# Patient Record
Sex: Male | Born: 1960
Health system: Southern US, Community
[De-identification: ages and names within clinical notes are randomized; demographics above are authoritative.]

## PROBLEM LIST (undated history)

## (undated) DIAGNOSIS — R630 Anorexia: Secondary | ICD-10-CM

## (undated) DIAGNOSIS — R350 Frequency of micturition: Secondary | ICD-10-CM

## (undated) DIAGNOSIS — E785 Hyperlipidemia, unspecified: Secondary | ICD-10-CM

## (undated) DIAGNOSIS — F329 Major depressive disorder, single episode, unspecified: Secondary | ICD-10-CM

## (undated) DIAGNOSIS — I739 Peripheral vascular disease, unspecified: Secondary | ICD-10-CM

## (undated) DIAGNOSIS — I639 Cerebral infarction, unspecified: Secondary | ICD-10-CM

## (undated) DIAGNOSIS — R0600 Dyspnea, unspecified: Secondary | ICD-10-CM

## (undated) DIAGNOSIS — M1990 Unspecified osteoarthritis, unspecified site: Secondary | ICD-10-CM

## (undated) DIAGNOSIS — R0609 Other forms of dyspnea: Secondary | ICD-10-CM

## (undated) DIAGNOSIS — I10 Essential (primary) hypertension: Secondary | ICD-10-CM

## (undated) DIAGNOSIS — R51 Headache: Secondary | ICD-10-CM

## (undated) DIAGNOSIS — H547 Unspecified visual loss: Secondary | ICD-10-CM

## (undated) DIAGNOSIS — E7800 Pure hypercholesterolemia, unspecified: Secondary | ICD-10-CM

## (undated) DIAGNOSIS — R519 Headache, unspecified: Secondary | ICD-10-CM

## (undated) DIAGNOSIS — F32A Depression, unspecified: Secondary | ICD-10-CM

## (undated) HISTORY — DX: Anorexia: R63.0

## (undated) HISTORY — DX: Headache: R51

## (undated) HISTORY — DX: Dyspnea, unspecified: R06.00

## (undated) HISTORY — DX: Unspecified visual loss: H54.7

## (undated) HISTORY — DX: Essential (primary) hypertension: I10

## (undated) HISTORY — DX: Depression, unspecified: F32.A

## (undated) HISTORY — DX: Cerebral infarction, unspecified: I63.9

## (undated) HISTORY — DX: Headache, unspecified: R51.9

## (undated) HISTORY — DX: Pure hypercholesterolemia, unspecified: E78.00

## (undated) HISTORY — DX: Other forms of dyspnea: R06.09

## (undated) HISTORY — DX: Major depressive disorder, single episode, unspecified: F32.9

## (undated) HISTORY — DX: Unspecified osteoarthritis, unspecified site: M19.90

## (undated) HISTORY — DX: Frequency of micturition: R35.0

## (undated) HISTORY — DX: Peripheral vascular disease, unspecified: I73.9

## (undated) HISTORY — DX: Hyperlipidemia, unspecified: E78.5

---

## 2005-09-18 ENCOUNTER — Ambulatory Visit: Payer: Self-pay | Admitting: Family Medicine

## 2010-11-29 ENCOUNTER — Encounter (INDEPENDENT_AMBULATORY_CARE_PROVIDER_SITE_OTHER): Payer: Medicaid Other | Admitting: Vascular Surgery

## 2010-11-29 DIAGNOSIS — I70219 Atherosclerosis of native arteries of extremities with intermittent claudication, unspecified extremity: Secondary | ICD-10-CM

## 2010-11-29 DIAGNOSIS — R630 Anorexia: Secondary | ICD-10-CM

## 2010-11-29 HISTORY — DX: Anorexia: R63.0

## 2010-12-01 NOTE — Consult Note (Signed)
NEW PATIENT CONSULTATION  Cameron Ware, Cameron Ware DOB:  08/19/1960                                       11/29/2010 OZHYQ#:[id number]  Patient is a 50 year old male patient with severe claudication symptoms in both legs, beginning in the hips and thighs extending down to the calves.  This limits him in walking less than 50 yards, he states. After rest, he is able to resume.  He has no history of gangrene, infection, cellulitis, or problems related to his circulation.  CHRONIC MEDICAL PROBLEMS: 1. Chronic tobacco abuse. 2. Hypertension. 3. Hyperlipidemia. 4. History of right brain stroke in October 2011.  Left arm and leg     weakness. 5. Negative for coronary artery disease or COPD.  SOCIAL HISTORY:  He is single, smokes a 1/2 to 1 pack of cigarettes per day, has done so for about 40 years.  Drinks occasional alcohol.  FAMILY HISTORY:  Positive for coronary artery disease in mother, who had a myocardial infarction.  Diabetes in his father.  Negative for stroke.  REVIEW OF SYSTEMS:  Positive for anorexia, headaches, decreased vision, arthritis, joint pain, muscle pain, occasional dyspnea on exertion, chest pain, depression, urinary frequency.  All other systems are negative in complete review of systems.  PHYSICAL EXAMINATION:  Blood pressure 117/60, heart rate 73, respirations 18.  General:  He is a thin, chronically ill-appearing male who is in no apparent distress, alert and oriented x3.  He has a strong tobacco odor.  HEENT:  Normal for age.  EOMs intact.  Lungs:  Clear to auscultation.  No rhonchi or wheezing.  Cardiovascular:  Regular rhythm. No murmurs.  Carotid pulses 3+.  No audible bruits.  Abdomen:  Soft, nontender with no masses.  Musculoskeletal:  Free of major deformities. Neurologic exam reveals some mild weakness the left side, upper and lower extremities.  Skin:  Free of rashes.  Lower extremity exam reveals 0 to 1+ femoral pulse on the  right, 1+ to possibly 2+ on the left.  No popliteal or distal pulses palpable.  Both feet are adequately perfused with no ischemia, ulceration, cellulitis, or gangrene.  He has had previous lower extremity arterial Dopplers of 0.8 on the right and 0.76 on the left, done May 23 of this year.  This patient has likely severe aortoiliac occlusive disease and femoral- popliteal occlusive disease with severe claudication.  We will attempt angiography via the left femoral approach, and if that is not feasible, then a CT angiogram will be done to further assess his situation, which likely is multiple-level involvement.    Quita Skye Hart [place], M.D. Electronically Signed  JDL/MEDQ  D:  11/29/2010  T:  12/01/2010  Job:  [id number]  cc:   Delaney Meigs, M.D.

## 2010-12-13 ENCOUNTER — Observation Stay (HOSPITAL_COMMUNITY)
Admission: RE | Admit: 2010-12-13 | Discharge: 2010-12-14 | Disposition: A | Discharge status: Home / Self Care | Payer: Medicaid Other | Source: Ambulatory Visit | Attending: Surgery | Admitting: Surgery

## 2010-12-13 ENCOUNTER — Ambulatory Visit (HOSPITAL_COMMUNITY): Payer: Medicaid Other

## 2010-12-13 DIAGNOSIS — I70219 Atherosclerosis of native arteries of extremities with intermittent claudication, unspecified extremity: Secondary | ICD-10-CM

## 2010-12-13 DIAGNOSIS — Z01810 Encounter for preprocedural cardiovascular examination: Secondary | ICD-10-CM | POA: Insufficient documentation

## 2010-12-13 LAB — POCT I-STAT, CHEM 8
Calcium, Ion: 1.12 mmol/L (ref 1.12–1.32)
Glucose, Bld: 94 mg/dL (ref 70–99)
HCT: 42 % (ref 39.0–52.0)
Hemoglobin: 14.3 g/dL (ref 13.0–17.0)
TCO2: 25 mmol/L (ref 0–100)

## 2010-12-14 LAB — CBC
MCH: 31.5 pg (ref 26.0–34.0)
MCHC: 35 g/dL (ref 30.0–36.0)
MCV: 89.9 fL (ref 78.0–100.0)
Platelets: 248 10*3/uL (ref 150–400)
RBC: 3.75 MIL/uL — ABNORMAL LOW (ref 4.22–5.81)
RDW: 12.2 % (ref 11.5–15.5)

## 2011-01-12 ENCOUNTER — Encounter: Payer: Self-pay | Admitting: Vascular Surgery

## 2011-01-16 ENCOUNTER — Ambulatory Visit: Payer: Medicaid Other | Admitting: Vascular Surgery

## 2011-01-25 NOTE — Op Note (Signed)
Cameron Ware, HIPPERT         ACCOUNT NO.:  [id number]  MEDICAL RECORD NO.:  [id number]  LOCATION:  2006                         FACILITY:  [facility]  PHYSICIAN:  Juleen China IV, MDDATE OF BIRTH:  05-01-1961  DATE OF PROCEDURE:  12/13/2010 DATE OF DISCHARGE:                              OPERATIVE REPORT   SURGEON: 1. Durene Cal IV, MD  PREOPERATIVE DIAGNOSIS:  Bilateral claudication.  POSTOPERATIVE DIAGNOSIS:  Bilateral claudication.  PROCEDURES PERFORMED: 1. Ultrasound-accessed left femoral artery. 2. Abdominal aortogram. 3. Bilateral lower extremity runoff. 4. Second-order catheterization. 5. Stent, right external iliac artery. 6. Stent, left external iliac artery. 7. Stent, left common iliac artery.  INDICATIONS:  This is a 50 year old gentleman with lifestyle-limiting claudication.  He comes today for study and possible intervention.  PROCEDURE:  The patient was identified in the holding area and taken to room #8, placed supine on the table.  Both groins were prepped and draped in usual fashion.  A time-out was called.  The left femoral artery was evaluated with ultrasound and found to be widely patent.  It was accessed under ultrasound guidance.  An 0-035 wire was advanced into the aorta under fluoroscopic visualization.  A 5-French sheath was placed.  Over the wire, an Omniflush catheter was advanced at the of the level L1.  Abdominal aortogram was obtained.  Next, catheter was pulled down at the aortic bifurcation.  Bilateral runoff was performed.  FINDINGS:  Aortogram:  The visualized portions of the suprarenal abdominal aorta showed no significant disease.  There are single renal arteries bilaterally, which are widely patent.  The infrarenal abdominal aorta is widely patent.  The right common iliac artery is widely patent. There is moderate luminal narrowing in the external iliac artery at its proximal extent.  There is diffuse disease within the left  external iliac artery.  Right lower extremity:  The right common femoral artery is widely patent.  The right profunda femoral artery is widely patent.  The right superficial femoral artery is widely patent.  There is mild stenosis in the adductor canal.  Dominant runoff is via the anterior tibial artery.  Left lower extremity:  There is focal stenosis within the left common femoral artery.  The profunda femoral artery is patent.  The superficial femoral artery is patent, two-vessel runoff via the anterior tibial and peroneal.  At this point in time although the patient does have somewhat small arteries, I first decided to proceed with iliac intervention.  I crossed over the bifurcation with an Omniflush catheter and Bentson wire and advanced a 7-French sheath into the right common iliac artery and took an oblique picture, which identified approximately a 70% stenosis at the origin of the external iliac artery.  I did proceed with full heparinization of the patient.  I elected the primary stent this lesion and 8 x 30 Abbott Absolute Pro was deployed in this area and then molded to confirmation with a 6-mm balloon.  Completion study revealed resolution of the stenosis.  Next, attention was turned towards the left side of the inflow.  There is disease appeared to go from the common iliac into the external iliac artery.  I elected to treat these using an 8 x  80 Absolute Pro stent.  This is an older confirmation with 6-mm balloon.  Completion angiogram revealed resolution of the stenosis.  At this point, catheters and wires were removed.  The patient was taken to the holding area for sheath pull.  IMPRESSION: 1. Successful stenting of the right external iliac artery with an 8 x     30 Absolute Pro. 2. Successful stenting of the left common and external iliac artery     using an Abbott Absolute Pro 8 x 80 self-expanding stent. 3. Left common femoral stenosis.     [name], MD     VWB/MEDQ  D:  12/13/2010  T:  12/13/2010  Job:  [id number]  Electronically Signed by Arelia Longest IV MD on 01/25/2011 11:51:00 PM

## 2011-01-31 ENCOUNTER — Ambulatory Visit: Payer: Medicaid Other | Admitting: Vascular Surgery

## 2011-02-06 ENCOUNTER — Encounter: Payer: Self-pay | Admitting: Vascular Surgery

## 2011-02-07 ENCOUNTER — Encounter: Payer: Self-pay | Admitting: Vascular Surgery

## 2011-02-07 ENCOUNTER — Encounter (INDEPENDENT_AMBULATORY_CARE_PROVIDER_SITE_OTHER): Payer: Medicaid Other | Admitting: *Deleted

## 2011-02-07 ENCOUNTER — Ambulatory Visit (INDEPENDENT_AMBULATORY_CARE_PROVIDER_SITE_OTHER): Payer: Medicaid Other | Admitting: Vascular Surgery

## 2011-02-07 VITALS — BP 137/73 | HR 68 | Resp 18 | Ht 65.0 in | Wt 130.0 lb

## 2011-02-07 DIAGNOSIS — I739 Peripheral vascular disease, unspecified: Secondary | ICD-10-CM

## 2011-02-07 DIAGNOSIS — Z48812 Encounter for surgical aftercare following surgery on the circulatory system: Secondary | ICD-10-CM

## 2011-02-07 DIAGNOSIS — I6529 Occlusion and stenosis of unspecified carotid artery: Secondary | ICD-10-CM

## 2011-02-07 NOTE — Progress Notes (Signed)
Addended by: Sharee Pimple on: 02/07/2011 04:26 PM   Modules accepted: Orders

## 2011-02-07 NOTE — Progress Notes (Signed)
Subjective:     [name], male   DOB: [birth year], 50 y.o.   MRN: [id number]  HPI this 50 year old male patient has claudication symptoms in both hips thighs and calves. He has a long history of tobacco abuse. He underwent stenting of his left external and common iliac artery and his right external iliac artery by Dr.brabham 12/13/2010. He has had some improvement in his symptomatology. Both superficial femoral arteries are patent with mild disease. He has some degree of discomfort in his legs at rest which I explained is not vascular in origin. He also has typical claudication symptoms walking up hills or up steps or for any long distance. He does state he can walk for approximately 30 minutes. He states he is trying to quit smoking.  Past Medical History  Diagnosis Date  . Hypertension   . Hyperlipidemia   . Stroke   . Arthritis   . Anorexia 11/29/2010  . Generalized headaches   . Decreased vision   . Dyspnea on exertion   . Chest pain   . Depression   . Urinary frequency     History  Substance Use Topics  . Smoking status: Current Everyday Smoker -- 1.0 packs/day for 40 years    Types: Cigarettes  . Smokeless tobacco: Never Used  . Alcohol Use: Yes     OCCASIONALLY    Family History  Problem Relation Age of Onset  . Heart disease Mother   . Diabetes Father     Allergies no known allergies  Current outpatient prescriptions:aspirin 325 MG tablet, Take 325 mg by mouth daily.  , Disp: , Rfl: ;  clopidogrel (PLAVIX) 75 MG tablet, Take 75 mg by mouth daily.  , Disp: , Rfl: ;  Omeprazole (PRILOSEC PO), Take by mouth.  , Disp: , Rfl: ;  pravastatin (PRAVACHOL) 40 MG tablet, Take 40 mg by mouth daily.  , Disp: , Rfl: ;  celecoxib (CELEBREX) 200 MG capsule, Take 200 mg by mouth 2 (two) times daily.  , Disp: , Rfl:  HYDROcodone-acetaminophen (VICODIN) 5-500 MG per tablet, Take 1 tablet by mouth every 8 (eight) hours as needed.  , Disp: , Rfl:   BP 137/73   Pulse 68  Resp 18  Ht 5\' 5"  (1.651 m)  Wt 130 lb (58.968 kg)  BMI 21.63 kg/m2  Body mass index is 21.63 kg/(m^2).       Review of Systems denies chest pain dyspnea on exertion PND orthopnea. Continues to have some mild left-sided weakness due to previous right brain stroke. Planes of discomfort and from his knees to his feet bilaterally at rest and with ambulation. All other systems are negative.     Objective:   Physical Exam blood pressure 137/73 heart rate 68 respirations 18 Is alert and oriented x3 in no apparent distress Chest good auscultation no rhonchi or wheezing Cardiovascular exam regular rhythm no murmurs Abdomen soft nontender with no masses Lower extremity exam reveals 2+ femoral pulses bilaterally with no popliteal or distal pulses palpable Neurologic exam normal  Today out ordered lower string the arterial Dopplers which revealed ABI of 0.8 on the right and 0.72 on the left very similar to his preoperative status.    Assessment:    status post bilateral iliac stents July 2012. Some improvement in symptoms. Continued ongoing tobacco abuse. History of right brain CVA.    Plan:     Return in 6 months with lower extremity arterial Dopplers and duplex scan of his iliac  stents as well as carotid duplex study.

## 2011-05-17 ENCOUNTER — Other Ambulatory Visit: Payer: Self-pay | Admitting: *Deleted

## 2011-05-17 DIAGNOSIS — I70219 Atherosclerosis of native arteries of extremities with intermittent claudication, unspecified extremity: Secondary | ICD-10-CM

## 2011-05-17 MED ORDER — CLOPIDOGREL BISULFATE 75 MG PO TABS: 75.0000 mg | ORAL_TABLET | Freq: Every day | ORAL | Status: DC

## 2011-06-12 DIAGNOSIS — I6529 Occlusion and stenosis of unspecified carotid artery: Secondary | ICD-10-CM

## 2011-08-07 ENCOUNTER — Encounter: Payer: Self-pay | Admitting: Vascular Surgery

## 2011-08-08 ENCOUNTER — Other Ambulatory Visit: Payer: PRIVATE HEALTH INSURANCE

## 2011-08-08 ENCOUNTER — Ambulatory Visit (INDEPENDENT_AMBULATORY_CARE_PROVIDER_SITE_OTHER): Payer: Medicaid Other | Admitting: Vascular Surgery

## 2011-08-08 ENCOUNTER — Ambulatory Visit: Payer: Medicaid Other | Admitting: Vascular Surgery

## 2011-08-08 ENCOUNTER — Other Ambulatory Visit: Payer: Medicaid Other

## 2011-08-10 ENCOUNTER — Ambulatory Visit (INDEPENDENT_AMBULATORY_CARE_PROVIDER_SITE_OTHER): Payer: Medicaid Other | Admitting: Vascular Surgery

## 2011-08-10 DIAGNOSIS — I739 Peripheral vascular disease, unspecified: Secondary | ICD-10-CM

## 2011-08-10 DIAGNOSIS — Z48812 Encounter for surgical aftercare following surgery on the circulatory system: Secondary | ICD-10-CM

## 2011-08-10 DIAGNOSIS — I70219 Atherosclerosis of native arteries of extremities with intermittent claudication, unspecified extremity: Secondary | ICD-10-CM

## 2011-08-10 DIAGNOSIS — I6529 Occlusion and stenosis of unspecified carotid artery: Secondary | ICD-10-CM

## 2011-08-10 NOTE — Progress Notes (Signed)
Carotid duplex performed @ [facility] 08/10/2011

## 2011-08-10 NOTE — Progress Notes (Signed)
Ankle brachial index performed @ [facility] 08/10/2011

## 2011-08-10 NOTE — Progress Notes (Signed)
Bilateral lower extremity arterial duplex performed @ [facility] 08/10/2011

## 2011-08-10 NOTE — Procedures (Unsigned)
LOWER EXTREMITY ARTERIAL DUPLEX  INDICATION:  Claudication, peripheral vascular disease.  HISTORY: Diabetes:  No. Cardiac:  No. Hypertension:  Yes. Smoking:  Currently. Previous Surgery:  No arterial intervention below the iliac level.  SINGLE LEVEL ARTERIAL EXAM                         RIGHT                LEFT Brachial: Anterior tibial: Posterior tibial: Peroneal: Ankle/Brachial Index:   0.61                 0.63.  PREVIOUS ANKLE BRACHIAL INDEX:  Date:  02/07/2011  Right: 0.80  Left: 0.72   LOWER EXTREMITY ARTERIAL DUPLEX EXAM   DUPLEX:  Heterogenous plaque present with elevated velocities >200 cm/s, suggestive of 50% to 75% stenosis, involving the right profunda femoral, proximal / mid superficial femoral, left common femoral artery, left proximal superficial femoral artery, and  left proximal / mid superficial femoral artery.  IMPRESSION: 1. Native artery stenosis present, as noted above. 2. Bilateral ankle brachial indices are in the moderate claudication     range. 3. Bilateral ankle brachial indices have decreased since the previous     study on 02/07/2011.  ___________________________________________ Quita Skye. Hart [place], M.D.  SH/MEDQ  D:  08/10/2011  T:  08/10/2011  Job:  [id number]

## 2011-08-11 ENCOUNTER — Encounter: Payer: Self-pay | Admitting: Vascular Surgery

## 2011-08-11 ENCOUNTER — Other Ambulatory Visit: Payer: PRIVATE HEALTH INSURANCE

## 2011-08-14 ENCOUNTER — Ambulatory Visit (INDEPENDENT_AMBULATORY_CARE_PROVIDER_SITE_OTHER): Payer: Medicaid Other | Admitting: Vascular Surgery

## 2011-08-14 ENCOUNTER — Encounter: Payer: Self-pay | Admitting: Vascular Surgery

## 2011-08-14 VITALS — BP 128/74 | HR 81 | Resp 18 | Ht 70.0 in | Wt 130.0 lb

## 2011-08-14 DIAGNOSIS — I6529 Occlusion and stenosis of unspecified carotid artery: Secondary | ICD-10-CM | POA: Insufficient documentation

## 2011-08-14 DIAGNOSIS — I70219 Atherosclerosis of native arteries of extremities with intermittent claudication, unspecified extremity: Secondary | ICD-10-CM | POA: Insufficient documentation

## 2011-08-14 NOTE — Progress Notes (Signed)
Subjective:     [name], male   DOB: 02/09/1961, 50 y.o.   MRN: [id number]  HPI this 58-year-old male returns 8 months post PTA and stenting of the left external and common iliac arteries and right external iliac artery by Dr. Brabham. He has severe claudication symptoms in the buttocks thigh and calf. These improved slightly but now returned to their baseline or worsens. He also has some degree of rest discomfort in his calves he states. He has no history of cellulitis, gangrene, nonhealing ulcers, or infection. He continues to smoke cigarettes but states he has only had one in the past week  Past Medical History  Diagnosis Date  . Hypertension   . Hyperlipidemia   . Stroke   . Arthritis   . Anorexia 11/29/2010  . Generalized headaches   . Decreased vision   . Dyspnea on exertion   . Chest pain   . Depression   . Urinary frequency     History  Substance Use Topics  . Smoking status: Current Everyday Smoker -- 1.0 packs/day for 40 years    Types: Cigarettes  . Smokeless tobacco: Never Used  . Alcohol Use: Yes     OCCASIONALLY    Family History  Problem Relation Age of Onset  . Heart disease Mother   . Diabetes Father     No Known Allergies  Current outpatient prescriptions:aspirin 325 MG tablet, Take 325 mg by mouth daily.  , Disp: , Rfl: ;  celecoxib (CELEBREX) 200 MG capsule, Take 200 mg by mouth 2 (two) times daily.  , Disp: , Rfl: ;  clopidogrel (PLAVIX) 75 MG tablet, Take 1 tablet (75 mg total) by mouth daily., Disp: 30 tablet, Rfl: 6;  HYDROcodone-acetaminophen (VICODIN) 5-500 MG per tablet, Take 1 tablet by mouth every 8 (eight) hours as needed.  , Disp: , Rfl:  pravastatin (PRAVACHOL) 40 MG tablet, Take 40 mg by mouth daily.  , Disp: , Rfl:   BP 128/74  Pulse 81  Resp 18  Ht 5' 10" (1.778 m)  Wt 130 lb (58.968 kg)  BMI 18.65 kg/m2  Body mass index is 18.65 kg/(m^2).        Review of Systems he denies chest pain, dyspnea on  exertion, PND, orthopnea. He has no active neurologic symptoms. He has severe left shoulder discomfort. All other systems are negative and complete review of systems    Objective:   Physical Exam blood pressure 120/74 heart rate 81 respirations 18 Gen.-alert and oriented x3 in no apparent distress-smells heavily of tobacco  HEENT normal for age Lungs no rhonchi or wheezing Cardiovascular regular rhythm no murmurs carotid pulses 3+ palpable no bruits audible Abdomen soft nontender no palpable masses Musculoskeletal free of  major deformities Skin clear -no rashes Neurologic normal Lower extremities 1-2+ femoral pulses palpable bilaterally right greater than left. No popliteal or distal pulses palpable. No evidence of active infection or cellulitis or gangrene.  Today I ordered lower extremity arterial Dopplers which revealed severe right common iliac stenosis with a high velocity 467 cm/s and a relatively severe left common iliac artery stenosis a 379 cm/s. ABI on the right has decreased from 0.8 0.61 on the left is decreased from 0.72-0.63  Also ordered a carotid duplex exam which are reviewed and interpreted. He has mild occlusive disease bilaterally with no severe stenosis.       Assessment:     Recurrent severe occlusive disease in both iliac systems with limiting claudication Ongoing   tobacco abuse    Plan:     Patient would like to proceed with a repeat angiogram to see if further PTA and stenting would be feasible. Schedule this for Dr. Brabham on Tuesday, March 26      

## 2011-08-15 ENCOUNTER — Encounter (HOSPITAL_COMMUNITY): Payer: Self-pay | Admitting: Pharmacy Technician

## 2011-08-18 ENCOUNTER — Other Ambulatory Visit: Payer: Self-pay

## 2011-08-21 MED ORDER — SODIUM CHLORIDE 0.9 % IV SOLN
INTRAVENOUS | Status: DC
  Administered 2011-08-22: 1000 mL via INTRAVENOUS

## 2011-08-21 NOTE — Procedures (Unsigned)
AORTA-ILIAC DUPLEX EVALUATION  INDICATION:  Peripheral vascular disease.  HISTORY: Diabetes:  No. Cardiac:  No. Hypertension:  Yes. Smoking:  Currently. Previous Surgery:  Right external iliac, left common iliac and external iliac artery stents placed, 12/13/2010.              SINGLE LEVEL ARTERIAL EXAM                             RIGHT                  LEFT Brachial: Anterior tibial: Posterior tibial: Peroneal: Ankle/brachial index:      0.61                   0.63 Previous ABI/date: 02/07/2011                      0.80 0.72  AORTA-ILIAC DUPLEX EXAM Aorta - Proximal     65 cm/s Aorta - Mid          55 cm/s Aorta - Distal       54 cm/s  RIGHT                                   LEFT 148 cm/s          CIA-PROXIMAL          128 cm/s                   467 cm/s    CIA-DISTAL  379 cm/s - M/ 356 - D 128 cm/s          HYPOGASTRIC           329 cm/s 156 cm/s          EIA-PROXIMAL          433 cm/s 125 cm/s          EIA-MID               180 cm/s 147 cm/s          EIA-DISTAL            158 cm/s  IMPRESSION: 1. Patent aorta with heterogenous plaque present throughout. 2. Right common iliac artery stenosis present of >75% at the distal     segment. 3. Patent right hypogastric and external iliac arteries. 4. Left common iliac artery presents with elevated velocities >350     cm/s with a history of stent placement; however, velocities may     still be suggestive of stenosis. 5. Left external iliac artery presents with elevated velocities >400     cm/s, which may be suggestive of hemodynamically significant     stenosis with history of stent placement present.     ___________________________________________ Cameron Ware. Hart [place], M.D.  SH/MEDQ  D:  08/10/2011  T:  08/10/2011  Job:  [id number]

## 2011-08-21 NOTE — Procedures (Unsigned)
CAROTID DUPLEX EXAM  INDICATION:  Carotid stenosis.  HISTORY: Diabetes:  No. Cardiac:  No. Hypertension:  Yes. Smoking:  Currently. Previous Surgery:  No carotid interventions. CV History:  Currently asymptomatic. Amaurosis Fugax No, Paresthesias No, Hemiparesis No.                                      RIGHT             LEFT Brachial systolic pressure:         151               139 Brachial Doppler waveforms:         WNL               WNL Vertebral direction of flow:        Antegrade         Antegrade DUPLEX VELOCITIES (cm/sec) CCA peak systolic                   94                90 ECA peak systolic                   88                71 ICA peak systolic                   78                95 ICA end diastolic                   30                29 PLAQUE MORPHOLOGY:                  Calcified         Heterogenous PLAQUE AMOUNT:                      Mild              Mild PLAQUE LOCATION:                    CCA/ICA           CCA/ICA  IMPRESSION: 1. Bilateral internal carotid artery stenosis present in the 1% to 39%     range. 2. Patent bilateral external carotid arteries. 3. Bilateral patent and antegrade vertebral arteries.  ___________________________________________ Quita Skye Hart [place], M.D.  SH/MEDQ  D:  08/10/2011  T:  08/10/2011  Job:  [id number]

## 2011-08-22 ENCOUNTER — Other Ambulatory Visit: Payer: Self-pay

## 2011-08-22 ENCOUNTER — Ambulatory Visit: Payer: Medicaid Other | Admitting: Vascular Surgery

## 2011-08-22 ENCOUNTER — Encounter (HOSPITAL_COMMUNITY)
Admission: RE | Disposition: A | Discharge status: Home / Self Care | Payer: Self-pay | Source: Ambulatory Visit | Attending: Surgery

## 2011-08-22 ENCOUNTER — Other Ambulatory Visit: Payer: Self-pay | Admitting: *Deleted

## 2011-08-22 ENCOUNTER — Ambulatory Visit (HOSPITAL_COMMUNITY)
Admission: RE | Admit: 2011-08-22 | Discharge: 2011-08-22 | Disposition: A | Discharge status: Home / Self Care | Payer: Medicaid Other | Source: Ambulatory Visit | Attending: Surgery | Admitting: Surgery

## 2011-08-22 DIAGNOSIS — I70219 Atherosclerosis of native arteries of extremities with intermittent claudication, unspecified extremity: Secondary | ICD-10-CM

## 2011-08-22 DIAGNOSIS — F17200 Nicotine dependence, unspecified, uncomplicated: Secondary | ICD-10-CM | POA: Insufficient documentation

## 2011-08-22 DIAGNOSIS — R630 Anorexia: Secondary | ICD-10-CM | POA: Insufficient documentation

## 2011-08-22 DIAGNOSIS — Z8673 Personal history of transient ischemic attack (TIA), and cerebral infarction without residual deficits: Secondary | ICD-10-CM | POA: Insufficient documentation

## 2011-08-22 DIAGNOSIS — I10 Essential (primary) hypertension: Secondary | ICD-10-CM | POA: Insufficient documentation

## 2011-08-22 DIAGNOSIS — E785 Hyperlipidemia, unspecified: Secondary | ICD-10-CM | POA: Insufficient documentation

## 2011-08-22 HISTORY — PX: OTHER SURGICAL HISTORY: SHX169

## 2011-08-22 HISTORY — PX: ABDOMINAL AORTAGRAM: SHX5454

## 2011-08-22 HISTORY — PX: PERCUTANEOUS STENT INTERVENTION: SHX5500

## 2011-08-22 LAB — POCT I-STAT, CHEM 8
HCT: 45 % (ref 39.0–52.0)
Hemoglobin: 15.3 g/dL (ref 13.0–17.0)
Sodium: 139 mEq/L (ref 135–145)
TCO2: 23 mmol/L (ref 0–100)

## 2011-08-22 LAB — POCT ACTIVATED CLOTTING TIME: Activated Clotting Time: 221 seconds

## 2011-08-22 SURGERY — ABDOMINAL AORTAGRAM
Anesthesia: LOCAL

## 2011-08-22 MED ORDER — HYDRALAZINE HCL 20 MG/ML IJ SOLN: 10.0000 mg | INTRAMUSCULAR | Status: DC | PRN

## 2011-08-22 MED ORDER — PHENOL 1.4 % MT LIQD: 1.0000 | OROMUCOSAL | Status: DC | PRN

## 2011-08-22 MED ORDER — SODIUM CHLORIDE 0.9 % IV SOLN: INTRAVENOUS | Status: DC

## 2011-08-22 MED ORDER — HEPARIN SODIUM (PORCINE) 1000 UNIT/ML IJ SOLN
INTRAMUSCULAR | Status: AC
  Filled 2011-08-22: qty 1

## 2011-08-22 MED ORDER — LIDOCAINE HCL (PF) 1 % IJ SOLN
INTRAMUSCULAR | Status: AC
  Filled 2011-08-22: qty 30

## 2011-08-22 MED ORDER — ONDANSETRON HCL 4 MG/2ML IJ SOLN: 4.0000 mg | Freq: Four times a day (QID) | INTRAMUSCULAR | Status: DC | PRN

## 2011-08-22 MED ORDER — ACETAMINOPHEN 325 MG PO TABS: 325.0000 mg | ORAL_TABLET | ORAL | Status: DC | PRN

## 2011-08-22 MED ORDER — ACETAMINOPHEN 325 MG RE SUPP: 325.0000 mg | RECTAL | Status: DC | PRN

## 2011-08-22 MED ORDER — HEPARIN (PORCINE) IN NACL 2-0.9 UNIT/ML-% IJ SOLN
INTRAMUSCULAR | Status: AC
  Filled 2011-08-22: qty 1000

## 2011-08-22 MED ORDER — FENTANYL CITRATE 0.05 MG/ML IJ SOLN
INTRAMUSCULAR | Status: AC
  Filled 2011-08-22: qty 2

## 2011-08-22 MED ORDER — MORPHINE SULFATE 10 MG/ML IJ SOLN: 2.0000 mg | INTRAMUSCULAR | Status: DC | PRN

## 2011-08-22 MED ORDER — METOPROLOL TARTRATE 1 MG/ML IV SOLN: 2.0000 mg | INTRAVENOUS | Status: DC | PRN

## 2011-08-22 MED ORDER — OXYCODONE-ACETAMINOPHEN 5-325 MG PO TABS: 1.0000 | ORAL_TABLET | ORAL | Status: DC | PRN

## 2011-08-22 MED ORDER — MIDAZOLAM HCL 2 MG/2ML IJ SOLN
INTRAMUSCULAR | Status: AC
  Filled 2011-08-22: qty 2

## 2011-08-22 MED ORDER — LABETALOL HCL 5 MG/ML IV SOLN: 10.0000 mg | INTRAVENOUS | Status: DC | PRN

## 2011-08-22 NOTE — Discharge Instructions (Signed)

## 2011-08-22 NOTE — Op Note (Signed)
[facility]  Patient name: Cameron Ware MRN: [id number] DOB: 1961-03-02 Sex: male  08/22/2011 Pre-operative Diagnosis: Bilateral claudication Post-operative diagnosis:  Same Surgeon:  [name] Procedure Performed:  1.  ultrasound access left femoral artery  2.  abdominal aortogram  3.  bilateral lower extremity runoff  4.  second order catheterization  5.  stent left common iliac artery  6.  stent right common iliac artery    Indications:  The patient has a history of right external iliac and left common and external iliac stenting. This was done for claudication. He has had recurrence of his symptoms. Ultrasound has identified disease within his iliac system. He comes in today for diagnostic procedure and possible intervention  Procedure:  The patient was identified in the holding area and taken to room 8.  The patient was then placed supine on the table and prepped and draped in the usual sterile fashion.  A time out was called.  Ultrasound was used to evaluate the left common femoral artery.  It was patent .  A digital ultrasound image was acquired.  A micropuncture needle was used to access the left common femoral artery under ultrasound guidance.  An 018 wire was advanced without resistance and a micropuncture sheath was placed.  The 018 wire was removed and a benson wire was placed.  The micropuncture sheath was exchanged for a 5 french sheath.  An omniflush catheter was advanced over the wire to the level of L-1.  An abdominal angiogram was obtained. Next the catheter was pulled down to the aortic bifurcation and bilateral lower extremity runoff was performed Findings:   Aortogram:  The visualized portions of the suprarenal abdominal aorta showed no significant disease. They're single renal arteries bilaterally which are widely patent. Diffuse disease with hemodynamically significant stenosis within the right common iliac artery. The  right external iliac stent is widely patent. There is a focal stenosis within the left common iliac artery previously deployed stent at its proximal end.  Right Lower Extremity:  The common femoral artery is patent throughout it's course however the origin of the profunda does not opacify suggesting occlusion. The superficial femoral artery is patent throughout it's course with several areas of approximately 40% stenosis. The popliteal artery is patent throughout is course. There is single-vessel runoff via the peroneal artery.  Left Lower Extremity: The left common femoral artery is diffusely diseased. The profunda femoral artery is also diffusely diseased. The superficial femoral artery is patent as is the popliteal artery. There is 2 vessel runoff via the anterior tibial and peroneal artery.  Intervention:  After the above images were obtained the decision was made to intervene. The Omni flush catheter and Bentson wire were used to cross the aortic bifurcation. I then removed the Omni flush catheter and 5 French sheath and upsized to a 7 [place] Ansel 1 sheath. At this point the patient was fully heparinized. I elected to primarily stent the lesion within the right common iliac artery. This was done using an 8 x 38 atrium balloon expandable stent taken to 8 atmospheres. Completion study revealed resolution of the right common iliac stenosis. Attention was then turned towards the left common iliac stenosis. This was within the proximal portion of the previously deployed stent. I again elected to primarily treat this lesion by stenting. An 8 x 38 atrium balloon expandable stent was deployed. It was taken to 8 atmospheres. Completion arteriogram revealed resolution of the stenosis. There did  appear to be a focal dissection at the distal end of the covered stent in the midportion of the self-expanding stent. I reinserted the balloon and taken to 6 atmospheres and held for 90 seconds. There was significant change  on completion study. I did not feel this lesion was hemodynamically significant. There is no evidence of extravasation and therefore a elected to not address this any further. With this the catheters and wires were removed the patient taken to the holding area for sheath pull once his coagulation profile corrects.  Impression:  #1  successful stenting of the the [place] stenosis within the right common iliac artery using an atrium 8 x 38 covered balloon expandable stent  #2  successful stenting of the left common iliac artery using an atrium 8 x 38 covered balloon expandable stent  #3  severe right profundofemoral disease  #4  severe left common femoral disease     V. Durene Cal, M.D. [facility] Office: [phone] Pager:  [phone]

## 2011-08-22 NOTE — Interval H&P Note (Signed)
History and Physical Interval Note:  08/22/2011 9:41 AM  Cameron Ware  has presented today for surgery, with the diagnosis of PVD  The various methods of treatment have been discussed with the patient and family. After consideration of risks, benefits and other options for treatment, the patient has consented to  Procedure(s) (LRB): ABDOMINAL AORTAGRAM (N/A) as a surgical intervention .  The patients' history has been reviewed, patient examined, no change in status, stable for surgery.  I have reviewed the patients' chart and labs.  Questions were answered to the patient's satisfaction.     Agusta Hackenberg IV, V. WELLS

## 2011-08-22 NOTE — H&P (View-Only) (Signed)
Subjective:     [name], male   DOB: [birth year], 51 y.o.   MRN: [id number]  HPI this 51 year old male returns 8 months post PTA and stenting of the left external and common iliac arteries and right external iliac artery by Dr. Myra Gianotti. He has severe claudication symptoms in the buttocks thigh and calf. These improved slightly but now returned to their baseline or worsens. He also has some degree of rest discomfort in his calves he states. He has no history of cellulitis, gangrene, nonhealing ulcers, or infection. He continues to smoke cigarettes but states he has only had one in the past week  Past Medical History  Diagnosis Date  . Hypertension   . Hyperlipidemia   . Stroke   . Arthritis   . Anorexia 11/29/2010  . Generalized headaches   . Decreased vision   . Dyspnea on exertion   . Chest pain   . Depression   . Urinary frequency     History  Substance Use Topics  . Smoking status: Current Everyday Smoker -- 1.0 packs/day for 40 years    Types: Cigarettes  . Smokeless tobacco: Never Used  . Alcohol Use: Yes     OCCASIONALLY    Family History  Problem Relation Age of Onset  . Heart disease Mother   . Diabetes Father     No Known Allergies  Current outpatient prescriptions:aspirin 325 MG tablet, Take 325 mg by mouth daily.  , Disp: , Rfl: ;  celecoxib (CELEBREX) 200 MG capsule, Take 200 mg by mouth 2 (two) times daily.  , Disp: , Rfl: ;  clopidogrel (PLAVIX) 75 MG tablet, Take 1 tablet (75 mg total) by mouth daily., Disp: 30 tablet, Rfl: 6;  HYDROcodone-acetaminophen (VICODIN) 5-500 MG per tablet, Take 1 tablet by mouth every 8 (eight) hours as needed.  , Disp: , Rfl:  pravastatin (PRAVACHOL) 40 MG tablet, Take 40 mg by mouth daily.  , Disp: , Rfl:   BP 128/74  Pulse 81  Resp 18  Ht 5\' 10"  (1.778 m)  Wt 130 lb (58.968 kg)  BMI 18.65 kg/m2  Body mass index is 18.65 kg/(m^2).        Review of Systems he denies chest pain, dyspnea on  exertion, PND, orthopnea. He has no active neurologic symptoms. He has severe left shoulder discomfort. All other systems are negative and complete review of systems    Objective:   Physical Exam blood pressure 120/74 heart rate 81 respirations 18 Gen.-alert and oriented x3 in no apparent distress-smells heavily of tobacco  HEENT normal for age Lungs no rhonchi or wheezing Cardiovascular regular rhythm no murmurs carotid pulses 3+ palpable no bruits audible Abdomen soft nontender no palpable masses Musculoskeletal free of  major deformities Skin clear -no rashes Neurologic normal Lower extremities 1-2+ femoral pulses palpable bilaterally right greater than left. No popliteal or distal pulses palpable. No evidence of active infection or cellulitis or gangrene.  Today I ordered lower extremity arterial Dopplers which revealed severe right common iliac stenosis with a high velocity 467 cm/s and a relatively severe left common iliac artery stenosis a 379 cm/s. ABI on the right has decreased from 0.8 0.61 on the left is decreased from 0.72-0.63  Also ordered a carotid duplex exam which are reviewed and interpreted. He has mild occlusive disease bilaterally with no severe stenosis.       Assessment:     Recurrent severe occlusive disease in both iliac systems with limiting claudication Ongoing  tobacco abuse    Plan:     Patient would like to proceed with a repeat angiogram to see if further PTA and stenting would be feasible. Schedule this for Dr. Myra Gianotti on Tuesday, March 26

## 2011-08-23 LAB — POCT ACTIVATED CLOTTING TIME: Activated Clotting Time: 166 seconds

## 2011-09-28 DIAGNOSIS — I6529 Occlusion and stenosis of unspecified carotid artery: Secondary | ICD-10-CM

## 2011-09-28 DIAGNOSIS — I70219 Atherosclerosis of native arteries of extremities with intermittent claudication, unspecified extremity: Secondary | ICD-10-CM

## 2011-10-16 ENCOUNTER — Encounter: Payer: Self-pay | Admitting: Vascular Surgery

## 2011-10-17 ENCOUNTER — Encounter (INDEPENDENT_AMBULATORY_CARE_PROVIDER_SITE_OTHER): Payer: Medicaid Other | Admitting: *Deleted

## 2011-10-17 ENCOUNTER — Ambulatory Visit (INDEPENDENT_AMBULATORY_CARE_PROVIDER_SITE_OTHER): Payer: Medicaid Other | Admitting: Vascular Surgery

## 2011-10-17 ENCOUNTER — Encounter: Payer: Self-pay | Admitting: Vascular Surgery

## 2011-10-17 VITALS — BP 122/81 | HR 73 | Resp 16 | Ht 64.0 in | Wt 133.0 lb

## 2011-10-17 DIAGNOSIS — I70219 Atherosclerosis of native arteries of extremities with intermittent claudication, unspecified extremity: Secondary | ICD-10-CM

## 2011-10-17 DIAGNOSIS — I739 Peripheral vascular disease, unspecified: Secondary | ICD-10-CM

## 2011-10-17 NOTE — Progress Notes (Signed)
Subjective:     [name], male   DOB: [birth year], 51 y.o.   MRN: [id number]  HPI this 51 year old male returns for initial followup 1 month post bilateral iliac PTA and stenting performed by Dr. Myra Gianotti. Patient has severe diffuse bilateral iliac occlusive disease. He has a long history of tobacco abuse which continues although he states he has decreased his smoking significantly. His ambulation has improved but he continues to have discomfort after walking about 50-100 feet he states. He does not have rest pain and nonhealing ulcers infection gangrene and cellulitis et Karie Soda. He denies any recent chest pain or dyspnea on exertion.  Past Medical History  Diagnosis Date  . Hypertension   . Hyperlipidemia   . Stroke   . Arthritis   . Anorexia 11/29/2010  . Generalized headaches   . Decreased vision   . Dyspnea on exertion   . Chest pain   . Depression   . Urinary frequency     History  Substance Use Topics  . Smoking status: Current Everyday Smoker -- 1.0 packs/day for 40 years    Types: Cigarettes  . Smokeless tobacco: Never Used  . Alcohol Use: Yes     OCCASIONALLY    Family History  Problem Relation Age of Onset  . Heart disease Mother   . Diabetes Father     No Known Allergies  Current outpatient prescriptions:aspirin 325 MG tablet, Take 325 mg by mouth daily.  , Disp: , Rfl: ;  celecoxib (CELEBREX) 200 MG capsule, Take 200 mg by mouth 2 (two) times daily.  , Disp: , Rfl: ;  clopidogrel (PLAVIX) 75 MG tablet, Take 1 tablet (75 mg total) by mouth daily., Disp: 30 tablet, Rfl: 6;  HYDROcodone-acetaminophen (VICODIN) 5-500 MG per tablet, Take 1 tablet by mouth every 8 (eight) hours as needed. For pain, Disp: , Rfl:  Pitavastatin Calcium (LIVALO) 4 MG TABS, Take 4 mg by mouth daily., Disp: , Rfl: ;  DISCONTD: pravastatin (PRAVACHOL) 40 MG tablet, Take 40 mg by mouth daily.  , Disp: , Rfl:   BP 122/81  Pulse 73  Resp 16  Ht 5\' 4"  (1.626 m)  Wt 133 lb  (60.328 kg)  BMI 22.83 kg/m2  Body mass index is 22.83 kg/(m^2).         Review of Systems     Objective:   Physical Exam blood pressure 120/81 heart rate 73 respirations 16 Gen. chronically ill-appearing male in no apparent stress alert and oriented x3 Lungs no rhonchi or wheezing HEENT normal for age Cardiovascular regular and no murmurs carotid pulses 3+ no audible bruits Lower extremity exam reveals 2+ femoral pulses bilaterally. No popliteal or distal pulses are palpable. Both feet are pink and well perfused. Good motion and sensation is noted.  Today I ordered lower extremity arterial brachial indices. The right side is increased from 0.61-0.81 left side from 0.6 -.70     Assessment:     Significant improvement following bilateral iliac PTA and stenting by Dr. Myra Gianotti March 2013    Plan:     Return in 6 months for ABIs and duplex scan of the aortoiliac system and see nurse practitioner

## 2011-11-17 DIAGNOSIS — I6529 Occlusion and stenosis of unspecified carotid artery: Secondary | ICD-10-CM

## 2011-12-05 ENCOUNTER — Other Ambulatory Visit: Payer: Self-pay | Admitting: Surgery

## 2012-04-17 ENCOUNTER — Other Ambulatory Visit: Payer: Self-pay | Admitting: *Deleted

## 2012-04-17 DIAGNOSIS — I739 Peripheral vascular disease, unspecified: Secondary | ICD-10-CM

## 2012-04-17 DIAGNOSIS — Z48812 Encounter for surgical aftercare following surgery on the circulatory system: Secondary | ICD-10-CM

## 2012-04-22 ENCOUNTER — Encounter: Payer: Self-pay | Admitting: Neurosurgery

## 2012-04-23 ENCOUNTER — Encounter (INDEPENDENT_AMBULATORY_CARE_PROVIDER_SITE_OTHER): Payer: Medicaid Other | Admitting: *Deleted

## 2012-04-23 ENCOUNTER — Ambulatory Visit: Payer: Medicaid Other | Admitting: Neurosurgery

## 2012-04-23 ENCOUNTER — Other Ambulatory Visit (INDEPENDENT_AMBULATORY_CARE_PROVIDER_SITE_OTHER): Payer: Medicaid Other | Admitting: *Deleted

## 2012-04-23 ENCOUNTER — Encounter: Payer: Self-pay | Admitting: Neurosurgery

## 2012-04-23 ENCOUNTER — Ambulatory Visit (INDEPENDENT_AMBULATORY_CARE_PROVIDER_SITE_OTHER): Payer: Medicaid Other | Admitting: Neurosurgery

## 2012-04-23 VITALS — BP 118/57 | HR 64 | Resp 16 | Ht 64.0 in | Wt 134.9 lb

## 2012-04-23 DIAGNOSIS — Z48812 Encounter for surgical aftercare following surgery on the circulatory system: Secondary | ICD-10-CM

## 2012-04-23 DIAGNOSIS — I739 Peripheral vascular disease, unspecified: Secondary | ICD-10-CM

## 2012-04-23 DIAGNOSIS — M79609 Pain in unspecified limb: Secondary | ICD-10-CM

## 2012-04-23 NOTE — Progress Notes (Signed)
[facility] PAD/PVD Office Note  CC: PVD surveillance Referring Physician: Hart Rochester  History of Present Illness: 51 year old male patient of Dr. Hart Rochester status post right external iliac stent, left common iliac and external iliac artery stents in July 2012 with bilateral iliac PTA use and stenting in March 2013 with Dr. Myra Gianotti. The patient denies claudication or rest pain. The patient states he does have pain in his ankles and left knee which is not vascular in nature. The patient denies any new medical diagnoses or recent surgery. The patient continues to smoke daily.  Past Medical History  Diagnosis Date  . Hypertension   . Hyperlipidemia   . Stroke   . Arthritis   . Anorexia 11/29/2010  . Generalized headaches   . Decreased vision   . Dyspnea on exertion   . Chest pain   . Depression   . Urinary frequency   . Peripheral vascular disease     ROS: [x]  Positive   [ ]  Denies    General: [ ]  Weight loss, [ ]  Fever, [ ]  chills Neurologic: [ ]  Dizziness, [ ]  Blackouts, [ ]  Seizure [ ]  Stroke, [ ]  "Mini stroke", [ ]  Slurred speech, [ ]  Temporary blindness; [ ]  weakness in arms or legs, [ ]  Hoarseness Cardiac: [ ]  Chest pain/pressure, [ ]  Shortness of breath at rest [ ]  Shortness of breath with exertion, [ ]  Atrial fibrillation or irregular heartbeat Vascular: [ ]  Pain in legs with walking, [ ]  Pain in legs at rest, [ ]  Pain in legs at night,  [ ]  Non-healing ulcer, [ ]  Blood clot in vein/DVT,   Pulmonary: [ ]  Home oxygen, [ ]  Productive cough, [ ]  Coughing up blood, [ ]  Asthma,  [ ]  Wheezing Musculoskeletal:  [ ]  Arthritis, [ ]  Low back pain, [ ]  Joint pain Hematologic: [ ]  Easy Bruising, [ ]  Anemia; [ ]  Hepatitis Gastrointestinal: [ ]  Blood in stool, [ ]  Gastroesophageal Reflux/heartburn, [ ]  Trouble swallowing Urinary: [ ]  chronic Kidney disease, [ ]  on HD - [ ]  MWF or [ ]  TTHS, [ ]  Burning with urination, [ ]  Difficulty urinating Skin: [ ]  Rashes, [ ]   Wounds Psychological: [ ]  Anxiety, [ ]  Depression   Social History History  Substance Use Topics  . Smoking status: Current Every Day Smoker -- 1.0 packs/day for 40 years    Types: Cigarettes  . Smokeless tobacco: Never Used  . Alcohol Use: Yes     Comment: OCCASIONALLY    Family History Family History  Problem Relation Age of Onset  . Heart disease Mother   . Diabetes Father     No Known Allergies  Current Outpatient Prescriptions  Medication Sig Dispense Refill  . aspirin 325 MG tablet Take 325 mg by mouth daily.        Marland Kitchen atorvastatin (LIPITOR) 80 MG tablet Take 80 mg by mouth daily.      . clopidogrel (PLAVIX) 75 MG tablet TAKE ONE TABLET BY MOUTH ONE TIME DAILY  30 tablet  5  . HYDROcodone-acetaminophen (VICODIN) 5-500 MG per tablet Take 1 tablet by mouth every 8 (eight) hours as needed. For pain      . celecoxib (CELEBREX) 200 MG capsule Take 200 mg by mouth 2 (two) times daily.        . Pitavastatin Calcium (LIVALO) 4 MG TABS Take 4 mg by mouth daily.      . [DISCONTINUED] pravastatin (PRAVACHOL) 40 MG tablet Take 40 mg  by mouth daily.          Physical Examination  Filed Vitals:   04/23/12 1031  BP: 118/57  Pulse: 64  Resp: 16    Body mass index is 23.16 kg/(m^2).  General:  WDWN in NAD Gait: Normal HEENT: WNL Eyes: Pupils equal Pulmonary: normal non-labored breathing , without Rales, rhonchi,  wheezing Cardiac: RRR, without  Murmurs, rubs or gallops; No carotid bruits Abdomen: soft, NT, no masses Skin: no rashes, ulcers noted Vascular Exam/Pulses: 2+ femoral pulses bilaterally, 3+ radial pulses  Extremities without ischemic changes, no Gangrene , no cellulitis; no open wounds;  Musculoskeletal: no muscle wasting or atrophy  Neurologic: A&O X 3; Appropriate Affect ; SENSATION: normal; MOTOR FUNCTION:  moving all extremities equally. Speech is fluent/normal  Non-Invasive Vascular Imaging: ABIs today are 0.83 and biphasic on the right, 0.83 and  biphasic on the left which is improved from previous exam in May 2013 there are some slight elevation in velocities throughout the common femoral and iliac arteries, this was reviewed with Dr. Hart Rochester who is not concerned about the velocities at this time but we will continue to monitor this on six-month basis.  ASSESSMENT/PLAN: The patient with improved claudication symptoms since his PTA and stenting. The patient will followup in 6 months with repeat aortoiliac duplex and ABIs. The patient's questions were encouraged and answered, he is in agreement with this plan.  Lauree Chandler ANP  Clinic M.D.: Hart Rochester

## 2012-04-24 NOTE — Addendum Note (Signed)
Addended by: Sharee Pimple on: 04/24/2012 01:38 PM   Modules accepted: Orders

## 2012-05-31 ENCOUNTER — Other Ambulatory Visit: Payer: Self-pay | Admitting: *Deleted

## 2012-05-31 DIAGNOSIS — I739 Peripheral vascular disease, unspecified: Secondary | ICD-10-CM

## 2012-05-31 MED ORDER — CLOPIDOGREL BISULFATE 75 MG PO TABS: 75.0000 mg | ORAL_TABLET | Freq: Every day | ORAL | Status: DC

## 2012-10-29 ENCOUNTER — Encounter (INDEPENDENT_AMBULATORY_CARE_PROVIDER_SITE_OTHER): Payer: Medicare Other | Admitting: *Deleted

## 2012-10-29 ENCOUNTER — Other Ambulatory Visit: Payer: Self-pay | Admitting: *Deleted

## 2012-10-29 ENCOUNTER — Other Ambulatory Visit (INDEPENDENT_AMBULATORY_CARE_PROVIDER_SITE_OTHER): Payer: Medicare Other | Admitting: *Deleted

## 2012-10-29 ENCOUNTER — Ambulatory Visit: Payer: Medicaid Other | Admitting: Neurosurgery

## 2012-10-29 ENCOUNTER — Encounter: Payer: Self-pay | Admitting: Vascular Surgery

## 2012-10-29 DIAGNOSIS — I739 Peripheral vascular disease, unspecified: Secondary | ICD-10-CM

## 2012-10-29 DIAGNOSIS — M79609 Pain in unspecified limb: Secondary | ICD-10-CM

## 2012-10-29 DIAGNOSIS — Z48812 Encounter for surgical aftercare following surgery on the circulatory system: Secondary | ICD-10-CM

## 2012-10-31 ENCOUNTER — Encounter: Payer: Self-pay | Admitting: Vascular Surgery

## 2012-12-02 ENCOUNTER — Other Ambulatory Visit: Payer: Self-pay | Admitting: *Deleted

## 2012-12-02 DIAGNOSIS — I739 Peripheral vascular disease, unspecified: Secondary | ICD-10-CM

## 2012-12-02 MED ORDER — CLOPIDOGREL BISULFATE 75 MG PO TABS: 75.0000 mg | ORAL_TABLET | Freq: Every day | ORAL | Status: DC

## 2013-07-02 ENCOUNTER — Other Ambulatory Visit: Payer: Self-pay | Admitting: *Deleted

## 2013-07-02 DIAGNOSIS — I739 Peripheral vascular disease, unspecified: Secondary | ICD-10-CM

## 2013-07-02 MED ORDER — CLOPIDOGREL BISULFATE 75 MG PO TABS: 75.0000 mg | ORAL_TABLET | Freq: Every day | ORAL | Status: DC

## 2013-09-30 DIAGNOSIS — I10 Essential (primary) hypertension: Secondary | ICD-10-CM | POA: Insufficient documentation

## 2013-10-28 ENCOUNTER — Encounter: Payer: Self-pay | Admitting: Family

## 2013-10-29 ENCOUNTER — Encounter: Payer: Self-pay | Admitting: Family

## 2013-10-30 ENCOUNTER — Encounter: Payer: Self-pay | Admitting: Family

## 2013-10-30 ENCOUNTER — Ambulatory Visit (INDEPENDENT_AMBULATORY_CARE_PROVIDER_SITE_OTHER): Payer: Medicare Other | Admitting: Family

## 2013-10-30 ENCOUNTER — Ambulatory Visit (HOSPITAL_COMMUNITY)
Admission: RE | Admit: 2013-10-30 | Discharge: 2013-10-30 | Disposition: A | Discharge status: Home / Self Care | Payer: Medicare Other | Source: Ambulatory Visit | Attending: Family | Admitting: Family

## 2013-10-30 ENCOUNTER — Ambulatory Visit (INDEPENDENT_AMBULATORY_CARE_PROVIDER_SITE_OTHER)
Admission: RE | Admit: 2013-10-30 | Discharge: 2013-10-30 | Disposition: A | Discharge status: Home / Self Care | Payer: Medicare Other | Source: Ambulatory Visit | Attending: Family | Admitting: Family

## 2013-10-30 VITALS — BP 151/93 | HR 68 | Resp 16 | Ht 65.0 in | Wt 144.0 lb

## 2013-10-30 DIAGNOSIS — I739 Peripheral vascular disease, unspecified: Secondary | ICD-10-CM

## 2013-10-30 DIAGNOSIS — I70219 Atherosclerosis of native arteries of extremities with intermittent claudication, unspecified extremity: Secondary | ICD-10-CM

## 2013-10-30 DIAGNOSIS — Z48812 Encounter for surgical aftercare following surgery on the circulatory system: Secondary | ICD-10-CM | POA: Diagnosis not present

## 2013-10-30 DIAGNOSIS — R109 Unspecified abdominal pain: Secondary | ICD-10-CM

## 2013-10-30 DIAGNOSIS — I63239 Cerebral infarction due to unspecified occlusion or stenosis of unspecified carotid arteries: Secondary | ICD-10-CM

## 2013-10-30 NOTE — Progress Notes (Signed)
[facility] HISTORY AND PHYSICAL -PAD  History of Present Illness Cameron Ware is a 53 y.o. male patient of Dr. Hart Rochester who is status post right external iliac stent, left common iliac and external iliac artery stents in July 2012 with bilateral iliac PTA use and stenting in March 2013 with Dr. Myra Gianotti. He returns today for follow up. For the last 2-4 months he has had a constant pulling sensation from pubis to right groin, worse when he moves, not changing in nature. Both buttocks and thigh muscles hurt when he walks 700-800 feet, relieved with a few minutes rest, left leg worse than right, denies non healing wounds. He had a stroke in 2012 as manifested by expressive aphasia and left hemiparesis, still has some left sided weakness and mild expressive aphasia, denies monocular blindness.  The patient denies New Medical or Surgical History.  Pt Diabetic: No Pt smoker: smoker  (1 cigarette/day lately, was smoking 1 ppd, started smoking at age 37 yrs)  Pt meds include: Statin :Yes ASA: Yes Other anticoagulants/antiplatelets: Plavix  Past Medical History  Diagnosis Date  . Hypertension   . Hyperlipidemia   . Stroke   . Arthritis   . Anorexia 11/29/2010  . Generalized headaches   . Decreased vision   . Dyspnea on exertion   . Chest pain   . Depression   . Urinary frequency   . Peripheral vascular disease     Social History History  Substance Use Topics  . Smoking status: Light Tobacco Smoker -- 1.00 packs/day for 40 years    Types: Cigarettes  . Smokeless tobacco: Never Used  . Alcohol Use: Yes     Comment: OCCASIONALLY    Family History Family History  Problem Relation Age of Onset  . Heart disease Mother   . Diabetes Father     Past Surgical History  Procedure Laterality Date  . Aortogram    . Stents  August 22, 2011    Bilateral iliac PTA and stenting    No Known Allergies  Current Outpatient Prescriptions  Medication  Sig Dispense Refill  . aspirin 325 MG tablet Take 325 mg by mouth daily.        Marland Kitchen atorvastatin (LIPITOR) 80 MG tablet Take 80 mg by mouth daily.      . celecoxib (CELEBREX) 200 MG capsule Take 200 mg by mouth 2 (two) times daily.        . clopidogrel (PLAVIX) 75 MG tablet Take 1 tablet (75 mg total) by mouth daily.  30 tablet  5  . HYDROcodone-acetaminophen (VICODIN) 5-500 MG per tablet Take 1 tablet by mouth every 8 (eight) hours as needed. For pain      . Pitavastatin Calcium (LIVALO) 4 MG TABS Take 4 mg by mouth daily.      . [DISCONTINUED] pravastatin (PRAVACHOL) 40 MG tablet Take 40 mg by mouth daily.         No current facility-administered medications for this visit.    ROS: See HPI for pertinent positives and negatives.   Physical Examination   Filed Vitals:   10/30/13 1008  BP: 151/93  Pulse: 68  Resp: 16  Height: 5\' 5"  (1.651 m)  Weight: 144 lb (65.318 kg)  SpO2: 98%   Body mass index is 23.96 kg/(m^2).  General: A&O x 3, WDWN. Gait: normal Eyes: PERRLA. Pulmonary: CTAB, decreased air movement in all lung fields,  without wheezes , rales or rhonchi. Cardiac: regular Rythm , without detected  murmur.         Carotid Bruits Left Right   Negative Negative  Aorta is not palpable. Radial pulses: 2+ and =                           VASCULAR EXAM: Extremities without ischemic changes  without Gangrene; without open wounds.                                                                                                          LE Pulses LEFT RIGHT       FEMORAL  2+ palpable  not palpable        POPLITEAL  not palpable   not palpable       POSTERIOR TIBIAL  not palpable   not palpable        DORSALIS PEDIS      ANTERIOR TIBIAL 2+ palpable  not palpable    Abdomen: soft, tender to palpation in LLQ and RLQ, no masses. Skin: no rashes, no ulcers noted. Musculoskeletal: no muscle wasting or atrophy.  Neurologic: A&O X 3; Appropriate Affect ; SENSATION: normal;  MOTOR FUNCTION:  moving all extremities equally, motor strength 4/5 throughout. Speech is fluent/normal. CN 2-12 intact.    Non-Invasive Vascular Imaging: DATE: 10/30/2013 [facility] - ILIAC DUPLEX EVALUATION    INDICATION: Iliac stents    PREVIOUS INTERVENTION(S): Bilateral external iliac & left common iliac artery stents on 12/13/10 Bilateral common iliac artery stents on 08/22/11    DUPLEX EXAM:      Peak Systolic Velocity (cm/s)  AORTA - Proximal 33  AORTA - Mid 42  AORTA - Distal 54    RIGHT  LEFT  Peak Systolic Velocity (cm/s) Ratio (if abnormal) Waveform  Peak Systolic Velocity (cm/s) Ratio (if abnormal) Waveform  88  M Common Iliac Artery - Proximal 86  M  97  M Common Iliac Artery - Mid 90  M  Not Visualized    Common Iliac Artery - Distal Not Visualized     153  B External Iliac Artery - Proximal 143  B  172  B External Iliac Artery - Mid 176  B  211  B External Iliac Artery - Distal 203  B  Not Visualized    Internal Iliac Artery Not Visualized     0.84 Today's ABI / TBI 0.77  0.78 Previous ABI / TBI (10/29/12 ) 0.67    Waveform:    M - Monophasic       B - Biphasic       T - Triphasic  If Ankle Brachial Index (ABI) or Toe Brachial Index (TBI) performed, please see complete report     ADDITIONAL FINDINGS: Decreased visualization of the abdominal vasculature and stents due to overlying bowel gas.     IMPRESSION: 1. Patent bilateral iliac stents with mildly elevated velocities suggestive of possible greater than 50% stenoses of the bilateral distal external iliac arteries, based on limited visualization. Decrease of the bilateral external iliac artery velocities noted when compared to  the previous exam on 10/29/12.      ASSESSMENT: Cameron Ware is a [age] y.o. male who is status post right external iliac stent, left common iliac and external iliac artery stents in July 2012 with bilateral iliac PTA use and stenting in March 2013. Patent bilateral iliac stents with  mildly elevated velocities suggestive of possible greater than 50% stenoses of the bilateral distal external iliac arteries, based on limited visualization. Decrease of the bilateral external iliac artery velocities noted when compared to the previous exam on 10/29/12. ABI's are slightly improved today compared to a year ago. Unfortunately he continues to smoke, has tried to quit. Discussed with Dr. Darrick PennaFields the pulling sensation that pt has in his lower abdomen which is not due to the iliac stents.  PLAN:  Smoking cessation counseling provided.  I discussed in depth with the patient the nature of atherosclerosis, and emphasized the importance of maximal medical management including strict control of blood pressure, blood glucose, and lipid levels, obtaining regular exercise, and cessation of smoking.  The patient is aware that without maximal medical management the underlying atherosclerotic disease process will progress, limiting the benefit of any interventions.  Based on the patient's vascular studies and examination, and after discussing with Dr. Darrick PennaFields, pt will return to clinic in 6 months for ABI's and bilateral LE arterial Duplex.  The patient was given information about PAD including signs, symptoms, treatment, what symptoms should prompt the patient to seek immediate medical care, and risk reduction measures to take.  Charisse MarchSuzanne Clemmie Marxen, RN, MSN, FNP-C [facility] [place] Office Phone: [phone]  Clinic MD: Darrick PennaFields  10/30/2013 10:50 AM

## 2013-10-30 NOTE — Patient Instructions (Addendum)
Peripheral Vascular Disease Peripheral Vascular Disease (PVD), also called Peripheral Arterial Disease (PAD), is a circulation problem caused by cholesterol (atherosclerotic plaque) deposits in the arteries. PVD commonly occurs in the lower extremities (legs) but it can occur in other areas of the body, such as your arms. The cholesterol buildup in the arteries reduces blood flow which can cause pain and other serious problems. The presence of PVD can place a person at risk for Coronary Artery Disease (CAD).  CAUSES  Causes of PVD can be many. It is usually associated with more than one risk factor such as:   High Cholesterol.  Smoking.  Diabetes.  Lack of exercise or inactivity.  High blood pressure (hypertension).  Obesity.  Family history. SYMPTOMS   When the lower extremities are affected, patients with PVD may experience:  Leg pain with exertion or physical activity. This is called INTERMITTENT CLAUDICATION. This may present as cramping or numbness with physical activity. The location of the pain is associated with the level of blockage. For example, blockage at the abdominal level (distal abdominal aorta) may result in buttock or hip pain. Lower leg arterial blockage may result in calf pain.  As PVD becomes more severe, pain can develop with less physical activity.  In people with severe PVD, leg pain may occur at rest.  Other PVD signs and symptoms:  Leg numbness or weakness.  Coldness in the affected leg or foot, especially when compared to the other leg.  A change in leg color.  Patients with significant PVD are more prone to ulcers or sores on toes, feet or legs. These may take longer to heal or may reoccur. The ulcers or sores can become infected.  If signs and symptoms of PVD are ignored, gangrene may occur. This can result in the loss of toes or loss of an entire limb.  Not all leg pain is related to PVD. Other medical conditions can cause leg pain such  as:  Blood clots (embolism) or Deep Vein Thrombosis.  Inflammation of the blood vessels (vasculitis).  Spinal stenosis. DIAGNOSIS  Diagnosis of PVD can involve several different types of tests. These can include:  Pulse Volume Recording Method (PVR). This test is simple, painless and does not involve the use of X-rays. PVR involves measuring and comparing the blood pressure in the arms and legs. An ABI (Ankle-Brachial Index) is calculated. The normal ratio of blood pressures is 1. As this number becomes smaller, it indicates more severe disease.  < 0.95  indicates significant narrowing in one or more leg vessels.  <0.8 there will usually be pain in the foot, leg or buttock with exercise.  <0.4 will usually have pain in the legs at rest.  <0.25  usually indicates limb threatening PVD.  Doppler detection of pulses in the legs. This test is painless and checks to see if you have a pulses in your legs/feet.  A dye or contrast material (a substance that highlights the blood vessels so they show up on x-ray) may be given to help your caregiver better see the arteries for the following tests. The dye is eliminated from your body by the kidney's. Your caregiver may order blood work to check your kidney function and other laboratory values before the following tests are performed:  Magnetic Resonance Angiography (MRA). An MRA is a picture study of the blood vessels and arteries. The MRA machine uses a large magnet to produce images of the blood vessels.  Computed Tomography Angiography (CTA). A CTA is a   specialized x-ray that looks at how the blood flows in your blood vessels. An IV may be inserted into your arm so contrast dye can be injected.  Angiogram. Is a procedure that uses x-rays to look at your blood vessels. This procedure is minimally invasive, meaning a small incision (cut) is made in your groin. A small tube (catheter) is then inserted into the artery of your groin. The catheter is  guided to the blood vessel or artery your caregiver wants to examine. Contrast dye is injected into the catheter. X-rays are then taken of the blood vessel or artery. After the images are obtained, the catheter is taken out. TREATMENT  Treatment of PVD involves many interventions which may include:  Lifestyle changes:  Quitting smoking.  Exercise.  Following a low fat, low cholesterol diet.  Control of diabetes.  Foot care is very important to the PVD patient. Good foot care can help prevent infection.  Medication:  Cholesterol-lowering medicine.  Blood pressure medicine.  Anti-platelet drugs.  Certain medicines may reduce symptoms of Intermittent Claudication.  Interventional/Surgical options:  Angioplasty. An Angioplasty is a procedure that inflates a balloon in the blocked artery. This opens the blocked artery to improve blood flow.  Stent Implant. A wire mesh tube (stent) is placed in the artery. The stent expands and stays in place, allowing the artery to remain open.  Peripheral Bypass Surgery. This is a surgical procedure that reroutes the blood around a blocked artery to help improve blood flow. This type of procedure may be performed if Angioplasty or stent implants are not an option. SEEK IMMEDIATE MEDICAL CARE IF:   You develop pain or numbness in your arms or legs.  Your arm or leg turns cold, becomes blue in color.  You develop redness, warmth, swelling and pain in your arms or legs. MAKE SURE YOU:   Understand these instructions.  Will watch your condition.  Will get help right away if you are not doing well or get worse. Document Released: 06/22/2004 Document Revised: 08/07/2011 Document Reviewed: 05/19/2008 Exit[employer] Patient Information 2014 [employer].   Smoking Cessation Quitting smoking is important to your health and has many advantages. However, it is not always easy to quit since nicotine is a very addictive drug. Often times, people try 3  times or more before being able to quit. This document explains the best ways for you to prepare to quit smoking. Quitting takes hard work and a lot of effort, but you can do it. ADVANTAGES OF QUITTING SMOKING  You will live longer, feel better, and live better.  Your body will feel the impact of quitting smoking almost immediately.  Within 20 minutes, blood pressure decreases. Your pulse returns to its normal level.  After 8 hours, carbon monoxide levels in the blood return to normal. Your oxygen level increases.  After 24 hours, the chance of having a heart attack starts to decrease. Your breath, hair, and body stop smelling like smoke.  After 48 hours, damaged nerve endings begin to recover. Your sense of taste and smell improve.  After 72 hours, the body is virtually free of nicotine. Your bronchial tubes relax and breathing becomes easier.  After 2 to 12 weeks, lungs can hold more air. Exercise becomes easier and circulation improves.  The risk of having a heart attack, stroke, cancer, or lung disease is greatly reduced.  After 1 year, the risk of coronary heart disease is cut in half.  After 5 years, the risk of stroke falls to   the same as a nonsmoker.  After 10 years, the risk of lung cancer is cut in half and the risk of other cancers decreases significantly.  After 15 years, the risk of coronary heart disease drops, usually to the level of a nonsmoker.  If you are pregnant, quitting smoking will improve your chances of having a healthy baby.  The people you live with, especially any children, will be healthier.  You will have extra money to spend on things other than cigarettes. QUESTIONS TO THINK ABOUT BEFORE ATTEMPTING TO QUIT You may want to talk about your answers with your caregiver.  Why do you want to quit?  If you tried to quit in the past, what helped and what did not?  What will be the most difficult situations for you after you quit? How will you plan to  handle them?  Who can help you through the tough times? Your family? Friends? A caregiver?  What pleasures do you get from smoking? What ways can you still get pleasure if you quit? Here are some questions to ask your caregiver:  How can you help me to be successful at quitting?  What medicine do you think would be best for me and how should I take it?  What should I do if I need more help?  What is smoking withdrawal like? How can I get information on withdrawal? GET READY  Set a quit date.  Change your environment by getting rid of all cigarettes, ashtrays, matches, and lighters in your home, car, or work. Do not let people smoke in your home.  Review your past attempts to quit. Think about what worked and what did not. GET SUPPORT AND ENCOURAGEMENT You have a better chance of being successful if you have help. You can get support in many ways.  Tell your family, friends, and co-workers that you are going to quit and need their support. Ask them not to smoke around you.  Get individual, group, or telephone counseling and support. Programs are available at local hospitals and health centers. Call your local health department for information about programs in your area.  Spiritual beliefs and practices may help some smokers quit.  Download a "quit meter" on your computer to keep track of quit statistics, such as how long you have gone without smoking, cigarettes not smoked, and money saved.  Get a self-help book about quitting smoking and staying off of tobacco. LEARN NEW SKILLS AND BEHAVIORS  Distract yourself from urges to smoke. Talk to someone, go for a walk, or occupy your time with a task.  Change your normal routine. Take a different route to work. Drink tea instead of coffee. Eat breakfast in a different place.  Reduce your stress. Take a hot bath, exercise, or read a book.  Plan something enjoyable to do every day. Reward yourself for not smoking.  Explore  interactive web-based programs that specialize in helping you quit. GET MEDICINE AND USE IT CORRECTLY Medicines can help you stop smoking and decrease the urge to smoke. Combining medicine with the above behavioral methods and support can greatly increase your chances of successfully quitting smoking.  Nicotine replacement therapy helps deliver nicotine to your body without the negative effects and risks of smoking. Nicotine replacement therapy includes nicotine gum, lozenges, inhalers, nasal sprays, and skin patches. Some may be available over-the-counter and others require a prescription.  Antidepressant medicine helps people abstain from smoking, but how this works is unknown. This medicine is available by prescription.    Nicotinic receptor partial agonist medicine simulates the effect of nicotine in your brain. This medicine is available by prescription. Ask your caregiver for advice about which medicines to use and how to use them based on your health history. Your caregiver will tell you what side effects to look out for if you choose to be on a medicine or therapy. Carefully read the information on the package. Do not use any other product containing nicotine while using a nicotine replacement product.  RELAPSE OR DIFFICULT SITUATIONS Most relapses occur within the first 3 months after quitting. Do not be discouraged if you start smoking again. Remember, most people try several times before finally quitting. You may have symptoms of withdrawal because your body is used to nicotine. You may crave cigarettes, be irritable, feel very hungry, cough often, get headaches, or have difficulty concentrating. The withdrawal symptoms are only temporary. They are strongest when you first quit, but they will go away within 10 14 days. To reduce the chances of relapse, try to:  Avoid drinking alcohol. Drinking lowers your chances of successfully quitting.  Reduce the amount of caffeine you consume. Once you  quit smoking, the amount of caffeine in your body increases and can give you symptoms, such as a rapid heartbeat, sweating, and anxiety.  Avoid smokers because they can make you want to smoke.  Do not let weight gain distract you. Many smokers will gain weight when they quit, usually less than 10 pounds. Eat a healthy diet and stay active. You can always lose the weight gained after you quit.  Find ways to improve your mood other than smoking. FOR MORE INFORMATION  www.smokefree.gov  Document Released: 05/09/2001 Document Revised: 11/14/2011 Document Reviewed: 08/24/2011 [employer] Patient Information 2014 [employer].  

## 2014-04-29 ENCOUNTER — Other Ambulatory Visit: Payer: Self-pay | Admitting: *Deleted

## 2014-04-29 DIAGNOSIS — Z48812 Encounter for surgical aftercare following surgery on the circulatory system: Secondary | ICD-10-CM

## 2014-04-29 DIAGNOSIS — I739 Peripheral vascular disease, unspecified: Secondary | ICD-10-CM

## 2014-05-04 ENCOUNTER — Encounter: Payer: Self-pay | Admitting: Family

## 2014-05-05 ENCOUNTER — Ambulatory Visit (INDEPENDENT_AMBULATORY_CARE_PROVIDER_SITE_OTHER): Payer: Medicare Other | Admitting: Family

## 2014-05-05 ENCOUNTER — Ambulatory Visit (HOSPITAL_COMMUNITY)
Admission: RE | Admit: 2014-05-05 | Discharge: 2014-05-05 | Disposition: A | Discharge status: Home / Self Care | Payer: Medicare Other | Source: Ambulatory Visit | Attending: Family | Admitting: Family

## 2014-05-05 ENCOUNTER — Ambulatory Visit (INDEPENDENT_AMBULATORY_CARE_PROVIDER_SITE_OTHER)
Admission: RE | Admit: 2014-05-05 | Discharge: 2014-05-05 | Disposition: A | Discharge status: Home / Self Care | Payer: Medicare Other | Source: Ambulatory Visit | Attending: Family | Admitting: Family

## 2014-05-05 ENCOUNTER — Encounter: Payer: Self-pay | Admitting: Family

## 2014-05-05 VITALS — BP 142/80 | HR 71 | Resp 16 | Ht 65.0 in | Wt 138.0 lb

## 2014-05-05 DIAGNOSIS — Z48812 Encounter for surgical aftercare following surgery on the circulatory system: Secondary | ICD-10-CM | POA: Insufficient documentation

## 2014-05-05 DIAGNOSIS — Z9862 Peripheral vascular angioplasty status: Secondary | ICD-10-CM

## 2014-05-05 DIAGNOSIS — I70213 Atherosclerosis of native arteries of extremities with intermittent claudication, bilateral legs: Secondary | ICD-10-CM | POA: Insufficient documentation

## 2014-05-05 DIAGNOSIS — Z9889 Other specified postprocedural states: Secondary | ICD-10-CM

## 2014-05-05 DIAGNOSIS — I739 Peripheral vascular disease, unspecified: Secondary | ICD-10-CM

## 2014-05-05 DIAGNOSIS — I70219 Atherosclerosis of native arteries of extremities with intermittent claudication, unspecified extremity: Secondary | ICD-10-CM

## 2014-05-05 DIAGNOSIS — Z8679 Personal history of other diseases of the circulatory system: Secondary | ICD-10-CM

## 2014-05-05 DIAGNOSIS — I639 Cerebral infarction, unspecified: Secondary | ICD-10-CM

## 2014-05-05 DIAGNOSIS — M25552 Pain in left hip: Secondary | ICD-10-CM

## 2014-05-05 DIAGNOSIS — I6523 Occlusion and stenosis of bilateral carotid arteries: Secondary | ICD-10-CM | POA: Insufficient documentation

## 2014-05-05 DIAGNOSIS — Z95828 Presence of other vascular implants and grafts: Secondary | ICD-10-CM

## 2014-05-05 DIAGNOSIS — M25551 Pain in right hip: Secondary | ICD-10-CM

## 2014-05-05 NOTE — Patient Instructions (Signed)

## 2014-05-05 NOTE — Progress Notes (Signed)
[facility] HISTORY [facility] PHYSICAL   MRN : [id number]  History of Present Illness:   Cameron Ware is a 53 y.o. male patient of Dr. Hart RochesterLawson who is status post right external iliac stent, left common iliac and external iliac artery stents in July 2012 with bilateral iliac PTA use and stenting in March 2013 with Dr. Myra GianottiBrabham. He returns today for follow up. Since about February 2015 he has had a constant pulling sensation from pubis to right groin, worse when he moves, not changing in nature, he thinks he strained something. Both hips and thighs hurt when he walks 700-800 feet, relieved with a few minutes rest, left leg worse than right, denies non healing , this has not changed. He had a stroke in 2012 as manifested by expressive aphasia and left hemiparesis, still has some left sided weakness and mild expressive aphasia, denies monocular blindness, no further stroke or TIA symptoms.  Since about August 2015 he has been having diarrhea and gas, he denies antibiotic use.  The patient denies New Medical or Surgical History.  Pt Diabetic: No Pt smoker: former smoker (stopped end of November, 2015, was smoking 1 ppd, started smoking at age 53 yrs)  Pt meds include: Statin :Yes ASA: Yes Other anticoagulants/antiplatelets: Plavix   Current Outpatient Prescriptions  Medication Sig Dispense Refill  . aspirin 325 MG tablet Take 325 mg by mouth daily.      Marland Kitchen. atorvastatin (LIPITOR) 80 MG tablet Take 80 mg by mouth daily.    . clopidogrel (PLAVIX) 75 MG tablet Take 1 tablet (75 mg total) by mouth daily. 30 tablet 5  . HYDROcodone-acetaminophen (VICODIN) 5-500 MG per tablet Take 1 tablet by mouth every 8 (eight) hours as needed. For pain    . Pitavastatin Calcium (LIVALO) 4 MG TABS Take 4 mg by mouth daily.    . celecoxib (CELEBREX) 200 MG capsule Take 200 mg by mouth 2 (two) times daily.      Marland Kitchen. omeprazole (PRILOSEC) 40 MG capsule Take 40 mg by mouth.    .  potassium chloride (K-DUR) 10 MEQ tablet Take 10 mEq by mouth.    . [DISCONTINUED] pravastatin (PRAVACHOL) 40 MG tablet Take 40 mg by mouth daily.       No current facility-administered medications for this visit.    Past Medical History  Diagnosis Date  . Hypertension   . Hyperlipidemia   . Stroke   . Arthritis   . Anorexia 11/29/2010  . Generalized headaches   . Decreased vision   . Dyspnea on exertion   . Chest pain   . Depression   . Urinary frequency   . Peripheral vascular disease     Social History History  Substance Use Topics  . Smoking status: Light Tobacco Smoker -- 1.00 packs/day for 40 years    Types: Cigarettes  . Smokeless tobacco: Never Used  . Alcohol Use: Yes     Comment: OCCASIONALLY    Family History Family History  Problem Relation Age of Onset  . Heart disease Mother   . Diabetes Father     Surgical History Past Surgical History  Procedure Laterality Date  . Aortogram    . Stents  August 22, 2011    Bilateral iliac PTA and stenting    No Known Allergies  Current Outpatient Prescriptions  Medication Sig Dispense Refill  . aspirin 325 MG tablet Take 325 mg by mouth daily.      Marland Kitchen. atorvastatin (LIPITOR) 80  MG tablet Take 80 mg by mouth daily.    . clopidogrel (PLAVIX) 75 MG tablet Take 1 tablet (75 mg total) by mouth daily. 30 tablet 5  . HYDROcodone-acetaminophen (VICODIN) 5-500 MG per tablet Take 1 tablet by mouth every 8 (eight) hours as needed. For pain    . Pitavastatin Calcium (LIVALO) 4 MG TABS Take 4 mg by mouth daily.    . celecoxib (CELEBREX) 200 MG capsule Take 200 mg by mouth 2 (two) times daily.      Marland Kitchen omeprazole (PRILOSEC) 40 MG capsule Take 40 mg by mouth.    . potassium chloride (K-DUR) 10 MEQ tablet Take 10 mEq by mouth.    . [DISCONTINUED] pravastatin (PRAVACHOL) 40 MG tablet Take 40 mg by mouth daily.       No current facility-administered medications for this visit.     REVIEW OF SYSTEMS: See HPI for pertinent  positives and negatives.  Physical Examination Filed Vitals:   05/05/14 1430 05/05/14 1432  BP: 148/88 142/80  Pulse: 73 71  Resp:  16  Height:  5\' 5"  (1.651 m)  Weight:  138 lb (62.596 kg)  SpO2:  100%   Body mass index is 22.96 kg/(m^2).  General: A&O x 3, WDWN. Gait: normal Eyes: PERRLA. Pulmonary: CTAB,  without wheezes , rales or rhonchi. Cardiac: regular rythm , without detected murmur.     Carotid Bruits Left Right   Negative Negative  Aorta is not palpable. Radial pulses: 2+ and =   VASCULAR EXAM: Extremities without ischemic changes  without Gangrene; without open wounds.     LE Pulses LEFT RIGHT   FEMORAL 2+ palpable not palpable    POPLITEAL not palpable  not palpable   POSTERIOR TIBIAL not palpable  not palpable    DORSALIS PEDIS  ANTERIOR TIBIAL 2+ palpable  2+ palpable    Abdomen: soft, non tender, no palpable masses. Skin: no rashes, no ulcers noted. Musculoskeletal: no muscle wasting or atrophy. Neurologic: A&O X 3; Appropriate Affect ; SENSATION: normal; MOTOR FUNCTION: moving all extremities equally, motor strength 5/5 throughout. Speech is fluent/normal. CN 2-12 intact. Pain in sacrum with resistance to straight leg raising in both legs (positive straight leg raise test).    Non-Invasive Vascular Imaging (05/05/2014):  CEREBROVASCULAR DUPLEX EVALUATION    INDICATION: Carotid artery disease    PREVIOUS INTERVENTION(S): None    DUPLEX EXAM: Carotid duplex    RIGHT  LEFT  Peak Systolic Velocities (cm/s) End Diastolic Velocities (cm/s) Plaque LOCATION Peak Systolic Velocities (cm/s) End Diastolic Velocities (cm/s) Plaque  95 16 HT CCA PROXIMAL 116 22 HT  89 16 HT CCA MID 71 21 HT  71 15 HT CCA DISTAL 104 21 HT   97 19 - ECA 97 16 -  58 16 CP ICA PROXIMAL 67 21 HT  81 23 - ICA MID 101 29 -  87 24 - ICA DISTAL 88 28 -    .97 ICA / CCA Ratio (PSV) 1.1  Antegrade Vertebral Flow Antegrade  137 Brachial Systolic Pressure (mmHg) 134  Triphasic Brachial Artery Waveforms Triphasic    Plaque Morphology:  HM = Homogeneous, HT = Heterogeneous, CP = Calcific Plaque, SP = Smooth Plaque, IP = Irregular Plaque     ADDITIONAL FINDINGS:     IMPRESSION: 1. Less than 40% bilateral internal carotid artery stenosis.    Compared to the previous exam:  No change     ILIAC ARTERY STENT EVALUATION    INDICATION: Follow up stents  PREVIOUS INTERVENTION(S): Bilateral EIA and left CIA stents 12/13/2010; Bilateral CIA stents 08/22/2011    DUPLEX EXAM:     RIGHT  LEFT   Peak Systolic Velocity (cm/s) Ratio (if abnormal) Waveform  Peak Systolic Velocity (cm/s) Ratio (if abnormal) Waveform  94  B Aorta - Distal 94 EIA B  66  B Artery - Proximal to Stent 134 stent B  N/V  - Stent - Proximal N/V - -  N/V  - Stent - Mid 167 - M brisk  N/V  - Stent - Distal 164 - B  N/V  - Artery - Distal to Stent 91 - B  .98 Today's ABI / TBI .93  .84 Previous ABI / TBI (  10/30/2013) .77    Waveform:    M - Monophasic       B - Biphasic       T - Triphasic  If Ankle Brachial Index (ABI) or Toe Brachial Index (TBI) performed, please see complete report     ADDITIONAL FINDINGS: Patient has had diarrhea and gas for two months. Right CFA 173 cms broad biphasic waveform, Left CFA 173 cms biphasic waveform    IMPRESSION: 1. Excessive bowel gas, unable to thoroughly visualize the bilateral common iliac artery stents or right external iliac artery stent. 2. The left external iliac artery stent appears patent.    Compared to the previous exam:  Limited visualization      ASSESSMENT:  Cameron Ware is a 53 y.o. male who is status post right external iliac stent, left common iliac and external iliac artery stents in July  2012 with bilateral iliac PTA use and stenting in March 2013.  He had a stroke in 2012 as manifested by expressive aphasia and left hemiparesis, still has some left sided weakness and mild expressive aphasia, no further stroke or TIA symptoms. Today's carotid Duplex reveals less than 40% bilateral internal carotid artery stenosis; no change from 08/10/11 carotid Duplex.  His ABI's have improved to normal. Today's iliac artery stent Duplex reveals excessive bowel gas, unable to thoroughly visualize the bilateral common iliac artery stents or right external iliac artery stent. The left external iliac artery stent appears patent. The pain in his legs is not due to claudication since his ABI's are normal with all biphasic waveforms.  He has a positive bilateral SLR test eliciting pain in sacrum with test to both legs; pt states he will speak with his PCP re a referral for evaluation of his lumbar spine and sacrum.  Fortunately he stopped smoking 3 weeks ago.   PLAN:   Based on today's exam and non-invasive vascular lab results, the patient will follow up in 1 year with the following tests: bilateral iliac artery Duplex and ABI's. Will consider carotid Duplex repeat in 1-2 years. I discussed in depth with the patient the nature of atherosclerosis, and emphasized the importance of maximal medical management including strict control of blood pressure, blood glucose, and lipid levels, obtaining regular exercise, and cessation of smoking.  The patient is aware that without maximal medical management the underlying atherosclerotic disease process will progress, limiting the benefit of any interventions.  The patient was given information about stroke prevention and what symptoms should prompt the patient to seek immediate medical care.  The patient was given information about PAD including signs, symptoms, treatment, what symptoms should prompt the patient to seek immediate medical care, and risk  reduction measures to take. Thank you for allowing us to participate in this patient's  care.  Charisse March, RN, MSN, FNP-C [facility] Office: [phone]  Clinic MD: Early 05/05/2014 2:33 PM

## 2014-05-07 ENCOUNTER — Encounter (HOSPITAL_COMMUNITY): Payer: Self-pay | Admitting: Surgery

## 2015-05-05 ENCOUNTER — Encounter: Payer: Self-pay | Admitting: Family

## 2015-05-11 ENCOUNTER — Ambulatory Visit: Payer: Medicare Other | Admitting: Family

## 2015-05-11 ENCOUNTER — Encounter (HOSPITAL_COMMUNITY): Payer: Medicare Other

## 2015-12-28 DIAGNOSIS — Z0279 Encounter for issue of other medical certificate: Secondary | ICD-10-CM

## 2015-12-30 ENCOUNTER — Encounter: Payer: Self-pay | Admitting: Family

## 2016-01-05 ENCOUNTER — Other Ambulatory Visit: Payer: Self-pay | Admitting: *Deleted

## 2016-01-05 DIAGNOSIS — Z9862 Peripheral vascular angioplasty status: Secondary | ICD-10-CM

## 2016-01-05 DIAGNOSIS — I723 Aneurysm of iliac artery: Secondary | ICD-10-CM

## 2016-01-07 ENCOUNTER — Encounter: Payer: Self-pay | Admitting: Family

## 2016-01-07 ENCOUNTER — Ambulatory Visit (HOSPITAL_COMMUNITY)
Admission: RE | Admit: 2016-01-07 | Discharge: 2016-01-07 | Disposition: A | Discharge status: Home / Self Care | Payer: Medicare Other | Source: Ambulatory Visit | Attending: Family | Admitting: Family

## 2016-01-07 ENCOUNTER — Ambulatory Visit (INDEPENDENT_AMBULATORY_CARE_PROVIDER_SITE_OTHER): Payer: Medicare Other | Admitting: Family

## 2016-01-07 ENCOUNTER — Ambulatory Visit (INDEPENDENT_AMBULATORY_CARE_PROVIDER_SITE_OTHER)
Admission: RE | Admit: 2016-01-07 | Discharge: 2016-01-07 | Disposition: A | Discharge status: Home / Self Care | Payer: Medicare Other | Source: Ambulatory Visit | Attending: Family | Admitting: Family

## 2016-01-07 VITALS — BP 166/90 | HR 75 | Ht 65.0 in | Wt 143.0 lb

## 2016-01-07 DIAGNOSIS — Z95828 Presence of other vascular implants and grafts: Secondary | ICD-10-CM | POA: Diagnosis not present

## 2016-01-07 DIAGNOSIS — I723 Aneurysm of iliac artery: Secondary | ICD-10-CM | POA: Diagnosis not present

## 2016-01-07 DIAGNOSIS — I771 Stricture of artery: Secondary | ICD-10-CM

## 2016-01-07 DIAGNOSIS — Z87891 Personal history of nicotine dependence: Secondary | ICD-10-CM

## 2016-01-07 DIAGNOSIS — Z9862 Peripheral vascular angioplasty status: Secondary | ICD-10-CM | POA: Diagnosis not present

## 2016-01-07 DIAGNOSIS — I708 Atherosclerosis of other arteries: Secondary | ICD-10-CM | POA: Insufficient documentation

## 2016-01-07 NOTE — Patient Instructions (Signed)
Peripheral Vascular Disease Peripheral vascular disease (PVD) is a disease of the blood vessels that are not part of your heart and brain. A simple term for PVD is poor circulation. In most cases, PVD narrows the blood vessels that carry blood from your heart to the rest of your body. This can result in a decreased supply of blood to your arms, legs, and internal organs, like your stomach or kidneys. However, it most often affects a person's lower legs and feet. There are two types of PVD.  Organic PVD. This is the more common type. It is caused by damage to the structure of blood vessels.  Functional PVD. This is caused by conditions that make blood vessels contract and tighten (spasm). Without treatment, PVD tends to get worse over time. PVD can also lead to acute ischemic limb. This is when an arm or limb suddenly has trouble getting enough blood. This is a medical emergency. CAUSES Each type of PVD has many different causes. The most common cause of PVD is buildup of a fatty material (plaque) inside of your arteries (atherosclerosis). Small amounts of plaque can break off from the walls of the blood vessels and become lodged in a smaller artery. This blocks blood flow and can cause acute ischemic limb. Other common causes of PVD include:  Blood clots that form inside of blood vessels.  Injuries to blood vessels.  Diseases that cause inflammation of blood vessels or cause blood vessel spasms.  Health behaviors and health history that increase your risk of developing PVD. RISK FACTORS  You may have a greater risk of PVD if you:  Have a family history of PVD.  Have certain medical conditions, including:  High cholesterol.  Diabetes.  High blood pressure (hypertension).  Coronary heart disease.  Past problems with blood clots.  Past injury, such as burns or a broken bone. These may have damaged blood vessels in your limbs.  Buerger disease. This is caused by inflamed blood  vessels in your hands and feet.  Some forms of arthritis.  Rare birth defects that affect the arteries in your legs.  Use tobacco.  Do not get enough exercise.  Are obese.  Are age 50 or older. SIGNS AND SYMPTOMS  PVD may cause many different symptoms. Your symptoms depend on what part of your body is not getting enough blood. Some common signs and symptoms include:  Cramps in your lower legs. This may be a symptom of poor leg circulation (claudication).  Pain and weakness in your legs while you are physically active that goes away when you rest (intermittent claudication).  Leg pain when at rest.  Leg numbness, tingling, or weakness.  Coldness in a leg or foot, especially when compared with the other leg.  Skin or hair changes. These can include:  Hair loss.  Shiny skin.  Pale or bluish skin.  Thick toenails.  Inability to get or maintain an erection (erectile dysfunction). People with PVD are more prone to developing ulcers and sores on their toes, feet, or legs. These may take longer than normal to heal. DIAGNOSIS Your health care provider may diagnose PVD from your signs and symptoms. The health care provider will also do a physical exam. You may have tests to find out what is causing your PVD and determine its severity. Tests may include:  Blood pressure recordings from your arms and legs and measurements of the strength of your pulses (pulse volume recordings).  Imaging studies using sound waves to take pictures of   the blood flow through your blood vessels (Doppler ultrasound).  Injecting a dye into your blood vessels before having imaging studies using:  X-rays (angiogram or arteriogram).  Computer-generated X-rays (CT angiogram).  A powerful electromagnetic field and a computer (magnetic resonance angiogram or MRA). TREATMENT Treatment for PVD depends on the cause of your condition and the severity of your symptoms. It also depends on your age. Underlying  causes need to be treated and controlled. These include long-lasting (chronic) conditions, such as diabetes, high cholesterol, and high blood pressure. You may need to first try making lifestyle changes and taking medicines. Surgery may be needed if these do not work. Lifestyle changes may include:  Quitting smoking.  Exercising regularly.  Following a low-fat, low-cholesterol diet. Medicines may include:  Blood thinners to prevent blood clots.  Medicines to improve blood flow.  Medicines to improve your blood cholesterol levels. Surgical procedures may include:  A procedure that uses an inflated balloon to open a blocked artery and improve blood flow (angioplasty).  A procedure to put in a tube (stent) to keep a blocked artery open (stent implant).  Surgery to reroute blood flow around a blocked artery (peripheral bypass surgery).  Surgery to remove dead tissue from an infected wound on the affected limb.  Amputation. This is surgical removal of the affected limb. This may be necessary in cases of acute ischemic limb that are not improved through medical or surgical treatments. HOME CARE INSTRUCTIONS  Take medicines only as directed by your health care provider.  Do not use any tobacco products, including cigarettes, chewing tobacco, or electronic cigarettes. If you need help quitting, ask your health care provider.  Lose weight if you are overweight, and maintain a healthy weight as directed by your health care provider.  Eat a diet that is low in fat and cholesterol. If you need help, ask your health care provider.  Exercise regularly. Ask your health care provider to suggest some good activities for you.  Use compression stockings or other mechanical devices as directed by your health care provider.  Take good care of your feet.  Wear comfortable shoes that fit well.  Check your feet often for any cuts or sores. SEEK MEDICAL CARE IF:  You have cramps in your legs  while walking.  You have leg pain when you are at rest.  You have coldness in a leg or foot.  Your skin changes.  You have erectile dysfunction.  You have cuts or sores on your feet that are not healing. SEEK IMMEDIATE MEDICAL CARE IF:  Your arm or leg turns cold and blue.  Your arms or legs become red, warm, swollen, painful, or numb.  You have chest pain or trouble breathing.  You suddenly have weakness in your face, arm, or leg.  You become very confused or lose the ability to speak.  You suddenly have a very bad headache or lose your vision.   This information is not intended to replace advice given to you by your health care provider. Make sure you discuss any questions you have with your health care provider.   Document Released: 06/22/2004 Document Revised: 06/05/2014 Document Reviewed: 10/23/2013 Elsevier Interactive Patient Education 2016 Elsevier Inc.  

## 2016-01-07 NOTE — Progress Notes (Signed)
[facility] [place]   CC: Follow up peripheral artery occlusive disease  History of Present Illness Cameron [facility] is a 55 y.o. male patient of Dr. Hart Rochester who is status post right external iliac stent, left common iliac and external iliac artery stents in July 2012 with bilateral iliac PTA use and stenting in March 2013 with Dr. Myra Gianotti. He returns today for follow up.  Both groins and low back hurt when he walks 400 feet, relieved with a few minutes rest, left groin worse than right, denies non healing wounds. He denies claudication symptoms in his thighs or calves. He saw his PCP a couple of months ago re his bilateral groin pain, states he was checked for hernias and does not have any.  He had a stroke in 2012 as manifested by expressive aphasia and left hemiparesis, still has some left sided weakness and mild expressive aphasia, denies monocular blindness, no further stroke or TIA symptoms.  Pt Diabetic: No Pt smoker: former smoker (stopped end of November, 2015, was smoking 1 ppd, started smoking at age 43 yrs)  Pt meds include: Statin :Yes ASA: Yes Other anticoagulants/antiplatelets: Plavix     Past Medical History:  Diagnosis Date  . Anorexia 11/29/2010  . Arthritis   . Chest pain   . Decreased vision   . Depression   . Dyspnea on exertion   . Generalized headaches   . Hyperlipidemia   . Hypertension   . Peripheral vascular disease (HCC)   . Stroke (HCC)   . Urinary frequency     Social History Social History  Substance Use Topics  . Smoking status: Light Tobacco Smoker    Packs/day: 1.00    Years: 40.00    Types: Cigarettes  . Smokeless tobacco: Never Used  . Alcohol use Yes     Comment: OCCASIONALLY    Family History Family History  Problem Relation Age of Onset  . Heart disease Mother   . Diabetes Father     Past Surgical History:  Procedure Laterality Date  . ABDOMINAL AORTAGRAM N/A 08/22/2011   Procedure:  ABDOMINAL Ronny Flurry;  Surgeon: Nada Libman, MD;  Location: [facility] CATH LAB;  Service: Cardiovascular;  Laterality: N/A;  . aortogram    . PERCUTANEOUS STENT INTERVENTION Bilateral 08/22/2011   Procedure: PERCUTANEOUS STENT INTERVENTION;  Surgeon: Nada Libman, MD;  Location: [facility] CATH LAB;  Service: Cardiovascular;  Laterality: Bilateral;  . Stents  August 22, 2011   Bilateral iliac PTA and stenting    No Known Allergies  Current Outpatient Prescriptions  Medication Sig Dispense Refill  . aspirin 325 MG tablet Take 325 mg by mouth daily.      Marland Kitchen atorvastatin (LIPITOR) 80 MG tablet Take 80 mg by mouth daily.    . celecoxib (CELEBREX) 200 MG capsule Take 200 mg by mouth 2 (two) times daily.      . clopidogrel (PLAVIX) 75 MG tablet Take 1 tablet (75 mg total) by mouth daily. 30 tablet 5  . HYDROcodone-acetaminophen (VICODIN) 5-500 MG per tablet Take 1 tablet by mouth every 8 (eight) hours as needed. For pain    . omeprazole (PRILOSEC) 40 MG capsule Take 40 mg by mouth.    . Pitavastatin Calcium (LIVALO) 4 MG TABS Take 4 mg by mouth daily.    . potassium chloride (K-DUR) 10 MEQ tablet Take 10 mEq by mouth.     No current facility-administered medications for this visit.     ROS: See HPI for  pertinent positives and negatives.   Physical Examination  Vitals:   01/07/16 1145 01/07/16 1146  BP: (!) 168/87 (!) 166/90  Pulse: 75   SpO2: 100%   Weight: 143 lb (64.9 kg)   Height: 5\' 5"  (1.651 m)    Body mass index is 23.8 kg/m.  General: A&O x 3, WDWN. Gait: normal Eyes: PERRLA. Pulmonary: Respirations are non labored. Cardiac: regular rhythm, no detected murmur.     Carotid Bruits Left Right   Negative Negative  Aorta is not palpable. Radial pulses: 2+ and =   VASCULAR EXAM: Extremities without ischemic changes  without Gangrene; without open wounds.      LE Pulses LEFT RIGHT   FEMORAL 2+ palpable faintly palpable    POPLITEAL not palpable  not palpable   POSTERIOR TIBIAL not palpable  not palpable    DORSALIS PEDIS  ANTERIOR TIBIAL 2+ palpable  2+ palpable    Abdomen: soft, non tender, no palpable masses. Skin: no rashes, no ulcers. Musculoskeletal: no muscle wasting or atrophy. Neurologic: A&O X 3; Appropriate Affect ; SENSATION: normal; MOTOR FUNCTION: moving all extremities equally, motor strength 5/5 throughout. Speech is fluent/normal. CN 2-12 intact. Pain in sacrum with resistance to straight leg raising in both legs (positive straight leg raise test).    Non-Invasive Vascular Imaging: DATE: 01/07/2016  ILIAC ARTERY STENT EVALUATION    INDICATION: Follow up stent    PREVIOUS INTERVENTION(S): Bilateral EIA and left CIA stents 12/13/2010; Bilateral CIA stents 08/22/2011    DUPLEX EXAM:     RIGHT  LEFT   Peak Systolic Velocity (cm/s) Ratio (if abnormal) Waveform  Peak Systolic Velocity (cm/s) Ratio (if abnormal) Waveform     Aorta - Distal     136  B Artery - Proximal to Stent 53  B  136  B Stent - Proximal 73  Brisk M  173  B Stent - Mid 98  Brisk M  192  B Stent - Distal 101  Brisk M  131  B Artery - Distal to Stent 73  Brisk M  0.93 Today's ABI / TBI 0.81  0.98 Previous ABI / TBI (  05/05/14) 0.93    Waveform:    M - Monophasic       B - Biphasic       T - Triphasic  If Ankle Brachial Index (ABI) or Toe Brachial Index (TBI) performed, please see complete report     ADDITIONAL FINDINGS:     IMPRESSION: 1. The right common iliac artery and external iliac artery stents appear patent with no visualized stenosis 2. The left common iliac artery and external iliac artery appear patent with dampened waveforms with an increase of velocity in the native external iliac artery  suggestive of  50 - 99% stenosis, see attached    Compared to the previous exam:  Increased velocity on the left       ASSESSMENT: Cameron Ware is a 55 y.o. male who is status post right external iliac stent, left common iliac and external iliac artery stents in July 2012 with bilateral iliac PTA use and stenting in March 2013.  He had a stroke in 2012 as manifested by expressive aphasia and left hemiparesis, still has some left sided weakness and mild expressive aphasia, no further stroke or TIA symptoms. 05/05/14 carotid Duplex suggested less than 40% bilateral internal carotid artery stenosis; no change from 08/10/11 carotid Duplex.  Bilateral iliac artery duplex today suggests the right common iliac artery and external  iliac artery stents appear patent with no visualized stenosis. The left common iliac artery and external iliac artery appear patent with dampened waveforms with an increase of velocity in the native external iliac artery (328 cm/s) suggestive of  50 - 99% stenosis. Increased velocity on the left.   His symptoms, however, are bilateral groin pain at rest, equal in severity. He was evaluated by his PCP recently for hernias and none were detected. He does not c/o buttock or thigh or calf claudication. There are no signs of ischemia in his feet/legs. ABI's have decreased bilaterally: from 98% to 93% on the right; from 93% to 81% on the left compared to 05/05/14. ABI waveforms are all biphasic.  Right TBI is normal at 0.70, left TBI is slightly below normal at 0.69.   He has a positive bilateral straight leg raise test, indicating bilateral radiculopathy. Consider lumbar spine evaluation, will defer to his PCP re this.   PLAN:  Based on the patient's vascular studies and examination, and after discussing with Dr. Imogene Burnhen, pt will return to clinic in 3 months with ABI's and bilateral iliac artery stents duplex, follow up with Dr. Myra GianottiBrabham.   I discussed in depth with  the patient the nature of atherosclerosis, and emphasized the importance of maximal medical management including strict control of blood pressure, blood glucose, and lipid levels, obtaining regular exercise, and continued cessation of smoking.  The patient is aware that without maximal medical management the underlying atherosclerotic disease process will progress, limiting the benefit of any interventions.  The patient was given information about PAD including signs, symptoms, treatment, what symptoms should prompt the patient to seek immediate medical care, and risk reduction measures to take.  Charisse MarchSuzanne Nickel, RN, MSN, FNP-C [facility] [place] Office Phone: [phone]  Clinic MD: Imogene BurnChen  01/07/16 11:51 AM

## 2016-01-28 NOTE — Addendum Note (Signed)
Addended by: Dannielle KarvonenMANESS-HARRISON, Sarthak Rubenstein C on: 01/28/2016 02:48 PM   Modules accepted: Orders

## 2016-02-22 ENCOUNTER — Ambulatory Visit (HOSPITAL_COMMUNITY)
Admission: RE | Admit: 2016-02-22 | Discharge: 2016-02-22 | Disposition: A | Discharge status: Home / Self Care | Payer: Medicare Other | Source: Ambulatory Visit | Attending: Family Medicine | Admitting: Family Medicine

## 2016-02-22 ENCOUNTER — Other Ambulatory Visit (HOSPITAL_COMMUNITY): Payer: Self-pay | Admitting: Family Medicine

## 2016-02-22 DIAGNOSIS — M5441 Lumbago with sciatica, right side: Secondary | ICD-10-CM | POA: Diagnosis present

## 2016-02-22 DIAGNOSIS — K6289 Other specified diseases of anus and rectum: Secondary | ICD-10-CM

## 2016-02-22 DIAGNOSIS — M5136 Other intervertebral disc degeneration, lumbar region: Secondary | ICD-10-CM | POA: Diagnosis not present

## 2016-02-22 DIAGNOSIS — G8929 Other chronic pain: Secondary | ICD-10-CM

## 2016-02-22 DIAGNOSIS — M5442 Lumbago with sciatica, left side: Secondary | ICD-10-CM | POA: Diagnosis present

## 2016-04-06 DIAGNOSIS — K219 Gastro-esophageal reflux disease without esophagitis: Secondary | ICD-10-CM | POA: Insufficient documentation

## 2016-04-13 ENCOUNTER — Encounter (HOSPITAL_COMMUNITY): Payer: Medicare Other

## 2016-04-17 ENCOUNTER — Ambulatory Visit: Payer: Medicare Other | Admitting: Surgery

## 2016-05-19 ENCOUNTER — Encounter: Payer: Self-pay | Admitting: Vascular Surgery

## 2016-06-05 ENCOUNTER — Ambulatory Visit (HOSPITAL_COMMUNITY): Payer: Medicare Other

## 2016-06-05 ENCOUNTER — Ambulatory Visit (HOSPITAL_COMMUNITY): Admission: RE | Admit: 2016-06-05 | Payer: Medicare Other | Source: Ambulatory Visit

## 2016-06-05 ENCOUNTER — Ambulatory Visit: Payer: Medicare Other

## 2016-07-28 ENCOUNTER — Encounter: Payer: Self-pay | Admitting: Family

## 2016-08-04 ENCOUNTER — Ambulatory Visit (INDEPENDENT_AMBULATORY_CARE_PROVIDER_SITE_OTHER)
Admission: RE | Admit: 2016-08-04 | Discharge: 2016-08-04 | Disposition: A | Discharge status: Home / Self Care | Payer: Medicare Other | Source: Ambulatory Visit | Attending: Family | Admitting: Family

## 2016-08-04 ENCOUNTER — Ambulatory Visit (INDEPENDENT_AMBULATORY_CARE_PROVIDER_SITE_OTHER): Payer: Medicare Other | Admitting: Family

## 2016-08-04 ENCOUNTER — Encounter: Payer: Self-pay | Admitting: Family

## 2016-08-04 ENCOUNTER — Ambulatory Visit (HOSPITAL_COMMUNITY)
Admission: RE | Admit: 2016-08-04 | Discharge: 2016-08-04 | Disposition: A | Discharge status: Home / Self Care | Payer: Medicare Other | Source: Ambulatory Visit | Attending: Family | Admitting: Family

## 2016-08-04 VITALS — BP 122/78 | HR 76 | Temp 97.0°F | Resp 18 | Ht 65.0 in | Wt 148.0 lb

## 2016-08-04 DIAGNOSIS — Z87891 Personal history of nicotine dependence: Secondary | ICD-10-CM | POA: Insufficient documentation

## 2016-08-04 DIAGNOSIS — E785 Hyperlipidemia, unspecified: Secondary | ICD-10-CM | POA: Diagnosis not present

## 2016-08-04 DIAGNOSIS — I771 Stricture of artery: Secondary | ICD-10-CM

## 2016-08-04 DIAGNOSIS — I10 Essential (primary) hypertension: Secondary | ICD-10-CM | POA: Insufficient documentation

## 2016-08-04 DIAGNOSIS — Z95828 Presence of other vascular implants and grafts: Secondary | ICD-10-CM

## 2016-08-04 DIAGNOSIS — R1030 Lower abdominal pain, unspecified: Secondary | ICD-10-CM

## 2016-08-04 NOTE — Patient Instructions (Signed)

## 2016-08-04 NOTE — Progress Notes (Signed)
[facility] [place]   CC: Follow up peripheral artery occlusive disease  History of Present Illness Cameron Ware is a 56 y.o. male patient of Cameron Ware who is status post right external iliac stent, left common iliac and external iliac artery stents in July 2012 with bilateral iliac PTA use and stenting in March 2013 with Cameron [facility]. He returns today for follow up.  Both groins and low back hurt when he walks 400 feet, relieved with a few minutes rest, left groin worse than right, denies non healing wounds. He denies claudication symptoms in his thighs or calves. He saw his PCP a couple of months ago re his bilateral groin pain, states he was checked for hernias and does not have any. He was under treatment by a pain management clinic, [facility], [place]. He states the shots in his stomach that they gave him made his low mid and pubis pain worse.  This pain started about 2013.   He had a stroke in 2012 as manifested by expressive aphasia and left hemiparesis, still has some left sided weakness and mild expressive aphasia, denies monocular blindness, no further stroke or TIA symptoms.  Pt Diabetic: No Pt smoker: formersmoker (stopped end of November, 2015, was smoking 1 ppd, started smoking at age 56 yrs)  Pt meds include: Statin :Yes ASA: Yes Other anticoagulants/antiplatelets: Plavix      Past Medical History:  Diagnosis Date  . Anorexia 11/29/2010  . Arthritis   . Chest pain   . Decreased vision   . Depression   . Dyspnea on exertion   . Generalized headaches   . Hyperlipidemia   . Hypertension   . Peripheral vascular disease (HCC)   . Stroke (HCC)   . Urinary frequency     Social History Social History  Substance Use Topics  . Smoking status: Former Smoker    Years: 40.00    Types: Cigarettes    Quit date: 08/04/2012  . Smokeless tobacco: Never Used  . Alcohol use Yes     Comment: OCCASIONALLY     Family History Family History  Problem Relation Age of Onset  . Heart disease Mother   . Diabetes Father     Past Surgical History:  Procedure Laterality Date  . ABDOMINAL AORTAGRAM N/A 08/22/2011   Procedure: ABDOMINAL Cameron [facility];  Surgeon: Cameron LibmanVance W Brabham, MD;  Location: [facility];  Service: Cardiovascular;  Laterality: N/A;  . aortogram    . PERCUTANEOUS STENT INTERVENTION Bilateral 08/22/2011   Procedure: PERCUTANEOUS STENT INTERVENTION;  Surgeon: Cameron LibmanVance W Brabham, MD;  Location: [facility];  Service: Cardiovascular;  Laterality: Bilateral;  . Stents  August 22, 2011   Bilateral iliac PTA and stenting    No Known Allergies  Current Outpatient Prescriptions  Medication Sig Dispense Refill  . aspirin 325 MG tablet Take 325 mg by mouth daily.      Marland Kitchen. atorvastatin (LIPITOR) 80 MG tablet Take 80 mg by mouth daily.    . celecoxib (CELEBREX) 200 MG capsule Take 200 mg by mouth 2 (two) times daily.      . clopidogrel (PLAVIX) 75 MG tablet Take 1 tablet (75 mg total) by mouth daily. 30 tablet 5  . omeprazole (PRILOSEC) 40 MG capsule Take 40 mg by mouth.    . Pitavastatin Calcium (LIVALO) 4 MG TABS Take 4 mg by mouth daily.    Marland Kitchen. HYDROcodone-acetaminophen (VICODIN) 5-500 MG per tablet Take 1 tablet by mouth every  8 (eight) hours as needed. For pain    . potassium chloride (K-DUR) 10 MEQ tablet Take 10 mEq by mouth.     No current facility-administered medications for this visit.     ROS: See HPI for pertinent positives and negatives.   Physical Examination  Vitals:   08/04/16 1036 08/04/16 1040  BP: 134/78 122/78  Pulse: 80 76  Resp: 18   Temp: 97 F (36.1 C)   TempSrc: Oral   SpO2: 97%   Weight: 148 lb (67.1 kg)   Height: 5\' 5"  (1.651 m)    Body mass index is 24.63 kg/m.  General: A&O x 3, WDWN. Gait: normal Eyes: PERRLA. Pulmonary: Respirations are non labored. Cardiac: regular rhythm, no detected murmur.     Carotid Bruits Left Right   Negative  Negative  Aorta is not palpable. Radial pulses: 2+ and =   VASCULAR EXAM: Extremitieswithout ischemic changes  without Gangrene; without open wounds.     LE Pulses LEFT RIGHT   FEMORAL 2+ palpable faintly palpable    POPLITEAL not palpable  not palpable   POSTERIOR TIBIAL not palpable  not palpable    DORSALIS PEDIS  ANTERIOR TIBIAL 2+ palpable  2+ palpable    Abdomen: soft, non tender, no palpable masses. Skin: no rashes, no ulcers. Musculoskeletal: no muscle wasting or atrophy. Neurologic: A&O X 3; Appropriate Affect ; SENSATION: normal; MOTOR FUNCTION: moving all extremities equally, motor strength 5/5 throughout. Speech is fluent/normal. CN 2-12 intact. Pain in sacrum with resistance to straight leg raising in both legs (positive straight leg raise test).    ASSESSMENT: Cameron Ware is a 56 y.o. male who is status post right external iliac stent, left common iliac and external iliac artery stents in July 2012 with bilateral iliac PTA use and stenting in March 2013.  He had a stroke in 2012 as manifested by expressive aphasia and left hemiparesis, still has some left sided weakness and mild expressive aphasia, no further stroke or TIA symptoms.  Pt reports low mid abdominal pain since about 2013. He also reports bilateral groin pain at rest, equal in severity. He was evaluated by his PCP recently for hernias and none were detected. He does not c/o buttock or thigh or calf claudication. There are no signs of ischemia in his feet/legs.  He has a positive bilateral straight leg raise test, indicating bilateral radiculopathy. Consider lumbar spine evaluation, will defer to his PCP re this.  DATA 05/05/14 carotid Duplex suggested less than 40%  bilateral internal carotid artery stenosis; no change from 08/10/11 carotid Duplex.  Bilateral iliac artery duplex today suggests left distal CFA velocity of 224 cm/s. Right CIA and EIA stents with no significant stenosis.  Unable to obtain similar velocities in the distal native EIA compared to the last exam on 01-07-16.   ABI's today : Right: 0.77 (0.93, 01-07-16), waveforms: triphasic; TBI: 0.75 (was 0.70) Left: 0.77 (was 0.81), waveforms: PT: mono;, DP: biphasic; TBI: 0.87 (was 0.69) Bilateral ABI's have declined, but bilateral TBI's have improved.     PLAN:  Based on the patient's vascular studies and examination, and after discussing with Cameron Ware, pt will be scheduled for CTA abd/pelvis to try to determine etiology of low mid abdominal and bilateral groin pain which he indicates that he has had since about 2013. Follow up with Cameron Ware in 1-2 months  I discussed in depth with the patient the nature of atherosclerosis, and emphasized the importance of maximal medical management including strict  control of blood pressure, blood glucose, and lipid levels, obtaining regular exercise, and continued cessation of smoking.  The patient is aware that without maximal medical management the underlying atherosclerotic disease process will progress, limiting the benefit of any interventions.  The patient was given information about PAD including signs, symptoms, treatment, what symptoms should prompt the patient to seek immediate medical care, and risk reduction measures to take.  Charisse March, RN, MSN, FNP-C [facility] [place] Phone: [phone]  Clinic MD: Randie Ware  08/04/16 10:48 AM

## 2016-08-11 LAB — HM HEPATITIS C SCREENING LAB: HM Hepatitis Screen: NEGATIVE

## 2016-08-11 NOTE — Addendum Note (Signed)
Addended by: Burton ApleyPETTY, Miko Sirico A on: 08/11/2016 09:43 AM   Modules accepted: Orders

## 2016-11-13 ENCOUNTER — Encounter (HOSPITAL_COMMUNITY): Payer: Self-pay | Admitting: Emergency Medicine

## 2016-11-13 ENCOUNTER — Emergency Department (HOSPITAL_COMMUNITY): Payer: Medicare Other

## 2016-11-13 ENCOUNTER — Emergency Department (HOSPITAL_COMMUNITY)
Admission: EM | Admit: 2016-11-13 | Discharge: 2016-11-13 | Disposition: A | Discharge status: Home / Self Care | Payer: Medicare Other | Attending: Emergency Medicine | Admitting: Emergency Medicine

## 2016-11-13 DIAGNOSIS — K551 Chronic vascular disorders of intestine: Secondary | ICD-10-CM | POA: Insufficient documentation

## 2016-11-13 DIAGNOSIS — Z7902 Long term (current) use of antithrombotics/antiplatelets: Secondary | ICD-10-CM | POA: Insufficient documentation

## 2016-11-13 DIAGNOSIS — I10 Essential (primary) hypertension: Secondary | ICD-10-CM | POA: Diagnosis not present

## 2016-11-13 DIAGNOSIS — I771 Stricture of artery: Secondary | ICD-10-CM

## 2016-11-13 DIAGNOSIS — Z79899 Other long term (current) drug therapy: Secondary | ICD-10-CM | POA: Insufficient documentation

## 2016-11-13 DIAGNOSIS — R1030 Lower abdominal pain, unspecified: Secondary | ICD-10-CM | POA: Diagnosis present

## 2016-11-13 DIAGNOSIS — I774 Celiac artery compression syndrome: Secondary | ICD-10-CM | POA: Diagnosis not present

## 2016-11-13 DIAGNOSIS — Z7982 Long term (current) use of aspirin: Secondary | ICD-10-CM | POA: Insufficient documentation

## 2016-11-13 DIAGNOSIS — Z87891 Personal history of nicotine dependence: Secondary | ICD-10-CM | POA: Insufficient documentation

## 2016-11-13 LAB — BASIC METABOLIC PANEL
Anion gap: 10 (ref 5–15)
BUN: 10 mg/dL (ref 6–20)
CHLORIDE: 102 mmol/L (ref 101–111)
CO2: 25 mmol/L (ref 22–32)
Calcium: 9.5 mg/dL (ref 8.9–10.3)
Creatinine, Ser: 0.86 mg/dL (ref 0.61–1.24)
GFR calc Af Amer: >60 mL/min (ref >60)
GFR calc non Af Amer: >60 mL/min (ref >60)
GLUCOSE: 97 mg/dL (ref 65–99)
POTASSIUM: 3.5 mmol/L (ref 3.5–5.1)
Sodium: 137 mmol/L (ref 135–145)

## 2016-11-13 LAB — CBC
HCT: 43.8 % (ref 39.0–52.0)
HEMOGLOBIN: 15.5 g/dL (ref 13.0–17.0)
MCH: 32.4 pg (ref 26.0–34.0)
MCHC: 35.4 g/dL (ref 30.0–36.0)
MCV: 91.6 fL (ref 78.0–100.0)
PLATELETS: 276 10*3/uL (ref 150–400)
RBC: 4.78 MIL/uL (ref 4.22–5.81)
RDW: 12.1 % (ref 11.5–15.5)
WBC: 8.3 10*3/uL (ref 4.0–10.5)

## 2016-11-13 MED ORDER — SODIUM CHLORIDE 0.9 % IV BOLUS (SEPSIS)
500.0000 mL | Freq: Once | INTRAVENOUS | Status: AC
  Administered 2016-11-13: 500 mL via INTRAVENOUS

## 2016-11-13 MED ORDER — IOPAMIDOL (ISOVUE-370) INJECTION 76%
100.0000 mL | Freq: Once | INTRAVENOUS | Status: AC | PRN
  Administered 2016-11-13: 100 mL via INTRAVENOUS

## 2016-11-13 NOTE — ED Triage Notes (Signed)
Pt had stents to bilateral groin area x 4 yrs ago. Pt c/o tearing/discomfort pain to lower abd x 2 years. pcp and vascular docs have done US and has been negative. lpt states wants to know what is causing pain. Denies n/v/d.

## 2016-11-13 NOTE — ED Provider Notes (Signed)
[facility]-EM[facility]Y DEPT Provider Note   CSN: [id number] Arrival date & time: 11/13/16  1221     History   Chief Complaint Chief Complaint  Patient presents with  . Abdominal Pain    HPI Cameron Ware is a 56 y.o. male.  HPI 56 year old man history of peripheral vascular disease status post right external iliac stent, left common iliac, and external iliac artery stents in July 2012 who presents today complaining of lower abdominal pain for several years. He states that he was seen by the vascular surgeon and was supposed to be scheduled for a CT. He states he has not been able to have this scheduled and has called the office. He is unclear as to why he has not obtained a CT. He states that when he goes to his doctor they refer him back. He has not had any pain in his lower extremities. The pain has not worsened in nature. He describes it as sharp with any movement. He denies any vomiting, diarrhea, urinary tract infection symptoms. He has not had fever or chills. Past Medical History:  Diagnosis Date  . Anorexia 11/29/2010  . Arthritis   . Chest pain   . Decreased vision   . Depression   . Dyspnea on exertion   . Generalized headaches   . Hyperlipidemia   . Hypertension   . Peripheral vascular disease (HCC)   . Stroke (HCC)   . Urinary frequency     Patient Active Problem List   Diagnosis Date Noted  . Peripheral vascular disease, unspecified (HCC) 04/23/2012  . Pain in limb 04/23/2012  . Occlusion and stenosis of carotid artery without mention of cerebral infarction 08/14/2011  . Atherosclerosis of native arteries of the extremities with intermittent claudication 08/14/2011    Past Surgical History:  Procedure Laterality Date  . ABDOMINAL AORTAGRAM N/A 08/22/2011   Procedure: ABDOMINAL Ronny FlurryAORTAGRAM;  Surgeon: Nada LibmanVance W Brabham, MD;  [facility];  Service: Cardiovascular;  Laterality: N/A;  . aortogram    . PERCUTANEOUS STENT INTERVENTION Bilateral 08/22/2011   Procedure: PERCUTANEOUS STENT INTERVENTION;  Surgeon: Nada LibmanVance W Brabham, MD;  [facility];  Service: Cardiovascular;  Laterality: Bilateral;  . Stents  August 22, 2011   Bilateral iliac PTA and stenting       Home Medications    Prior to Admission medications   Medication Sig Start Date End Date Taking? Authorizing Provider  aspirin 325 MG tablet Take 325 mg by mouth daily.      [provider]  atorvastatin (LIPITOR) 80 MG tablet Take 80 mg by mouth daily.    [provider]  celecoxib (CELEBREX) 200 MG capsule Take 200 mg by mouth 2 (two) times daily.      [provider]  clopidogrel (PLAVIX) 75 MG tablet Take 1 tablet (75 mg total) by mouth daily. 07/02/13   Pryor OchoaLawson, James D, MD  HYDROcodone-acetaminophen (VICODIN) 5-500 MG per tablet Take 1 tablet by mouth every 8 (eight) hours as needed. For pain    [provider]  omeprazole (PRILOSEC) 40 MG capsule Take 40 mg by mouth. 02/04/14   [provider]  Pitavastatin Calcium (LIVALO) 4 MG TABS Take 4 mg by mouth daily.    [provider]  potassium chloride (K-DUR) 10 MEQ tablet Take 10 mEq by mouth. 09/30/13 09/30/14  [provider]    Family History Family History  Problem Relation Age of Onset  . Heart disease Mother   . Diabetes Father  Social History Social History  Substance Use Topics  . Smoking status: Former Smoker    Years: 40.00    Types: Cigarettes    Quit date: 08/04/2012  . Smokeless tobacco: Never Used  . Alcohol use Yes     Comment: OCCASIONALLY     Allergies   Patient has no known allergies.   Review of Systems Review of Systems  All other systems reviewed and are negative.    Physical Exam Updated Vital Signs BP (!) 147/80 (BP Location: Right Arm)   Pulse 85   Temp 98 F (36.7 C) (Oral)   Resp 17   Ht 1.651 m (5\' 5" )   Wt 65.8 kg (145 lb)   SpO2 98%   BMI 24.13 kg/m   Physical Exam  Constitutional: He is oriented to  person, place, and time. He appears well-developed.  HENT:  Head: Normocephalic and atraumatic.  Right Ear: External ear normal.  Left Ear: External ear normal.  Nose: Nose normal.  Eyes: EOM are normal.  Neck: No tracheal deviation present.  Cardiovascular: Normal rate and regular rhythm.   Pulmonary/Chest: Effort normal and breath sounds normal.  Abdominal: Soft. Bowel sounds are normal. He exhibits no distension and no mass. There is tenderness. There is no guarding.  Mild suprapubic tenderness to palpation with no rebound. Remainder of abdominal exam is normal.  Musculoskeletal: Normal range of motion.  Dorsal pedalis pulses are intact bilaterally. Toes and feet are warm with good sensation.  Neurological: He is alert and oriented to person, place, and time.  Skin: Skin is warm and dry.  Psychiatric: He has a normal mood and affect. His behavior is normal.  Nursing note and vitals reviewed.    ED Treatments / Results  Labs (all labs ordered are listed, but only abnormal results are displayed) Labs Reviewed - No data to display  EKG  EKG Interpretation None       Radiology Ct Angio Aortobifemoral W And/or Wo Contrast  Result Date: 11/13/2016 CLINICAL DATA:  Lower abdominal pain EXAM: CTA ABDOMEN AND PELVIS wITHOUT AND WITH CONTRAST, CT ANGIO RUNOFF TECHNIQUE: Multidetector CT imaging of the abdomen and pelvis was performed using the standard protocol during bolus administration of intravenous contrast. Multiplanar reconstructed images and MIPs were obtained and reviewed to evaluate the vascular anatomy. CONTRAST:  100 cc Isovue 370 COMPARISON:  None. FINDINGS: VASCULAR Aorta: The aorta is non aneurysmal and patent. Atherosclerotic calcifications and minimal plaque in the lower abdominal aorta is noted. Celiac: 70% narrowing at the origin primarily due to atherosclerosis. Branch vessels patent. SMA: 70 to 80% narrowing is present just beyond the origin secondary to  atherosclerotic plaque. Renals: Single renal arteries are patent. IMA: Patent and diminutive. Inflow: Right: There is a stent in the right common iliac artery. It is widely patent. There is also a stent in the external iliac artery. It is also patent with diffuse atherosclerotic changes. Moderate atherosclerotic disease involving the internal iliac artery. Left: There are stents in the left common and external iliac arteries. There is no significant focal narrowing in these vessels. Moderate atherosclerotic disease in the left internal iliac artery. Outflow: Right: Atherosclerotic changes of the right common femoral artery are present without significant focal narrowing. There is a focal occlusion of the right profunda femoral artery at its origin. The vessel reconstitutes. There are diffuse atherosclerotic changes of the superficial femoral artery without significant narrowing. Left: There are atherosclerotic changes of the left common femoral artery with irregular plaque and a  non flow limiting chronic appearing dissection. There is probably no significant focal narrowing in this vessel. Profunda femoral artery branches are patent. The superficial femoral artery is diffusely diseased but there is no significant focal apparent occlusion or stenosis. RUNOFF: Right: Right popliteal artery is patent. The peroneal artery is patent to the ankle for 1 vessel runoff. Left: The popliteal artery is patent. Anterior tibial and peroneal arteries are patent to the ankle for 2 vessel runoff. Review of the MIP images confirms the above findings. NON-VASCULAR Lower chest: Mild dependent atelectasis Hepatobiliary: Liver and gallbladder are unremarkable. Pancreas: Unremarkable Spleen: Unremarkable Adrenals/Urinary Tract: Adrenal glands are within normal limits. Left kidney is unremarkable. 4 mm calculus in the mid right kidney anteriorly. Bladder is significantly distended. Stomach/Bowel: Small hiatal hernia. Remainder of the  stomach is unremarkable. There is no focal mass in the colon. Appendix is within normal limits. There is no evidence of small-bowel obstruction. Sigmoid diverticulosis without evidence of acute diverticulitis. Lymphatic: There is no evidence of retroperitoneal adenopathy by measurement criteria. Reproductive: A keyhole deformity in the prostate suggests prior TURP. Prostate is within normal limits in size. Other: No free-fluid. Musculoskeletal: No vertebral compression deformity. IMPRESSION: VASCULAR There is 70% narrowing at the origin of the celiac axis and 70 to 80% narrowing just beyond the origin of the SMA. Correlate with a history of postprandial pain and weight loss. There are stents in the bilateral common and external iliac arteries. There is no significant narrowing in the iliac arterial system. No significant narrowing in the superficial femoral artery, femoral arteries, or popliteal arteries. One vessel runoff on the right and two-vessel runoff on the left. NON-VASCULAR Bladder distention. Right nephrolithiasis. Electronically Signed   By: Jolaine Click M.D.   On: 11/13/2016 16:02   Ct Angio Abd/pel W And/or Wo Contrast  Result Date: 11/13/2016 CLINICAL DATA:  Lower abdominal pain EXAM: CTA ABDOMEN AND PELVIS wITHOUT AND WITH CONTRAST, CT ANGIO RUNOFF TECHNIQUE: Multidetector CT imaging of the abdomen and pelvis was performed using the standard protocol during bolus administration of intravenous contrast. Multiplanar reconstructed images and MIPs were obtained and reviewed to evaluate the vascular anatomy. CONTRAST:  100 cc Isovue 370 COMPARISON:  None. FINDINGS: VASCULAR Aorta: The aorta is non aneurysmal and patent. Atherosclerotic calcifications and minimal plaque in the lower abdominal aorta is noted. Celiac: 70% narrowing at the origin primarily due to atherosclerosis. Branch vessels patent. SMA: 70 to 80% narrowing is present just beyond the origin secondary to atherosclerotic plaque. Renals:  Single renal arteries are patent. IMA: Patent and diminutive. Inflow: Right: There is a stent in the right common iliac artery. It is widely patent. There is also a stent in the external iliac artery. It is also patent with diffuse atherosclerotic changes. Moderate atherosclerotic disease involving the internal iliac artery. Left: There are stents in the left common and external iliac arteries. There is no significant focal narrowing in these vessels. Moderate atherosclerotic disease in the left internal iliac artery. Outflow: Right: Atherosclerotic changes of the right common femoral artery are present without significant focal narrowing. There is a focal occlusion of the right profunda femoral artery at its origin. The vessel reconstitutes. There are diffuse atherosclerotic changes of the superficial femoral artery without significant narrowing. Left: There are atherosclerotic changes of the left common femoral artery with irregular plaque and a non flow limiting chronic appearing dissection. There is probably no significant focal narrowing in this vessel. Profunda femoral artery branches are patent. The superficial femoral artery is diffusely  diseased but there is no significant focal apparent occlusion or stenosis. RUNOFF: Right: Right popliteal artery is patent. The peroneal artery is patent to the ankle for 1 vessel runoff. Left: The popliteal artery is patent. Anterior tibial and peroneal arteries are patent to the ankle for 2 vessel runoff. Review of the MIP images confirms the above findings. NON-VASCULAR Lower chest: Mild dependent atelectasis Hepatobiliary: Liver and gallbladder are unremarkable. Pancreas: Unremarkable Spleen: Unremarkable Adrenals/Urinary Tract: Adrenal glands are within normal limits. Left kidney is unremarkable. 4 mm calculus in the mid right kidney anteriorly. Bladder is significantly distended. Stomach/Bowel: Small hiatal hernia. Remainder of the stomach is unremarkable. There is no  focal mass in the colon. Appendix is within normal limits. There is no evidence of small-bowel obstruction. Sigmoid diverticulosis without evidence of acute diverticulitis. Lymphatic: There is no evidence of retroperitoneal adenopathy by measurement criteria. Reproductive: A keyhole deformity in the prostate suggests prior TURP. Prostate is within normal limits in size. Other: No free-fluid. Musculoskeletal: No vertebral compression deformity. IMPRESSION: VASCULAR There is 70% narrowing at the origin of the celiac axis and 70 to 80% narrowing just beyond the origin of the SMA. Correlate with a history of postprandial pain and weight loss. There are stents in the bilateral common and external iliac arteries. There is no significant narrowing in the iliac arterial system. No significant narrowing in the superficial femoral artery, femoral arteries, or popliteal arteries. One vessel runoff on the right and two-vessel runoff on the left. NON-VASCULAR Bladder distention. Right nephrolithiasis. Electronically Signed   By: Jolaine Click M.D.   On: 11/13/2016 16:02    Procedures Procedures (including critical care time)  Medications Ordered in ED Medications - No data to display   Initial Impression / Assessment and Plan / ED Course  I have reviewed the triage vital signs and the nursing notes.  Pertinent labs & imaging results that were available during my care of the patient were reviewed by me and considered in my medical decision making (see chart for details).     I have discussed options for workup with patient, however given his duration of symptoms and previous evaluations, does not appear to be clinically indicated in the emergency department. I have attempted to call the vascular and vein surgeon to assist the patient in scheduling his CT angiogram. Discussed with Ms. Nickel on for vascular surgery and patient to call this week for follow up. Appointment made for 2 PM on Wednesday Final Clinical  Impressions(s) / ED Diagnoses   Final diagnoses:  Celiac artery stenosis (HCC)  SMA stenosis (HCC)    New Prescriptions New Prescriptions   No medications on file     Margarita Grizzle, MD 11/13/16 1645

## 2016-11-13 NOTE — Discharge Instructions (Signed)
Please follow-up with your vascular surgeon.

## 2016-11-15 ENCOUNTER — Encounter: Payer: Self-pay | Admitting: Vascular Surgery

## 2016-11-15 ENCOUNTER — Ambulatory Visit (INDEPENDENT_AMBULATORY_CARE_PROVIDER_SITE_OTHER): Payer: Medicare Other | Admitting: Vascular Surgery

## 2016-11-15 VITALS — BP 121/74 | HR 95 | Temp 98.4°F | Resp 18 | Ht 65.0 in | Wt 138.0 lb

## 2016-11-15 DIAGNOSIS — R1030 Lower abdominal pain, unspecified: Secondary | ICD-10-CM

## 2016-11-15 DIAGNOSIS — I739 Peripheral vascular disease, unspecified: Secondary | ICD-10-CM | POA: Diagnosis not present

## 2016-11-15 DIAGNOSIS — I771 Stricture of artery: Secondary | ICD-10-CM | POA: Diagnosis not present

## 2016-11-15 NOTE — Progress Notes (Signed)
[name], male   DOB: 1961/04/15, 56 y.o.   MRN: [id number]  Reason for Consult: New Evaluation (abnormal CT)   Referred by Joette Catching, MD  Subjective:     HPI:  Cameron Ware is a 56 y.o. male with history of bilateral iliac artery stenting. He also has a long history of chronic abdominal pain that is located in his lower abdomen. He states it starts in the right to the left and is constant. He is not on any alleviating factors despite seeing a pain clinic in [facility] past. There are also no exacerbating factors. He states that he has lost a little weight in the recent future. He has not had a colonoscopy ever and he is now 56 years old. From a leg standpoint he is doing well does not complain of lower [place] pain at this time and underwent ABIs as recently as March. CTA was performed prior to this visit and reviewed today with the patient. He is not having any constipation and is having occasional diarrhea.  Past Medical History:  Diagnosis Date  . Anorexia 11/29/2010  . Arthritis   . Chest pain   . Decreased vision   . Depression   . Dyspnea on exertion   . Generalized headaches   . Hyperlipidemia   . Hypertension   . Peripheral vascular disease (HCC)   . Stroke (HCC)   . Urinary frequency    Family History  Problem Relation Age of Onset  . Heart disease Mother   . Diabetes Father    Past Surgical History:  Procedure Laterality Date  . ABDOMINAL AORTAGRAM N/A 08/22/2011   Procedure: ABDOMINAL Ronny Flurry;  Surgeon: Nada Libman, MD;  Location: [facility] C[facility] LAB;  Service: Cardiovascular;  Laterality: N/A;  . aortogram    . PERCUTANEOUS STENT INTERVENTION Bilateral 08/22/2011   Procedure: PERCUTANEOUS STENT INTERVENTION;  Surgeon: Nada Libman, MD;  Location: [facility] CATH LAB;  Service: Cardiovascular;  Laterality: Bilateral;  . Stents  August 22, 2011   Bilateral iliac PTA and stenting    Short Social History:  Social History    Substance Use Topics  . Smoking status: Former Smoker    Years: 40.00    Types: Cigarettes    Quit date: 08/04/2012  . Smokeless tobacco: Never Used  . Alcohol use Yes     Comment: OCCASIONALLY    No Known Allergies  Current Outpatient Prescriptions  Medication Sig Dispense Refill  . acetaminophen (TYLENOL) 500 MG tablet Take 500 mg by mouth every 6 (six) hours as needed.    Marland Kitchen aspirin 325 MG tablet Take 325 mg by mouth daily.      Marland Kitchen atorvastatin (LIPITOR) 80 MG tablet Take 80 mg by mouth daily.    . celecoxib (CELEBREX) 200 MG capsule Take 200 mg by mouth 2 (two) times daily.      . clopidogrel (PLAVIX) 75 MG tablet Take 1 tablet (75 mg total) by mouth daily. 30 tablet 5  . meloxicam (MOBIC) 7.5 MG tablet Take 1 tablet by mouth 2 (two) times daily.    Marland Kitchen omeprazole (PRILOSEC) 40 MG capsule Take 40 mg by mouth.    . Pitavastatin Calcium (LIVALO) 4 MG TABS Take 4 mg by mouth daily.    . potassium chloride (K-DUR) 10 MEQ tablet Take 1 tablet by mouth 4 (four) times daily.     No current facility-administered medications for this visit.     Review of Systems  Constitutional:  Positive for unexpected weight change.  Respiratory: Positive for shortness of breath.  GI: Positive for abdominal pain.  Musculoskeletal: Musculoskeletal negative.  Skin: Skin negative.  Neurological: Positive for focal weakness.  Hematologic: Hematologic/lymphatic negative.  Psychiatric: Positive for depressed mood.        Objective:  Objective   Vitals:   11/15/16 1408  BP: 121/74  Pulse: 95  Resp: 18  Temp: 98.4 F (36.9 C)  SpO2: 97%  Weight: 138 lb (62.6 kg)  Height: 5\' 5"  (1.651 m)   Body mass index is 22.96 kg/m.  Physical Exam  Constitutional: He is oriented to person, place, and time. He appears well-developed.  HENT:  Head: Normocephalic.  Eyes: Pupils are equal, round, and reactive to light.  Neck: Normal range of motion.  Cardiovascular: Normal rate.   Pulses:      Femoral  pulses are 1+ on the right side, and 1+ on the left side. Pulmonary/Chest: Effort normal.  Abdominal: Soft. He exhibits no distension and no mass. There is tenderness. There is no rebound and no guarding.  Musculoskeletal: He exhibits no edema.  Neurological: He is alert and oriented to person, place, and time.  Skin: Skin is warm and dry.  Psychiatric: He has a normal mood and affect. His behavior is normal. Judgment and thought content normal.    Data: IMPRESSION: VASCULAR  There is 70% narrowing at the origin of the celiac axis and 70 to 80% narrowing just beyond the origin of the SMA. Correlate with a history of postprandial pain and weight loss.  There are stents in the bilateral common and external iliac arteries. There is no significant narrowing in the iliac arterial system.  No significant narrowing in the superficial femoral artery, femoral arteries, or popliteal arteries. One vessel runoff on the right and two-vessel runoff on the left.      Assessment/Plan:     56yo WM with history of bilateral iliac artery stents. He is now having lower abdominal pain that is persistent and non radiating. He does have celiac and sma stenosis but his pain is not really consistent with vascular pain as he eats without issues and does not appear to be losing significant weight. He does need a colonoscopy given his age and perhaps his pain is related to gi issues as the CT does not demonstrate any underlying cause. He can f/u in 6 months with AI duplex and mesenteric duplex.      Maeola HarmanBrandon Christopher Shanee Batch MD [facility]

## 2016-11-16 NOTE — Addendum Note (Signed)
Addended by: Burton ApleyPETTY, Tascha Casares A on: 11/16/2016 12:24 PM   Modules accepted: Orders

## 2016-12-21 ENCOUNTER — Encounter (INDEPENDENT_AMBULATORY_CARE_PROVIDER_SITE_OTHER): Payer: Self-pay | Admitting: Internal Medicine

## 2017-01-08 ENCOUNTER — Ambulatory Visit (INDEPENDENT_AMBULATORY_CARE_PROVIDER_SITE_OTHER): Payer: Medicare Other | Admitting: Internal Medicine

## 2017-01-30 ENCOUNTER — Ambulatory Visit (INDEPENDENT_AMBULATORY_CARE_PROVIDER_SITE_OTHER): Payer: Medicare Other | Admitting: Internal Medicine

## 2017-01-30 ENCOUNTER — Telehealth (INDEPENDENT_AMBULATORY_CARE_PROVIDER_SITE_OTHER): Payer: Self-pay | Admitting: *Deleted

## 2017-01-30 ENCOUNTER — Other Ambulatory Visit (INDEPENDENT_AMBULATORY_CARE_PROVIDER_SITE_OTHER): Payer: Self-pay | Admitting: Internal Medicine

## 2017-01-30 ENCOUNTER — Encounter (INDEPENDENT_AMBULATORY_CARE_PROVIDER_SITE_OTHER): Payer: Self-pay | Admitting: Internal Medicine

## 2017-01-30 ENCOUNTER — Encounter (INDEPENDENT_AMBULATORY_CARE_PROVIDER_SITE_OTHER): Payer: Self-pay | Admitting: *Deleted

## 2017-01-30 ENCOUNTER — Encounter (INDEPENDENT_AMBULATORY_CARE_PROVIDER_SITE_OTHER): Payer: Self-pay

## 2017-01-30 DIAGNOSIS — R109 Unspecified abdominal pain: Secondary | ICD-10-CM

## 2017-01-30 DIAGNOSIS — E7800 Pure hypercholesterolemia, unspecified: Secondary | ICD-10-CM | POA: Diagnosis not present

## 2017-01-30 DIAGNOSIS — E785 Hyperlipidemia, unspecified: Secondary | ICD-10-CM | POA: Insufficient documentation

## 2017-01-30 HISTORY — DX: Pure hypercholesterolemia, unspecified: E78.00

## 2017-01-30 MED ORDER — PEG 3350-KCL-NA BICARB-NACL 420 G PO SOLR: 4000.0000 mL | Freq: Once | ORAL | 0 refills | Status: AC

## 2017-01-30 NOTE — Patient Instructions (Signed)
Stop the Celebrex and Meloxicam.  Colonoscopy. The risks of bleeding, perforation and infection were reviewed with patient.

## 2017-01-30 NOTE — Progress Notes (Signed)
Subjective:    [name], male    DOB: 06-04-1960, 56 y.o.   MRN: [id number]  HPI Referred by Dr. Lysbeth Galas for GERD/Colonoscopy. C/o lower abdominal pain. Has had pain for a couple of years. The pain is constant.  He says if he pick something up or strains himself, he has pain.  He denies any acid reflux. Pain does not get worse after eating. Eats twice a day.  Quit smoking 4 years ago.  Has a BM daily. Nol melena or BRRB.  Occasionally has diarrhea.  Seen in the ED in June with abdominal pain. CT scan revealed There is 70% narrowing at the origin of the celiac axis and 70 to 80% narrowing just beyond the origin of the SMA. Correlate with a history of postprandial pain and weight loss.  Referred  to Dr. Apolinar Junes Cain(Vascular and Vein Specialist) in June. Per Dr. Darcella Cheshire  Records, noted to have celiac and sma stenosis but pain is not consistent with vascular pain as he eats without issues and does not appear to be losing significant weight.      Hx of peripheral arterial disease. Hx of iliac endarterectomy bypass surgery.    Maintained on Plavix and ASA  [id number] Hand H 16.0 and 46.3, total bili 0.4, ALP 90, AST 22, ALT 20.  Review of Systems Past Medical History:  Diagnosis Date  . Anorexia 11/29/2010  . Arthritis   . Chest pain   . Decreased vision   . Depression   . Dyspnea on exertion   . Generalized headaches   . High cholesterol 01/30/2017  . Hyperlipidemia   . Hypertension   . Peripheral vascular disease (HCC)   . Stroke (HCC)   . Urinary frequency     Past Surgical History:  Procedure Laterality Date  . ABDOMINAL AORTAGRAM N/A 08/22/2011   Procedure: ABDOMINAL Ronny Flurry;  Surgeon: Nada Libman, MD;  Location: [facility] CATH LAB;  Service: Cardiovascular;  Laterality: N/A;  . aortogram    . PERCUTANEOUS STENT INTERVENTION Bilateral 08/22/2011   Procedure: PERCUTANEOUS STENT INTERVENTION;  Surgeon: Nada Libman, MD;  Location: [facility] CATH LAB;  Service:  Cardiovascular;  Laterality: Bilateral;  . Stents  August 22, 2011   Bilateral iliac PTA and stenting    No Known Allergies  Current Outpatient Prescriptions on File Prior to Visit  Medication Sig Dispense Refill  . acetaminophen (TYLENOL) 500 MG tablet Take 500 mg by mouth every 6 (six) hours as needed.    Marland Kitchen aspirin 325 MG tablet Take 325 mg by mouth daily.      Marland Kitchen atorvastatin (LIPITOR) 80 MG tablet Take 80 mg by mouth daily.    . celecoxib (CELEBREX) 200 MG capsule Take 200 mg by mouth 2 (two) times daily.      . clopidogrel (PLAVIX) 75 MG tablet Take 1 tablet (75 mg total) by mouth daily. 30 tablet 5  . meloxicam (MOBIC) 7.5 MG tablet Take 1 tablet by mouth 2 (two) times daily.    Marland Kitchen omeprazole (PRILOSEC) 40 MG capsule Take 40 mg by mouth.    . Pitavastatin Calcium (LIVALO) 4 MG TABS Take 4 mg by mouth daily.    . potassium chloride (K-DUR) 10 MEQ tablet Take 1 tablet by mouth 4 (four) times daily.    . [DISCONTINUED] pravastatin (PRAVACHOL) 40 MG tablet Take 40 mg by mouth daily.       No current facility-administered medications on file prior to visit.  Objective:   Physical Exam Blood pressure (!) 144/80, pulse 80, temperature (!) 97.5 F (36.4 C), height 5\' 5"  (1.651 m), weight 144 lb 1.6 oz (65.4 kg). Alert and oriented. Skin warm and dry. Oral mucosa is moist.   . Sclera anicteric, conjunctivae is pink. Thyroid not enlarged. No cervical lymphadenopathy. Lungs clear. Heart regular rate and rhythm.  Abdomen is soft. Bowel sounds are positive. No hepatomegaly. No abdominal masses felt.Tenderness just below umbilicus (slight).   No edema to lower extremities.           Assessment & Plan:  Abdominal pain. Has never undergone a colonoscopy in the past.  Has seen Vascular surgeon recently and his impression was pain was not ischemic. This pain per patient is constant.  Colonoscopy.  Needs to stop the Celebrex and Meloxicam.

## 2017-01-30 NOTE — Telephone Encounter (Signed)
Patient needs trilyte 

## 2017-01-31 ENCOUNTER — Telehealth (INDEPENDENT_AMBULATORY_CARE_PROVIDER_SITE_OTHER): Payer: Self-pay | Admitting: *Deleted

## 2017-01-31 NOTE — Telephone Encounter (Signed)
Per Dr Lysbeth GalasNyland it is ok for patient to stop Plavix 5 days and ASA 2 days prior to TCS sch'd 03/23/17 -- patient aware

## 2017-03-23 ENCOUNTER — Ambulatory Visit (HOSPITAL_COMMUNITY): Admission: RE | Admit: 2017-03-23 | Payer: Medicare Other | Source: Ambulatory Visit | Admitting: Internal Medicine

## 2017-03-23 ENCOUNTER — Encounter (HOSPITAL_COMMUNITY): Admission: RE | Payer: Self-pay | Source: Ambulatory Visit

## 2017-03-23 ENCOUNTER — Encounter (INDEPENDENT_AMBULATORY_CARE_PROVIDER_SITE_OTHER): Payer: Self-pay | Admitting: *Deleted

## 2017-03-23 ENCOUNTER — Telehealth (INDEPENDENT_AMBULATORY_CARE_PROVIDER_SITE_OTHER): Payer: Self-pay | Admitting: *Deleted

## 2017-03-23 SURGERY — COLONOSCOPY
Anesthesia: Moderate Sedation

## 2017-03-23 NOTE — Telephone Encounter (Signed)
Patient didn't show for TCS today, this is after nurses in ENDO spoke to him to remind him and check on how prep was going

## 2017-03-27 NOTE — Telephone Encounter (Signed)
noted 

## 2017-04-25 ENCOUNTER — Encounter (INDEPENDENT_AMBULATORY_CARE_PROVIDER_SITE_OTHER): Payer: Self-pay | Admitting: *Deleted

## 2017-04-26 ENCOUNTER — Other Ambulatory Visit (INDEPENDENT_AMBULATORY_CARE_PROVIDER_SITE_OTHER): Payer: Self-pay | Admitting: *Deleted

## 2017-04-26 ENCOUNTER — Encounter (INDEPENDENT_AMBULATORY_CARE_PROVIDER_SITE_OTHER): Payer: Self-pay | Admitting: *Deleted

## 2017-04-26 DIAGNOSIS — R109 Unspecified abdominal pain: Secondary | ICD-10-CM

## 2017-05-30 ENCOUNTER — Telehealth (INDEPENDENT_AMBULATORY_CARE_PROVIDER_SITE_OTHER): Payer: Self-pay | Admitting: *Deleted

## 2017-05-30 MED ORDER — PEG 3350-KCL-NA BICARB-NACL 420 G PO SOLR: 4000.0000 mL | Freq: Once | ORAL | 0 refills | Status: AC

## 2017-05-30 NOTE — Telephone Encounter (Signed)
Patient needs trilyte 

## 2017-06-07 ENCOUNTER — Encounter (HOSPITAL_COMMUNITY): Payer: Self-pay | Admitting: *Deleted

## 2017-06-07 ENCOUNTER — Encounter (HOSPITAL_COMMUNITY)
Admission: RE | Disposition: A | Discharge status: Home / Self Care | Payer: Self-pay | Source: Ambulatory Visit | Attending: Internal Medicine

## 2017-06-07 ENCOUNTER — Other Ambulatory Visit: Payer: Self-pay

## 2017-06-07 ENCOUNTER — Ambulatory Visit (HOSPITAL_COMMUNITY)
Admission: RE | Admit: 2017-06-07 | Discharge: 2017-06-07 | Disposition: A | Discharge status: Home / Self Care | Payer: Medicare Other | Source: Ambulatory Visit | Attending: Internal Medicine | Admitting: Internal Medicine

## 2017-06-07 DIAGNOSIS — Z7982 Long term (current) use of aspirin: Secondary | ICD-10-CM | POA: Diagnosis not present

## 2017-06-07 DIAGNOSIS — R109 Unspecified abdominal pain: Secondary | ICD-10-CM | POA: Insufficient documentation

## 2017-06-07 DIAGNOSIS — F329 Major depressive disorder, single episode, unspecified: Secondary | ICD-10-CM | POA: Insufficient documentation

## 2017-06-07 DIAGNOSIS — Z8673 Personal history of transient ischemic attack (TIA), and cerebral infarction without residual deficits: Secondary | ICD-10-CM | POA: Diagnosis not present

## 2017-06-07 DIAGNOSIS — K644 Residual hemorrhoidal skin tags: Secondary | ICD-10-CM | POA: Insufficient documentation

## 2017-06-07 DIAGNOSIS — Z800 Family history of malignant neoplasm of digestive organs: Secondary | ICD-10-CM | POA: Diagnosis not present

## 2017-06-07 DIAGNOSIS — E7800 Pure hypercholesterolemia, unspecified: Secondary | ICD-10-CM | POA: Diagnosis not present

## 2017-06-07 DIAGNOSIS — I10 Essential (primary) hypertension: Secondary | ICD-10-CM | POA: Diagnosis not present

## 2017-06-07 DIAGNOSIS — M1990 Unspecified osteoarthritis, unspecified site: Secondary | ICD-10-CM | POA: Diagnosis not present

## 2017-06-07 DIAGNOSIS — Z1211 Encounter for screening for malignant neoplasm of colon: Secondary | ICD-10-CM | POA: Insufficient documentation

## 2017-06-07 DIAGNOSIS — Z87891 Personal history of nicotine dependence: Secondary | ICD-10-CM | POA: Insufficient documentation

## 2017-06-07 DIAGNOSIS — Z79899 Other long term (current) drug therapy: Secondary | ICD-10-CM | POA: Diagnosis not present

## 2017-06-07 DIAGNOSIS — G8929 Other chronic pain: Secondary | ICD-10-CM | POA: Insufficient documentation

## 2017-06-07 DIAGNOSIS — Z8249 Family history of ischemic heart disease and other diseases of the circulatory system: Secondary | ICD-10-CM | POA: Insufficient documentation

## 2017-06-07 DIAGNOSIS — I739 Peripheral vascular disease, unspecified: Secondary | ICD-10-CM | POA: Insufficient documentation

## 2017-06-07 DIAGNOSIS — K5730 Diverticulosis of large intestine without perforation or abscess without bleeding: Secondary | ICD-10-CM | POA: Diagnosis not present

## 2017-06-07 DIAGNOSIS — Z7902 Long term (current) use of antithrombotics/antiplatelets: Secondary | ICD-10-CM | POA: Diagnosis not present

## 2017-06-07 DIAGNOSIS — K648 Other hemorrhoids: Secondary | ICD-10-CM | POA: Insufficient documentation

## 2017-06-07 HISTORY — PX: COLONOSCOPY: SHX5424

## 2017-06-07 SURGERY — COLONOSCOPY
Anesthesia: Moderate Sedation

## 2017-06-07 MED ORDER — SODIUM CHLORIDE 0.9 % IV SOLN
INTRAVENOUS | Status: DC
  Administered 2017-06-07: 12:00:00 via INTRAVENOUS

## 2017-06-07 MED ORDER — MIDAZOLAM HCL 5 MG/5ML IJ SOLN
INTRAMUSCULAR | Status: DC | PRN
  Administered 2017-06-07 (×2): 2 mg via INTRAVENOUS
  Administered 2017-06-07: 1 mg via INTRAVENOUS

## 2017-06-07 MED ORDER — MIDAZOLAM HCL 5 MG/5ML IJ SOLN
INTRAMUSCULAR | Status: AC
  Filled 2017-06-07: qty 10

## 2017-06-07 MED ORDER — STERILE WATER FOR IRRIGATION IR SOLN
Status: DC | PRN
  Administered 2017-06-07: 12:00:00

## 2017-06-07 MED ORDER — DICYCLOMINE HCL 10 MG PO CAPS: 10.0000 mg | ORAL_CAPSULE | Freq: Three times a day (TID) | ORAL | 0 refills | Status: DC | PRN

## 2017-06-07 MED ORDER — MEPERIDINE HCL 50 MG/ML IJ SOLN
INTRAMUSCULAR | Status: AC
  Filled 2017-06-07: qty 1

## 2017-06-07 MED ORDER — MEPERIDINE HCL 50 MG/ML IJ SOLN
INTRAMUSCULAR | Status: DC | PRN
  Administered 2017-06-07 (×2): 25 mg

## 2017-06-07 NOTE — Op Note (Signed)
[facility] Patient Name: Cameron Ware Procedure Date: 06/07/2017 11:26 AM MRN: [id number] Date of Birth: 11/13/1960 Attending MD: Lionel December , MD CSN: [id number] Age: 57 Admit Type: Outpatient Procedure:                Colonoscopy Indications:              Screening for colorectal malignant neoplasm; father                            with CRC at late onset. Providers:                Lionel December, MD, Buel Ream Thomasena Edis RN, RN, Jannett Celestine, RN Referring MD:             Joette Catching, MD Medicines:                Meperidine 50 mg IV, Midazolam 5 mg IV Complications:            No immediate complications. Estimated Blood Loss:     Estimated blood loss: none. Procedure:                Pre-Anesthesia Assessment:                           - Prior to the procedure, a History and Physical                            was performed, and patient medications and                            allergies were reviewed. The patient's tolerance of                            previous anesthesia was also reviewed. The risks                            and benefits of the procedure and the sedation                            options and risks were discussed with the patient.                            All questions were answered, and informed consent                            was obtained. Prior Anticoagulants: The patient                            last took aspirin 5 days and Plavix (clopidogrel) 5                            days prior to the procedure. ASA Grade Assessment:  III - A patient with severe systemic disease. After                            reviewing the risks and benefits, the patient was                            deemed in satisfactory condition to undergo the                            procedure.                           After obtaining informed consent, the colonoscope                            was passed under  direct vision. Throughout the                            procedure, the patient's blood pressure, pulse, and                            oxygen saturations were monitored continuously. The                            [id number] ([id number]) was introduced through the                            anus and advanced to the the cecum, identified by                            appendiceal orifice and ileocecal valve. The                            colonoscopy was performed without difficulty. The                            patient tolerated the procedure well. The quality                            of the bowel preparation was good. The ileocecal                            valve, appendiceal orifice, and rectum were                            photographed. Scope In: 12:20:02 PM Scope Out: 12:44:33 PM Scope Withdrawal Time: 0 hours 8 minutes 33 seconds  Total Procedure Duration: 0 hours 24 minutes 31 seconds  Findings:      The perianal and digital rectal examinations were normal.      A few medium-mouthed diverticula were found in the sigmoid colon.      The exam was otherwise normal throughout the examined colon.      External and internal hemorrhoids were found during retroflexion. The       hemorrhoids were small. Impression:               -  Diverticulosis in the sigmoid colon.                           - External and internal hemorrhoids.                           - No specimens collected. Moderate Sedation:      Moderate (conscious) sedation was administered by the endoscopy nurse       and supervised by the endoscopist. The following parameters were       monitored: oxygen saturation, heart rate, blood pressure, CO2       capnography and response to care. Total physician intraservice time was       29 minutes. Recommendation:           - Patient has a contact number available for                            emergencies. The signs and symptoms of potential                            delayed  complications were discussed with the                            patient. Return to normal activities tomorrow.                            Written discharge instructions were provided to the                            patient.                           - High fiber diet today.                           - Continue present medications.                           - Resume aspirin today and Plavix (clopidogrel)                            today at prior doses.                           - Dicyclomine 10 mg po tid prn.                           - Repeat colonoscopy in 10 years for screening                            purposes. Procedure Code(s):        --- Professional ---                           [id number], Colonoscopy, flexible; diagnostic, including  collection of specimen(s) by brushing or washing,                            when performed (separate procedure)                           99152, Moderate sedation services provided by the                            same physician or other qualified health care                            professional performing the diagnostic or                            therapeutic service that the sedation supports,                            requiring the presence of an independent trained                            observer to assist in the monitoring of the                            patient's level of consciousness and physiological                            status; initial 15 minutes of intraservice time,                            patient age 66 years or older                           [phone], Moderate sedation services; each additional                            15 minutes intraservice time Diagnosis Code(s):        --- Professional ---                           Z12.11, Encounter for screening for malignant                            neoplasm of colon                           K64.8, Other hemorrhoids                            K57.30, Diverticulosis of large intestine without                            perforation or abscess without bleeding CPT copyright 2016 American Medical Association. All rights reserved. The codes documented in this report are preliminary and upon coder review may  be revised to meet current compliance requirements.  Lionel DecemberNajeeb Travarus Trudo, MD Lionel DecemberNajeeb Emiliana Blaize, MD 06/07/2017 12:56:56 PM This report has been signed electronically. Number of Addenda: 0

## 2017-06-07 NOTE — Discharge Instructions (Signed)
Resume usual medications including aspirin and clopidogrel. Dicyclomine 10 mg by mouth 3 times a day as needed for lower abdominal pain. High-fiber diet. No driving for 24 hours. Next colonoscopy in 10 years.   Colonoscopy, Adult, Care After This sheet gives you information about how to care for yourself after your procedure. Your health care provider may also give you more specific instructions. If you have problems or questions, contact your health care provider. Dr Karilyn Cota: [phone].  After hours call the hospital and ask for the GI doctor on call.  That doctor will call you back. What can I expect after the procedure? After the procedure, it is common to have:  Some gas.  Mild abdominal cramping or bloating.  Follow these instructions at home: General instructions   For the first 24 hours after the procedure: ? Do not drive or use machinery. ? Do not sign important documents. ? Do not drink alcohol. ? Do your  activities at a slower pace than normal. ? Rest often.  Take over-the-counter or prescription medicines only as told by your health care provider.  It is up to you to get the results of your procedure. Ask your health care provider, or the department performing the procedure, when your results will be ready. Relieving cramping and bloating  Try walking around when you have cramps or feel bloated. Eating and drinking  Drink enough fluid to keep your urine clear or pale yellow.  Resume your normal diet as instructed by your health care provider. Avoid heavy or fried foods that are hard to digest.  Avoid drinking alcohol for 24 hours Contact a health care provider if:  You have blood in your stool 2-3 days after the procedure. Get help right away if:  You have more than a small spotting of blood in your stool.  You pass large blood clots in your stool.  Your abdomen is swollen.  You have nausea or vomiting.  You have a fever.  You have increasing  abdominal pain that is not relieved with medicine. This information is not intended to replace advice given to you by your health care provider. Make sure you discuss any questions you have with your health care provider. Document Released: 12/28/2003 Document Revised: 02/07/2016 Document Reviewed: 07/27/2015 Elsevier Interactive Patient Education  2018 Elsevier Inc.   High-Fiber Diet Fiber, also called dietary fiber, is a type of carbohydrate found in fruits, vegetables, whole grains, and beans. A high-fiber diet can have many health benefits. Your health care provider may recommend a high-fiber diet to help:  Prevent constipation. Fiber can make your bowel movements more regular.  Lower your cholesterol.  Relieve hemorrhoids, uncomplicated diverticulosis, or irritable bowel syndrome.  Prevent overeating as part of a weight-loss plan.  Prevent heart disease, type 2 diabetes, and certain cancers.  What is my plan? The recommended daily intake of fiber includes:  38 grams for men under age 50.  30 grams for men over age 19.  25 grams for women under age 71.  21 grams for women over age 11.  You can get the recommended daily intake of dietary fiber by eating a variety of fruits, vegetables, grains, and beans. Your health care provider may also recommend a fiber supplement if it is not possible to get enough fiber through your diet. What do I need to know about a high-fiber diet?  Fiber supplements have not been widely studied for their effectiveness, so it is better to get fiber through food sources.  Always check the fiber content on thenutrition facts label of any prepackaged food. Look for foods that contain at least 5 grams of fiber per serving.  Ask your dietitian if you have questions about specific foods that are related to your condition, especially if those foods are not listed in the following section.  Increase your daily fiber consumption gradually. Increasing your  intake of dietary fiber too quickly may cause bloating, cramping, or gas.  Drink plenty of water. Water helps you to digest fiber. What foods can I eat? Grains Whole-grain breads. Multigrain cereal. Oats and oatmeal. Brown rice. Barley. Bulgur wheat. Millet. Bran muffins. Popcorn. Rye wafer crackers. Vegetables Sweet potatoes. Spinach. Kale. Artichokes. Cabbage. Broccoli. Green peas. Carrots. Squash. Fruits Berries. Pears. Apples. Oranges. Avocados. Prunes and raisins. Dried figs. Meats and Other Protein Sources Navy, kidney, pinto, and soy beans. Split peas. Lentils. Nuts and seeds. Dairy Fiber-fortified yogurt. Beverages Fiber-fortified soy milk. Fiber-fortified orange juice. Other Fiber bars. The items listed above may not be a complete list of recommended foods or beverages. Contact your dietitian for more options. What foods are not recommended? Grains White bread. Pasta made with refined flour. White rice. Vegetables Fried potatoes. Canned vegetables. Well-cooked vegetables. Fruits Fruit juice. Cooked, strained fruit. Meats and Other Protein Sources Fatty cuts of meat. Fried Environmental education officerpoultry or fried fish. Dairy Milk. Yogurt. Cream cheese. Sour cream. Beverages Soft drinks. Other Cakes and pastries. Butter and oils. The items listed above may not be a complete list of foods and beverages to avoid. Contact your dietitian for more information. What are some tips for including high-fiber foods in my diet?  Eat a wide variety of high-fiber foods.  Make sure that half of all grains consumed each day are whole grains.  Replace breads and cereals made from refined flour or white flour with whole-grain breads and cereals.  Replace white rice with brown rice, bulgur wheat, or millet.  Start the day with a breakfast that is high in fiber, such as a cereal that contains at least 5 grams of fiber per serving.  Use beans in place of meat in soups, salads, or pasta.  Eat high-fiber  snacks, such as berries, raw vegetables, nuts, or popcorn. This information is not intended to replace advice given to you by your health care provider. Make sure you discuss any questions you have with your health care provider. Document Released: 05/15/2005 Document Revised: 10/21/2015 Document Reviewed: 10/28/2013 Elsevier Interactive Patient Education  [employer]2018 Elsevier Inc.

## 2017-06-07 NOTE — H&P (Addendum)
Cameron Ware is an 57 y.o. male.   Chief Complaint: Patient is here for colonoscopy. HPI: Patient is 57 year old Caucasian male who is here for screening colonoscopy.  His father was diagnosed with colon carcinoma in his late 55s and patient states he should have had exam over 10 years ago.  His father is living at 2.  He denies rectal bleeding or melena.  He has occasional diarrhea with 1-2 loose stools per day.  On most days he had formed stool.  He has never been constipated.  He complains of pain across lower abdomen.  It is more on the left than the right.  He describes his pain to be squeezing pain and he has had this pain for at least 5 years.  This pain is not made worse with meals.  He states he has not lost any weight recently.-He has gained a few pounds.  He quit cigarette smoking 5 years ago when he had stenting to both lower extremities for arterial disease. He had CT angiogram abdomen and pelvis in June 2018 revealing narrowing to celiac trunk and SMA.  He has been evaluated by his vascular surgeon and his pain is felt to be nonischemic. Patient has been off aspirin and plavix for 5 days.  Past Medical History:  Diagnosis Date  . Anorexia 11/29/2010  . Arthritis   . Chest pain   . Decreased vision   . Depression   . Dyspnea on exertion   . Generalized headaches   . High cholesterol 01/30/2017  . Hyperlipidemia   . Hypertension   . Peripheral vascular disease (HCC)   . Stroke (HCC)   . Urinary frequency     Past Surgical History:  Procedure Laterality Date  . ABDOMINAL AORTAGRAM N/A 08/22/2011   Procedure: ABDOMINAL Ronny Flurry;  Surgeon: Nada Libman, MD;  Location[facility] CATH LAB;  Service: Cardiovascular;  Laterality: N/A;  . aortogram    . PERCUTANEOUS STENT INTERVENTION Bilateral 08/22/2011   Procedure: PERCUTANEOUS STENT INTERVENTION;  Surgeon: Nada Libman, MD;  Location: [facility] CATH LAB;  Service: Cardiovascular;  Laterality: Bilateral;  . Stents  August 22, 2011    Bilateral iliac PTA and stenting    Family History  Problem Relation Age of Onset  . Heart disease Mother   . Diabetes Father   . Colon cancer Father    Social History:  reports that he quit smoking about 4 years ago. His smoking use included cigarettes. He quit after 40.00 years of use. he has never used smokeless tobacco. He reports that he drinks alcohol. He reports that he does not use drugs.  Allergies: No Known Allergies  Medications Prior to Admission  Medication Sig Dispense Refill  . aspirin EC 325 MG tablet Take 325 mg by mouth daily.    Marland Kitchen atorvastatin (LIPITOR) 80 MG tablet Take 80 mg by mouth daily at 6 PM.     . cilostazol (PLETAL) 100 MG tablet Take 100 mg by mouth 2 (two) times daily.    . clopidogrel (PLAVIX) 75 MG tablet Take 1 tablet (75 mg total) by mouth daily. 30 tablet 5  . meloxicam (MOBIC) 7.5 MG tablet Take 7.5 mg by mouth daily.    . pantoprazole (PROTONIX) 40 MG tablet Take 40 mg by mouth daily.    . potassium chloride (K-DUR) 10 MEQ tablet Take 20 mEq by mouth 2 (two) times daily.       No results found for this or any previous visit (from the  past 48 hour(s)). No results found.  ROS  Blood pressure (!) 142/73, pulse (!) 59, temperature 97.9 F (36.6 C), temperature source Oral, resp. rate 13, height 5\' 5"  (1.651 m), weight 144 lb (65.3 kg), SpO2 100 %. Physical Exam  Constitutional: He appears well-developed and well-nourished.  HENT:  Mouth/Throat: Oropharynx is clear and moist.  Eyes: Conjunctivae are normal.  Neck: No thyromegaly present.  Cardiovascular: Normal rate, regular rhythm and normal heart sounds.  No murmur heard. Respiratory: Effort normal and breath sounds normal.  GI:  Abdomen is full.  No bruit noted.  On palpation it is soft.  He has mild tenderness just above suprapubic region without any guarding.  Slightly more tender on the left side that on the right side.  No inguinal hernia noted.  No organomegaly or masses.   Musculoskeletal: He exhibits no edema.  Lymphadenopathy:    He has no cervical adenopathy.  Neurological: He is alert.  Skin: Skin is warm and dry.     Assessment/Plan Average risk screening colonoscopy. History of CRC in first-degree relative at late onset. Chronic abdominal pain.   Lionel DecemberNajeeb Fischer Halley, MD 06/07/2017, 12:09 PM

## 2017-06-08 ENCOUNTER — Ambulatory Visit: Payer: Medicare Other | Admitting: Vascular Surgery

## 2017-06-08 ENCOUNTER — Ambulatory Visit (HOSPITAL_COMMUNITY)
Admission: RE | Admit: 2017-06-08 | Discharge: 2017-06-08 | Disposition: A | Discharge status: Home / Self Care | Payer: Medicare Other | Source: Ambulatory Visit | Attending: Vascular Surgery | Admitting: Vascular Surgery

## 2017-06-08 ENCOUNTER — Ambulatory Visit (INDEPENDENT_AMBULATORY_CARE_PROVIDER_SITE_OTHER)
Admission: RE | Admit: 2017-06-08 | Discharge: 2017-06-08 | Disposition: A | Discharge status: Home / Self Care | Payer: Medicare Other | Source: Ambulatory Visit | Attending: Vascular Surgery | Admitting: Vascular Surgery

## 2017-06-08 DIAGNOSIS — I739 Peripheral vascular disease, unspecified: Secondary | ICD-10-CM | POA: Insufficient documentation

## 2017-06-08 DIAGNOSIS — I771 Stricture of artery: Secondary | ICD-10-CM | POA: Diagnosis not present

## 2017-06-08 DIAGNOSIS — R1030 Lower abdominal pain, unspecified: Secondary | ICD-10-CM | POA: Insufficient documentation

## 2017-06-11 ENCOUNTER — Encounter (HOSPITAL_COMMUNITY): Payer: Self-pay | Admitting: Internal Medicine

## 2017-06-29 ENCOUNTER — Encounter: Payer: Self-pay | Admitting: Vascular Surgery

## 2017-06-29 ENCOUNTER — Ambulatory Visit (INDEPENDENT_AMBULATORY_CARE_PROVIDER_SITE_OTHER): Payer: Medicare Other | Admitting: Vascular Surgery

## 2017-06-29 VITALS — BP 138/79 | HR 76 | Resp 20 | Ht 65.0 in | Wt 142.0 lb

## 2017-06-29 DIAGNOSIS — R1030 Lower abdominal pain, unspecified: Secondary | ICD-10-CM

## 2017-06-29 DIAGNOSIS — I771 Stricture of artery: Secondary | ICD-10-CM | POA: Diagnosis not present

## 2017-06-29 NOTE — Progress Notes (Signed)
[name], male   DOB: 12/22/1960, 57 y.o.   MRN: [id number]  Reason for Consult: Follow-up (6 month f/u - c/o abdominal pain )   Referred by Joette Catching, MD  Subjective:     HPI:  Cameron Ware is a 57 y.o. male with history of bilateral common iliac artery stents.  He is formerly a heavy smoker as well as drinker but has essentially quit smoking only occasionally smoking 1 cigarette at a time.  Last visit he was sent for evaluation of mesenteric ischemia given that he was found to have stenosis of both the celiac and SMA arteries and was having lower abdominal pain.  He has subsequently undergone colonoscopy which was negative.  His pain is in the very lower abdomen was worse with activity.  Since our last visit he states that he is actually gained some weight does not have any problems eating specifically postprandial pain or food fear.  He can currently walk about 400 feet and states that this is enough for him.  He does not have rest pain or tissue loss.  He does have a history of a stroke in the remote past.  He is really not having any new issues related to today's visit.  Past Medical History:  Diagnosis Date  . Anorexia 11/29/2010  . Arthritis   . Chest pain   . Decreased vision   . Depression   . Dyspnea on exertion   . Generalized headaches   . High cholesterol 01/30/2017  . Hyperlipidemia   . Hypertension   . Peripheral vascular disease (HCC)   . Stroke (HCC)   . Urinary frequency    Family History  Problem Relation Age of Onset  . Heart disease Mother   . Diabetes Father   . Colon cancer Father    Past Surgical History:  Procedure Laterality Date  . ABDOMINAL AORTAGRAM N/A 08/22/2011   Procedure: ABDOMINAL Ronny Flurry;  Surgeon: Nada Libman, MD;  Location: [facility] CATH LAB;  Service: Cardiovascular;  Laterality: N/A;  . aortogram    . COLONOSCOPY N/A 06/07/2017   Procedure: COLONOSCOPY;  Surgeon: Malissa Hippo, MD;  Location: [facility]  ENDO SUITE;  Service: Endoscopy;  Laterality: N/A;  150  . PERCUTANEOUS STENT INTERVENTION Bilateral 08/22/2011   Procedure: PERCUTANEOUS STENT INTERVENTION;  Surgeon: Nada Libman, MD;  Location: [facility] CATH LAB;  Service: Cardiovascular;  Laterality: Bilateral;  . Stents  August 22, 2011   Bilateral iliac PTA and stenting    Short Social History:  Social History   Tobacco Use  . Smoking status: Former Smoker    Years: 40.00    Types: Cigarettes    Last attempt to quit: 08/04/2012    Years since quitting: 4.9  . Smokeless tobacco: Never Used  Substance Use Topics  . Alcohol use: Yes    Comment: OCCASIONALLY    No Known Allergies  Current Outpatient Medications  Medication Sig Dispense Refill  . aspirin EC 325 MG tablet Take 325 mg by mouth daily.    Marland Kitchen atorvastatin (LIPITOR) 80 MG tablet Take 80 mg by mouth daily at 6 PM.     . cilostazol (PLETAL) 100 MG tablet Take 100 mg by mouth 2 (two) times daily.    . clopidogrel (PLAVIX) 75 MG tablet Take 1 tablet (75 mg total) by mouth daily. 30 tablet 5  . dicyclomine (BENTYL) 10 MG capsule Take 1 capsule (10 mg total) by mouth 3 (three) times daily as  needed for spasms. 90 capsule 0  . meloxicam (MOBIC) 7.5 MG tablet Take 7.5 mg by mouth daily.    . pantoprazole (PROTONIX) 40 MG tablet Take 40 mg by mouth daily.    . potassium chloride (K-DUR) 10 MEQ tablet Take 20 mEq by mouth 2 (two) times daily.      No current facility-administered medications for this visit.     Review of Systems  Constitutional:  Constitutional negative. HENT: HENT negative.  Eyes: Eyes negative.  Respiratory: Respiratory negative.  Cardiovascular: Positive for claudication.  GI: Positive for abdominal pain.  Musculoskeletal: Musculoskeletal negative.  Skin: Skin negative.  Neurological: Positive for focal weakness.  Hematologic: Hematologic/lymphatic negative.  Psychiatric: Psychiatric negative.        Objective:  Objective   Vitals:   06/29/17  1015  BP: 138/79  Pulse: 76  Resp: 20  SpO2: 98%  Weight: 142 lb (64.4 kg)  Height: 5\' 5"  (1.651 m)   Body mass index is 23.63 kg/m.  Physical Exam  Constitutional: He is oriented to person, place, and time. He appears well-developed.  HENT:  Head: Normocephalic.  Eyes: Pupils are equal, round, and reactive to light.  Neck: Normal range of motion.  Cardiovascular:  Pulses:      Femoral pulses are 2+ on the right side, and 2+ on the left side. Pulmonary/Chest: Effort normal.  Abdominal: Soft. He exhibits no mass.  Musculoskeletal: Normal range of motion. He exhibits no edema.  Neurological: He is alert and oriented to person, place, and time.  Skin: Skin is warm and dry.  Psychiatric: He has a normal mood and affect. His behavior is normal. Judgment and thought content normal.    Data: Studies were reviewed by me a few weeks back which demonstrates ABIs of 0.88 on the right and 0.90 on the left.  His mesenteric artery exam demonstrates a velocity in the celiac artery 184 less than 70% stenosis in the SMA cannot be well visualized.     Assessment/Plan:     57 year old male follows up for mesenteric artery stenosis as well as previous bilateral common iliac artery stenting.  His pain in his abdomen is not really consistent with mesenteric ischemia and so I think we can stop evaluating his mesenteric vessels given that he has gained some weight is not having any postprandial pain or food fear.  He does have common iliac artery stents in his claudication but this time can walk as far as he wants.  We will follow him up in another year with aortoiliac duplex to evaluate the stent as well as ABIs.  Should he have issues before then we can certainly see him sooner.     Maeola HarmanBrandon Christopher Ellin Fitzgibbons MD [facility]

## 2017-07-05 ENCOUNTER — Other Ambulatory Visit (INDEPENDENT_AMBULATORY_CARE_PROVIDER_SITE_OTHER): Payer: Self-pay | Admitting: Internal Medicine

## 2017-10-31 ENCOUNTER — Other Ambulatory Visit (INDEPENDENT_AMBULATORY_CARE_PROVIDER_SITE_OTHER): Payer: Self-pay | Admitting: Internal Medicine

## 2017-12-04 ENCOUNTER — Emergency Department (HOSPITAL_COMMUNITY): Payer: Medicare Other

## 2017-12-04 ENCOUNTER — Emergency Department (HOSPITAL_COMMUNITY)
Admission: EM | Admit: 2017-12-04 | Discharge: 2017-12-04 | Disposition: A | Discharge status: Home / Self Care | Payer: Medicare Other | Attending: Emergency Medicine | Admitting: Emergency Medicine

## 2017-12-04 ENCOUNTER — Other Ambulatory Visit: Payer: Self-pay

## 2017-12-04 DIAGNOSIS — Y939 Activity, unspecified: Secondary | ICD-10-CM | POA: Diagnosis not present

## 2017-12-04 DIAGNOSIS — F10220 Alcohol dependence with intoxication, uncomplicated: Secondary | ICD-10-CM | POA: Insufficient documentation

## 2017-12-04 DIAGNOSIS — Y999 Unspecified external cause status: Secondary | ICD-10-CM | POA: Diagnosis not present

## 2017-12-04 DIAGNOSIS — Y929 Unspecified place or not applicable: Secondary | ICD-10-CM | POA: Diagnosis not present

## 2017-12-04 DIAGNOSIS — Z7982 Long term (current) use of aspirin: Secondary | ICD-10-CM | POA: Diagnosis not present

## 2017-12-04 DIAGNOSIS — Z79899 Other long term (current) drug therapy: Secondary | ICD-10-CM | POA: Insufficient documentation

## 2017-12-04 DIAGNOSIS — Z87891 Personal history of nicotine dependence: Secondary | ICD-10-CM | POA: Diagnosis not present

## 2017-12-04 DIAGNOSIS — Z8673 Personal history of transient ischemic attack (TIA), and cerebral infarction without residual deficits: Secondary | ICD-10-CM | POA: Diagnosis not present

## 2017-12-04 DIAGNOSIS — Z7901 Long term (current) use of anticoagulants: Secondary | ICD-10-CM | POA: Insufficient documentation

## 2017-12-04 DIAGNOSIS — S0990XA Unspecified injury of head, initial encounter: Secondary | ICD-10-CM | POA: Diagnosis not present

## 2017-12-04 DIAGNOSIS — I739 Peripheral vascular disease, unspecified: Secondary | ICD-10-CM

## 2017-12-04 DIAGNOSIS — R109 Unspecified abdominal pain: Secondary | ICD-10-CM | POA: Diagnosis present

## 2017-12-04 DIAGNOSIS — F10920 Alcohol use, unspecified with intoxication, uncomplicated: Secondary | ICD-10-CM

## 2017-12-04 LAB — CBC WITH DIFFERENTIAL/PLATELET
ABS IMMATURE GRANULOCYTES: 0 10*3/uL (ref 0.0–0.1)
BASOS ABS: 0.1 10*3/uL (ref 0.0–0.1)
Basophils Relative: 1 %
Eosinophils Absolute: 0 10*3/uL (ref 0.0–0.7)
Eosinophils Relative: 0 %
HCT: 44.8 % (ref 39.0–52.0)
Hemoglobin: 15.3 g/dL (ref 13.0–17.0)
Immature Granulocytes: 0 %
LYMPHS ABS: 1.4 10*3/uL (ref 0.7–4.0)
Lymphocytes Relative: 17 %
MCH: 30.7 pg (ref 26.0–34.0)
MCHC: 34.2 g/dL (ref 30.0–36.0)
MCV: 89.8 fL (ref 78.0–100.0)
MONO ABS: 0.5 10*3/uL (ref 0.1–1.0)
MONOS PCT: 6 %
NEUTROS ABS: 6 10*3/uL (ref 1.7–7.7)
Neutrophils Relative %: 76 %
PLATELETS: 267 10*3/uL (ref 150–400)
RBC: 4.99 MIL/uL (ref 4.22–5.81)
RDW: 12.2 % (ref 11.5–15.5)
WBC: 7.9 10*3/uL (ref 4.0–10.5)

## 2017-12-04 LAB — COMPREHENSIVE METABOLIC PANEL
ALBUMIN: 4.3 g/dL (ref 3.5–5.0)
ALT: 16 U/L (ref 0–44)
ANION GAP: 15 (ref 5–15)
AST: 22 U/L (ref 15–41)
Alkaline Phosphatase: 74 U/L (ref 38–126)
BUN: 5 mg/dL — AB (ref 6–20)
CHLORIDE: 103 mmol/L (ref 98–111)
CO2: 22 mmol/L (ref 22–32)
Calcium: 8.9 mg/dL (ref 8.9–10.3)
Creatinine, Ser: 0.96 mg/dL (ref 0.61–1.24)
GFR calc Af Amer: >60 mL/min (ref >60)
GFR calc non Af Amer: >60 mL/min (ref >60)
GLUCOSE: 95 mg/dL (ref 70–99)
POTASSIUM: 3.5 mmol/L (ref 3.5–5.1)
SODIUM: 140 mmol/L (ref 135–145)
Total Bilirubin: 0.6 mg/dL (ref 0.3–1.2)
Total Protein: 7.2 g/dL (ref 6.5–8.1)

## 2017-12-04 LAB — ETHANOL: ALCOHOL ETHYL (B): 311 mg/dL — AB (ref <10)

## 2017-12-04 MED ORDER — IOHEXOL 300 MG/ML  SOLN
100.0000 mL | Freq: Once | INTRAMUSCULAR | Status: AC | PRN
  Administered 2017-12-04: 100 mL via INTRAVENOUS

## 2017-12-04 NOTE — ED Provider Notes (Signed)
I received pt in signout from Dr. Rubin PayorPickering. We were awaiting imaging.  CT showed no acute injuries of head through pelvis.  Incidental findings of multiple areas of severe peripheral vascular disease including left arm and bilateral legs.  The patient has already established care with vein and vascular and has had previous procedures on his legs.  He denies any new or sudden changes in sensation of extremities or severe claudication symptoms.  I have recommended that he contact the clinic for a follow-up appointment.  I had a long discussion with the patient regarding the dangers of drinking and driving.  Reviewed return precautions.  He voiced understanding.  Awake, alert, and coherent on reassessment, ambulatory.  Discharged in satisfactory condition.   Cameron Ware, Cameron Finlandachel Morgan, MD 12/05/17 0030

## 2017-12-04 NOTE — ED Notes (Signed)
Blood draw done for [facility].

## 2017-12-04 NOTE — ED Notes (Signed)
Pt care assumed, obtained verbal report. Pt is resting and appears comfortable.  Waiting for CT results.
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
## 2017-12-04 NOTE — Discharge Instructions (Addendum)
Your CT scan showed no injuries but did show several areas of concerning peripheral vascular disease (narrowing of blood vessels to your left arm and both legs.)  Please follow-up with the vascular clinic for further examination.  Return to ER immediately if you have any numb, cold, or blue arms or legs.

## 2017-12-04 NOTE — ED Notes (Signed)
Pt transported back from CT.  
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
## 2017-12-04 NOTE — ED Notes (Signed)
Patient transported to CT 

## 2018-01-23 ENCOUNTER — Ambulatory Visit (INDEPENDENT_AMBULATORY_CARE_PROVIDER_SITE_OTHER): Payer: Medicare Other | Admitting: Internal Medicine

## 2018-01-23 ENCOUNTER — Encounter (INDEPENDENT_AMBULATORY_CARE_PROVIDER_SITE_OTHER): Payer: Self-pay | Admitting: Internal Medicine

## 2018-01-23 VITALS — BP 132/78 | HR 80 | Temp 97.8°F | Ht 65.0 in | Wt 143.0 lb

## 2018-01-23 DIAGNOSIS — R1030 Lower abdominal pain, unspecified: Secondary | ICD-10-CM

## 2018-01-23 MED ORDER — DICYCLOMINE HCL 10 MG PO CAPS: ORAL_CAPSULE | ORAL | 3 refills | Status: DC

## 2018-01-23 NOTE — Progress Notes (Signed)
Subjective:    [name], male    DOB: [birth year], [age] y.o.   MRN: [id number]  HPI Presents today with c/o lower abdominal pain. Feels like someone is squeezing this ara.  Has seen vascular (Dr. Randie Heinzain) for his abdominal pain. Has this pain for years. Last saw Dr. Randie Heinzain in February of this year.  His mesenteric artery exam demostrates a velocity if the celiac artery, 70% stenosis in the SMA cannot be well visualized. His pain was felt not to be ischemic. He has hx of this chronic lower abdominal pain for years. His appetite is okay.He has maintained his weight. He occasionally has GERD. He does not have pain after he eats. Has a BM daily. Has a BM daily.  Underwent a colonoscopy in January of this year.  Normal. Diverticulosis sigmoid colon. External and internal hemorrhoids.  Recent CT in July revealed 1. No evidence of acute traumatic injury to the abdomen or pelvis. 2. Non-obstructing 4 mm calculus in a mid calyx of the RIGHT kidney. 3. Distal descending and sigmoid colon diverticulosis without evidence of acute diverticulitis. 4. Possible hemodynamically significant stenoses of the common femoral arteries bilaterally. Does the patient have LOWER extremity claudication?  Aortic Atherosclerosis (ICD10-I70.0) and Emphysema (ICD10-J43.9).   Hx of peripheral arterial disease. Hx of iliac endarterectomy bypass surgery.    Review of Systems Past Medical History:  Diagnosis Date  . Anorexia 11/29/2010  . Arthritis   . Chest pain   . Decreased vision   . Depression   . Dyspnea on exertion   . Generalized headaches   . High cholesterol 01/30/2017  . Hyperlipidemia   . Hypertension   . Peripheral vascular disease (HCC)   . Stroke (HCC)   . Urinary frequency     Past Surgical History:  Procedure Laterality Date  . ABDOMINAL AORTAGRAM N/A 08/22/2011   Procedure: ABDOMINAL Ronny FlurryAORTAGRAM;  Surgeon: Nada LibmanVance W Brabham, MD;  [facility];  Service:  Cardiovascular;  Laterality: N/A;  . aortogram    . COLONOSCOPY N/A 06/07/2017   Procedure: COLONOSCOPY;  Surgeon: Malissa Hippoehman, Najeeb U, MD;  Location[facility];  Service: Endoscopy;  Laterality: N/A;  150  . PERCUTANEOUS STENT INTERVENTION Bilateral 08/22/2011   Procedure: PERCUTANEOUS STENT INTERVENTION;  Surgeon: Nada LibmanVance W Brabham, MD;  [facility];  Service: Cardiovascular;  Laterality: Bilateral;  . Stents  August 22, 2011   Bilateral iliac PTA and stenting    No Known Allergies  Current Outpatient Medications on File Prior to Visit  Medication Sig Dispense Refill  . aspirin EC 325 MG tablet Take 325 mg by mouth daily.    Marland Kitchen. atorvastatin (LIPITOR) 80 MG tablet Take 80 mg by mouth daily at 6 PM.     . cilostazol (PLETAL) 100 MG tablet Take 100 mg by mouth 2 (two) times daily.    . clopidogrel (PLAVIX) 75 MG tablet Take 1 tablet (75 mg total) by mouth daily. 30 tablet 5  . meloxicam (MOBIC) 7.5 MG tablet Take 7.5 mg by mouth daily.    . pantoprazole (PROTONIX) 40 MG tablet Take 40 mg by mouth daily.    . potassium chloride (K-DUR) 10 MEQ tablet Take 20 mEq by mouth 2 (two) times daily.     . [DISCONTINUED] pravastatin (PRAVACHOL) 40 MG tablet Take 40 mg by mouth daily.       No current facility-administered medications on file prior to visit.         Objective:  Physical Exam Blood pressure 132/78, pulse 80, temperature 97.8 F (36.6 C), height 5\' 5"  (1.651 m), weight 143 lb (64.9 kg). Alert and oriented. Skin warm and dry. Oral mucosa is moist.   . Sclera anicteric, conjunctivae is pink. Thyroid not enlarged. No cervical lymphadenopathy. Lungs clear. Heart regular rate and rhythm.  Abdomen is soft. Bowel sounds are positive. No hepatomegaly. No abdominal masses felt. No tenderness on palpation. No edema to lower extremities.               Assessment & Plan:  Lower abdominal pain. Recent coloscopy was normal. Has been seen by Vascular and they do not think the pain  is ischemic.  Take the Dicyclomine TID Follow up with Dr. Randie Heinz

## 2018-01-23 NOTE — Patient Instructions (Addendum)
Follow up with Dr Randie Heinzain

## 2018-07-01 ENCOUNTER — Other Ambulatory Visit: Payer: Self-pay

## 2018-07-01 DIAGNOSIS — I739 Peripheral vascular disease, unspecified: Secondary | ICD-10-CM

## 2018-07-01 DIAGNOSIS — I771 Stricture of artery: Secondary | ICD-10-CM

## 2018-07-01 DIAGNOSIS — Z95828 Presence of other vascular implants and grafts: Secondary | ICD-10-CM

## 2018-07-01 DIAGNOSIS — Z87891 Personal history of nicotine dependence: Secondary | ICD-10-CM

## 2018-07-05 ENCOUNTER — Ambulatory Visit (HOSPITAL_COMMUNITY)
Admission: RE | Admit: 2018-07-05 | Discharge: 2018-07-05 | Disposition: A | Discharge status: Home / Self Care | Payer: Medicare Other | Source: Ambulatory Visit | Attending: Vascular Surgery | Admitting: Vascular Surgery

## 2018-07-05 ENCOUNTER — Encounter: Payer: Self-pay | Admitting: Family

## 2018-07-05 ENCOUNTER — Ambulatory Visit (INDEPENDENT_AMBULATORY_CARE_PROVIDER_SITE_OTHER): Payer: Medicare Other | Admitting: Physician Assistant

## 2018-07-05 VITALS — BP 157/83 | HR 87 | Temp 97.6°F | Resp 18 | Ht 65.0 in

## 2018-07-05 DIAGNOSIS — I771 Stricture of artery: Secondary | ICD-10-CM

## 2018-07-05 DIAGNOSIS — I739 Peripheral vascular disease, unspecified: Secondary | ICD-10-CM | POA: Diagnosis not present

## 2018-07-05 DIAGNOSIS — Z87891 Personal history of nicotine dependence: Secondary | ICD-10-CM | POA: Diagnosis not present

## 2018-07-05 DIAGNOSIS — Z95828 Presence of other vascular implants and grafts: Secondary | ICD-10-CM | POA: Insufficient documentation

## 2018-07-05 NOTE — Progress Notes (Signed)
History of Present Illness:  Patient is a 58 y.o. year old male with history of bilateral common iliac artery stents.  He is formerly a heavy smoker as well as drinker but has essentially quit smoking only occasionally smoking 1 cigarette at a time.  Last visit he was sent for evaluation of mesenteric ischemia given that he was found to have stenosis of both the celiac and SMA arteries and was having lower abdominal pain.  He has subsequently undergone colonoscopy which was negative.  His pain in his abdomen is not really consistent with mesenteric ischemia and so I think we can stop evaluating his mesenteric vessels given that he has gained some weight is not having any postprandial pain or food fear.  He states he is now up form 130 lbs to 150 lbs and is pleased.    He is here today for follow of his common iliac artery stents.  He denies calf pain and non healing wounds on his feet.  He is able to walk any distance with a limiting factor of lower abdominal pain.  He take Plavix, aspirin and Lipitor daily.  Past Medical History:  Diagnosis Date  . Anorexia 11/29/2010  . Arthritis   . Chest pain   . Decreased vision   . Depression   . Dyspnea on exertion   . Generalized headaches   . High cholesterol 01/30/2017  . Hyperlipidemia   . Hypertension   . Peripheral vascular disease (HCC)   . Stroke (HCC)   . Urinary frequency     Past Surgical History:  Procedure Laterality Date  . ABDOMINAL AORTAGRAM N/A 08/22/2011   Procedure: ABDOMINAL Ronny FlurryAORTAGRAM;  Surgeon: Nada LibmanVance W Brabham, MD;  [facility];  Service: Cardiovascular;  Laterality: N/A;  . aortogram    . COLONOSCOPY N/A 06/07/2017   Procedure: COLONOSCOPY;  Surgeon: Malissa Hippoehman, Najeeb U, MD;  [facility];  Service: Endoscopy;  Laterality: N/A;  150  . PERCUTANEOUS STENT INTERVENTION Bilateral 08/22/2011   Procedure: PERCUTANEOUS STENT INTERVENTION;  Surgeon: Nada LibmanVance W Brabham, MD;  [facility];  Service:  Cardiovascular;  Laterality: Bilateral;  . Stents  August 22, 2011   Bilateral iliac PTA and stenting    ROS:   General:  No weight loss, Fever, chills  HEENT: No recent headaches, no nasal bleeding, no visual changes, no sore throat  Neurologic: No dizziness, blackouts, seizures. No recent symptoms of stroke or mini- stroke. No recent episodes of slurred speech, or temporary blindness.  Cardiac: No recent episodes of chest pain/pressure, no shortness of breath at rest.  No shortness of breath with exertion.  Denies history of atrial fibrillation or irregular heartbeat  Vascular: No history of rest pain in feet.  positive history of claudication.  No history of non-healing ulcer, No history of DVT   Pulmonary: No home oxygen, no productive cough, no hemoptysis,  No asthma or wheezing  Musculoskeletal:  [ ]  Arthritis, [ ]  Low back pain,  [ ]  Joint pain  Hematologic:No history of hypercoagulable state.  No history of easy bleeding.  No history of anemia  Gastrointestinal: No hematochezia or melena,  No gastroesophageal reflux, no trouble swallowing, positive lower abdominal pain  Urinary: [ ]  chronic Kidney disease, [ ]  on HD - [ ]  MWF or [ ]  TTHS, [ ]  Burning with urination, [ ]  Frequent urination, [ ]  Difficulty urinating;   Skin: No rashes  Psychological: No history of anxiety,  No history of  depression  Social History Social History   Tobacco Use  . Smoking status: Former Smoker    Years: 40.00    Types: Cigarettes    Last attempt to quit: 08/04/2012    Years since quitting: 5.9  . Smokeless tobacco: Never Used  . Tobacco comment: quit smoking 5 yrs ago.   Substance Use Topics  . Alcohol use: Yes    Comment: OCCASIONALLY  . Drug use: No    Family History Family History  Problem Relation Age of Onset  . Heart disease Mother   . Diabetes Father   . Colon cancer Father     Allergies  No Known Allergies   Current Outpatient Medications  Medication Sig  Dispense Refill  . aspirin EC 325 MG tablet Take 325 mg by mouth daily.    Marland Kitchen atorvastatin (LIPITOR) 80 MG tablet Take 80 mg by mouth daily at 6 PM.     . cilostazol (PLETAL) 100 MG tablet Take 100 mg by mouth 2 (two) times daily.    . clopidogrel (PLAVIX) 75 MG tablet Take 1 tablet (75 mg total) by mouth daily. 30 tablet 5  . dicyclomine (BENTYL) 10 MG capsule TAKE 1 CAPSULE THREE TIMES DAILY AS NEEDED FOR STOMACH SPASM 90 capsule 3  . meloxicam (MOBIC) 7.5 MG tablet Take 7.5 mg by mouth daily.    . pantoprazole (PROTONIX) 40 MG tablet Take 40 mg by mouth daily.    . potassium chloride (K-DUR) 10 MEQ tablet Take 20 mEq by mouth 2 (two) times daily.      No current facility-administered medications for this visit.     Physical Examination  Vitals:   07/05/18 1005  BP: (!) 157/83  Pulse: 87  Resp: 18  Temp: 97.6 F (36.4 C)  TempSrc: Oral  SpO2: 99%  Height: 5\' 5"  (1.651 m)    Body mass index is 23.8 kg/m.  General:  Alert and oriented, no acute distress HEENT: Normal Neck: No bruit or JVD Pulmonary: Clear to auscultation bilaterally Cardiac: Regular Rate and Rhythm without murmur Abdomen: Soft, non-tender, non-distended, no mass, no scars Skin: No rash Extremity Pulses:  2+ radial, brachial, femoral, dorsalis pedis pulses bilaterally Musculoskeletal: No deformity or edema  Neurologic: Upper and lower extremity motor 5/5 and symmetric  DATA:    Abdominal Aorta Findings: +-------------+-------+----------+----------+--------+--------+--------+ Location     AP (cm)Trans (cm)PSV (cm/s)WaveformThrombusComments +-------------+-------+----------+----------+--------+--------+--------+ Supraceliac                   108                                +-------------+-------+----------+----------+--------+--------+--------+ Distal                        93                                  +-------------+-------+----------+----------+--------+--------+--------+ RT CIA Mid                    138                                +-------------+-------+----------+----------+--------+--------+--------+ RT CIA Distal                 105                                +-------------+-------+----------+----------+--------+--------+--------+  RT EIA Prox                   105                                +-------------+-------+----------+----------+--------+--------+--------+ RT EIA Mid                    274                                +-------------+-------+----------+----------+--------+--------+--------+ RT EIA Distal                 215                                +-------------+-------+----------+----------+--------+--------+--------+ LT CIA Distal                 161                                +-------------+-------+----------+----------+--------+--------+--------+ LT EIA Prox                   292                                +-------------+-------+----------+----------+--------+--------+--------+ LT EIA Mid                    640                                +-------------+-------+----------+----------+--------+--------+--------+ LT EIA Distal                 323                                +-------------+-------+----------+----------+--------+--------+--------+  Proximal SMA peak systolic velocity 315 cm/sec.   Right Stent(s): +-------------------+--------+--------+--------+--------+ common iliac arteryPSV cm/sStenosisWaveformComments +-------------------+--------+--------+--------+--------+ Prox to Stent      75                               +-------------------+--------+--------+--------+--------+ Proximal Stent     61                               +-------------------+--------+--------+--------+--------+ Mid Stent          77                                +-------------------+--------+--------+--------+--------+ Distal to Stent    105                              +-------------------+--------+--------+--------+--------+     Left Stent(s): +-------------------+--------+--------+--------+--------+ common iliac arteryPSV cm/sStenosisWaveformComments +-------------------+--------+--------+--------+--------+ Proximal Stent     145                              +-------------------+--------+--------+--------+--------+  Mid Stent          143                              +-------------------+--------+--------+--------+--------+ Distal Stent       151                              +-------------------+--------+--------+--------+--------+ Distal to Stent    189                              +-------------------+--------+--------+--------+--------+     Summary: Stenosis: +--------------------+---------------+ Location            Stent           +--------------------+---------------+ Right Common Iliac  no stenosis     +--------------------+---------------+ Left Common Iliac   no stenosis     +--------------------+---------------+ Right External Iliac50-99% stenosis +--------------------+---------------+ Left External Iliac 50-99% stenosis +--------------------+---------------+   ASSESSMENT:  Common iliac stenosis s/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 He is able to eat what he wants without his abdominal pain changing.  This is not consistent with mesenteric ischemia.  At this point we will stop  evaluating his mesenteric vessels per Dr. Franco Nonesains assessment on 06/29/2017.     PLAN:  Patent common iliac stents with palpable pedal pulses.  No symptoms of claudication with ambulation.  B External iliac stenosis noted on the duplex.  No ABI's were performed this visit we will repeat ABI's next visit as well as iliac duplex.   F/U in 1 year.    Cameron Ware  Cameron Montague Corella PA-C [facility] Office: [phone]  MD in clinic: Randie Heinzain

## 2018-08-09 ENCOUNTER — Other Ambulatory Visit (INDEPENDENT_AMBULATORY_CARE_PROVIDER_SITE_OTHER): Payer: Self-pay | Admitting: Internal Medicine

## 2018-09-25 ENCOUNTER — Emergency Department (HOSPITAL_COMMUNITY)
Admission: EM | Admit: 2018-09-25 | Discharge: 2018-09-26 | Disposition: A | Discharge status: Home / Self Care | Payer: Medicare Other | Attending: Emergency Medicine | Admitting: Emergency Medicine

## 2018-09-25 ENCOUNTER — Emergency Department (HOSPITAL_COMMUNITY): Payer: Medicare Other

## 2018-09-25 ENCOUNTER — Other Ambulatory Visit: Payer: Self-pay

## 2018-09-25 ENCOUNTER — Encounter (HOSPITAL_COMMUNITY): Payer: Self-pay

## 2018-09-25 DIAGNOSIS — Z7982 Long term (current) use of aspirin: Secondary | ICD-10-CM | POA: Diagnosis not present

## 2018-09-25 DIAGNOSIS — Z87891 Personal history of nicotine dependence: Secondary | ICD-10-CM | POA: Diagnosis not present

## 2018-09-25 DIAGNOSIS — Z7902 Long term (current) use of antithrombotics/antiplatelets: Secondary | ICD-10-CM | POA: Diagnosis not present

## 2018-09-25 DIAGNOSIS — R1030 Lower abdominal pain, unspecified: Secondary | ICD-10-CM | POA: Insufficient documentation

## 2018-09-25 DIAGNOSIS — I10 Essential (primary) hypertension: Secondary | ICD-10-CM | POA: Insufficient documentation

## 2018-09-25 DIAGNOSIS — R748 Abnormal levels of other serum enzymes: Secondary | ICD-10-CM | POA: Diagnosis not present

## 2018-09-25 DIAGNOSIS — E876 Hypokalemia: Secondary | ICD-10-CM | POA: Diagnosis not present

## 2018-09-25 DIAGNOSIS — R7989 Other specified abnormal findings of blood chemistry: Secondary | ICD-10-CM

## 2018-09-25 DIAGNOSIS — Z79899 Other long term (current) drug therapy: Secondary | ICD-10-CM | POA: Diagnosis not present

## 2018-09-25 DIAGNOSIS — I739 Peripheral vascular disease, unspecified: Secondary | ICD-10-CM | POA: Insufficient documentation

## 2018-09-25 DIAGNOSIS — R945 Abnormal results of liver function studies: Secondary | ICD-10-CM

## 2018-09-25 DIAGNOSIS — R109 Unspecified abdominal pain: Secondary | ICD-10-CM | POA: Diagnosis present

## 2018-09-25 LAB — CBC WITH DIFFERENTIAL/PLATELET
Abs Immature Granulocytes: 0.05 10*3/uL (ref 0.00–0.07)
Basophils Absolute: 0 10*3/uL (ref 0.0–0.1)
Basophils Relative: 0 %
Eosinophils Absolute: 0 10*3/uL (ref 0.0–0.5)
Eosinophils Relative: 0 %
HCT: 43.8 % (ref 39.0–52.0)
Hemoglobin: 15.2 g/dL (ref 13.0–17.0)
Immature Granulocytes: 0 %
Lymphocytes Relative: 10 %
Lymphs Abs: 1.2 10*3/uL (ref 0.7–4.0)
MCH: 31.6 pg (ref 26.0–34.0)
MCHC: 34.7 g/dL (ref 30.0–36.0)
MCV: 91.1 fL (ref 80.0–100.0)
Monocytes Absolute: 1.5 10*3/uL — ABNORMAL HIGH (ref 0.1–1.0)
Monocytes Relative: 13 %
Neutro Abs: 8.7 10*3/uL — ABNORMAL HIGH (ref 1.7–7.7)
Neutrophils Relative %: 77 %
Platelets: 248 10*3/uL (ref 150–400)
RBC: 4.81 MIL/uL (ref 4.22–5.81)
RDW: 12.7 % (ref 11.5–15.5)
WBC: 11.5 10*3/uL — ABNORMAL HIGH (ref 4.0–10.5)
nRBC: 0 % (ref 0.0–0.2)

## 2018-09-25 MED ORDER — MORPHINE SULFATE (PF) 4 MG/ML IV SOLN
4.0000 mg | Freq: Once | INTRAVENOUS | Status: AC
  Administered 2018-09-25: 4 mg via INTRAVENOUS
  Filled 2018-09-25: qty 1

## 2018-09-25 NOTE — ED Provider Notes (Signed)
[facility] EMERGENCY DEPARTMENT Provider Note   CSN: [id number] Arrival date & time: 09/25/18  2136    History   Chief Complaint Chief Complaint  Patient presents with   Abdominal Pain    HPI Cameron Ware is a 58 y.o. male.   The history is provided by the patient.  He has history of hypertension, hyperlipidemia, peripheral vascular disease, stroke and comes in complaining of worsening of chronic lower abdominal pain.  He states he has had the pain for many years but it was worse tonight.  Pain is constant and he rates it at 8/10.  Is worse with any movement of his legs, but not affected by eating or bowel movements or urination.  He had some mild diarrhea today.  He denies nausea or vomiting.  He denies any urinary difficulty.  He denies fever, chills, sweats.  He takes daily aspirin, but has not taken anything else for pain.  Curiously, he states that the pain is all the way across his lower abdomen but he constantly points to the right lower quadrant and to the midline and never beyond the midline when pointing to where the pain is.  Past Medical History:  Diagnosis Date   Anorexia 11/29/2010   Arthritis    Chest pain    Decreased vision    Depression    Dyspnea on exertion    Generalized headaches    High cholesterol 01/30/2017   Hyperlipidemia    Hypertension    Peripheral vascular disease (HCC)    Stroke [facility](HCC)    Urinary frequency     Patient Active Problem List   Diagnosis Date Noted   High cholesterol 01/30/2017   Abdominal pain 01/30/2017   Peripheral vascular disease, unspecified (HCC) 04/23/2012   Pain in limb 04/23/2012   Occlusion and stenosis of carotid artery without mention of cerebral infarction 08/14/2011   Atherosclerosis of native arteries of the extremities with intermittent claudication 08/14/2011    Past Surgical History:  Procedure Laterality Date   ABDOMINAL AORTAGRAM N/A 08/22/2011   Procedure: ABDOMINAL  Ronny FlurryAORTAGRAM;  Surgeon: Nada LibmanVance W Brabham, MD;  [facility];  Service: Cardiovascular;  Laterality: N/A;   aortogram     COLONOSCOPY N/A 06/07/2017   Procedure: COLONOSCOPY;  Surgeon: Malissa Hippoehman, Najeeb U, MD;  Location: [facility] ENDO [facility];  Service: Endoscopy;  Laterality: N/A;  150   PERCUTANEOUS STENT INTERVENTION Bilateral 08/22/2011   Procedure: PERCUTANEOUS STENT INTERVENTION;  Surgeon: Nada LibmanVance W Brabham, MD;  [facility];  Service: Cardiovascular;  Laterality: Bilateral;   Stents  August 22, 2011   Bilateral iliac PTA and stenting        Home Medications    Prior to Admission medications   Medication Sig Start Date End Date Taking? Authorizing Provider  aspirin EC 325 MG tablet Take 325 mg by mouth daily.    [provider]  atorvastatin (LIPITOR) 80 MG tablet Take 80 mg by mouth daily at 6 PM.     [provider]  cilostazol (PLETAL) 100 MG tablet Take 100 mg by mouth 2 (two) times daily.    [provider]  clopidogrel (PLAVIX) 75 MG tablet Take 1 tablet (75 mg total) by mouth daily. 07/02/13   Pryor OchoaLawson, James D, MD  dicyclomine (BENTYL) 10 MG capsule TAKE 1 CAPSULE THREE TIMES DAILY AS NEEDED FOR STOMACH SPASM 08/12/18   Setzer, Brand Maleserri L, NP  meloxicam (MOBIC) 7.5 MG tablet Take 7.5 mg by mouth daily.  [provider]  pantoprazole (PROTONIX) 40 MG tablet Take 40 mg by mouth daily. 01/17/17   [provider]  potassium chloride (K-DUR) 10 MEQ tablet Take 20 mEq by mouth 2 (two) times daily.  10/02/16   [provider]  pravastatin (PRAVACHOL) 40 MG tablet Take 40 mg by mouth daily.    08/22/11  [provider]    Family History Family History  Problem Relation Age of Onset   Heart disease Mother    Diabetes Father    Colon cancer Father     Social History Social History   Tobacco Use   Smoking status: Former Smoker    Years: 40.00    Types: Cigarettes    Last attempt to quit: 08/04/2012    Years since  quitting: 6.1   Smokeless tobacco: Never Used   Tobacco comment: quit smoking 5 yrs ago. smoked one cigarette yesterday (09/25/2018)  Substance Use Topics   Alcohol use: Yes    Comment: wine every other day   Drug use: No     Allergies   Patient has no known allergies.   Review of Systems Review of Systems  All other systems reviewed and are negative.    Physical Exam Updated Vital Signs BP 131/80    Pulse (!) 102    Temp 100.3 F (37.9 C) (Oral)    Resp 18    Ht 5\' 4"  (1.626 m)    Wt 69.4 kg    SpO2 96%    BMI 26.26 kg/m   Physical Exam Vitals signs and nursing note reviewed.    58 year old male, resting comfortably and in no acute distress. Vital signs are significant for borderline fever borderline elevated heart rate. Oxygen saturation is 96%, which is normal. Head is normocephalic and atraumatic. PERRLA, EOMI. Oropharynx is clear. Neck is nontender and supple without adenopathy or JVD. Back is nontender and there is no CVA tenderness. Lungs are clear without rales, wheezes, or rhonchi. Chest is nontender. Heart has regular rate and rhythm without murmur. Abdomen is soft, mildly distended, and tender in the suprapubic and right suprapubic area with no obvious tenderness in the left suprapubic area.  Maximum tenderness is in the right lower quadrant.  There is no rebound or guarding.  There are no masses or hepatosplenomegaly and peristalsis is normoactive. Extremities have no cyanosis or edema, full range of motion is present. Skin is warm and dry without rash. Neurologic: Mental status is normal, cranial nerves are intact, there are no motor or sensory deficits.  ED Treatments / Results  Labs (all labs ordered are listed, but only abnormal results are displayed) Labs Reviewed  COMPREHENSIVE METABOLIC PANEL - Abnormal; Notable for the following components:      Result Value   Potassium 3.1 (*)    Glucose, Bld 117 (*)    Calcium 8.8 (*)    AST 136 (*)    ALT  64 (*)    Total Bilirubin 1.4 (*)    All other components within normal limits  URINALYSIS, ROUTINE W REFLEX MICROSCOPIC - Abnormal; Notable for the following components:   Specific Gravity, Urine 1.041 (*)    Glucose, UA 50 (*)    Hgb urine dipstick SMALL (*)    All other components within normal limits  CBC WITH DIFFERENTIAL/PLATELET - Abnormal; Notable for the following components:   WBC 11.5 (*)    Neutro Abs 8.7 (*)    Monocytes Absolute 1.5 (*)    All other  components within normal limits  LIPASE, BLOOD  LACTIC ACID, PLASMA   Radiology Ct Abdomen Pelvis W Contrast  Result Date: 09/26/2018 CLINICAL DATA:  58 year old male with lower abdominal pain and fever. EXAM: CT ABDOMEN AND PELVIS WITH CONTRAST TECHNIQUE: Multidetector CT imaging of the abdomen and pelvis was performed using the standard protocol following bolus administration of intravenous contrast. CONTRAST:  OMNIPAQUE IOHEXOL 300 MG/ML  SOLN COMPARISON:  CT Chest, Abdomen, and Pelvis 12/04/2017 and earlier. FINDINGS: Lower chest: 1 centrilobular emphysema. Minor lung base atelectasis. No cardiomegaly, pericardial effusion, pleural effusion. Hepatobiliary: Borderline hepatic steatosis. Gallbladder appears mildly distended but there is no pericholecystic inflammation. No cholelithiasis is evident. No bile duct enlargement. Pancreas: Negative. Spleen: Negative. Adrenals/Urinary Tract: Normal adrenal glands. Bilateral renal enhancement and contrast excretion is symmetric and normal. Normal proximal ureters. 4 millimeter right midpole nephrolithiasis. Numerous pelvic phleboliths. Decompressed and normal ureters to the urinary bladder. Diminutive urinary bladder with mild wall thickening and perhaps mild perivesical stranding (series 2, image 70). No gas within the bladder. Stomach/Bowel: Decompressed and negative rectum. Diverticulosis of the proximal sigmoid and distal descending colon, but no convincing active inflammation  (coronal image 47). Negative splenic flexure, transverse colon, right colon. Normal appendix (series 2, image 57). Negative terminal ileum. No dilated small bowel. Decompressed stomach and duodenum. No free air, free fluid. Vascular/Lymphatic: Severe Aortoiliac calcified atherosclerosis. Major arterial structures remain patent. Portal venous system is patent. No lymphadenopathy. Reproductive: Negative. Other: No pelvic free fluid. Musculoskeletal: No acute osseous abnormality identified. IMPRESSION: 1. Mild bladder wall thickening and perhaps mild perivesical stranding raising the possibility of urinary tract infection. 2. Right nephrolithiasis. No obstructive uropathy. 3. Normal appendix. Diverticulosis of the distal descending and sigmoid colon but no convincing bowel inflammation. 4. Severe Aortic Atherosclerosis (ICD10-I70.0). Emphysema (ICD10-J43.9). Electronically Signed   By: Odessa Fleming M.D.   On: 09/26/2018 01:23    Procedures Procedures  Medications Ordered in ED Medications  potassium chloride SA (K-DUR) CR tablet 40 mEq (has no administration in time range)  morphine 4 MG/ML injection 4 mg (4 mg Intravenous Given 09/25/18 2333)  iohexol (OMNIPAQUE) 300 MG/ML solution 100 mL (100 mLs Intravenous Contrast Given 09/26/18 0043)     Initial Impression / Assessment and Plan / ED Course  I have reviewed the triage vital signs and the nursing notes.  Pertinent labs & imaging results that were available during my care of the patient were reviewed by me and considered in my medical decision making (see chart for details).  Lower abdominal pain which seems to be right lower quadrant based on physical exam.  Old records are reviewed showing he has had bilateral iliac artery stents, and CT angiogram of his abdomen 2 years ago showed a 70 to 80% narrowing of the SMA and 70% narrowing of the celiac artery, no narrowing of the inferior mesenteric artery, but it was noted to be diminutive.  There is  concerned that this could be mesenteric ischemia, so will screen with lactic acid level.  Right lower quadrant tenderness is concerning for possible appendicitis, diverticulitis, possible urinary tract infection.  Screening labs are ordered, as is CT of abdomen and pelvis.  CT is unremarkable.  Labs are significant for hypokalemia, and is given a dose of oral potassium and also given a prescription for K. Dur.  Incidental finding of elevation of ALT and AST as well as total bilirubin.  These elevations are modest and will need to be followed as an outpatient.  I do not  see how they relate to his lower abdominal pain.  He has seen gastroenterology in the past and is encouraged to follow-up with them and with his PCP.  Final Clinical Impressions(s) / ED Diagnoses   Final diagnoses:  Lower abdominal pain  Hypokalemia  Elevated liver function tests  Peripheral vascular disease [facility])    ED Discharge Orders         Ordered    potassium chloride SA (K-DUR) 20 MEQ tablet  2 times daily     09/26/18 0200           Dione Booze, MD 09/26/18 0210

## 2018-09-25 NOTE — ED Triage Notes (Addendum)
Pt reports lower abd pain (squeezing, cramping like)  for 9 years that has progressively gotten worse- pt has appt 20th of May with PCP, has not seen Dr in past 4 months. Pt reports having iliac stents in both legs about 12 years ago. Was evaluated by vascular surgeon in January with normal results per pt. Pt has history of alcohol abuse, now says drinks wine every other day and stopped smoking about 8 years ago,but smoked one yesterday. Pt has fever reported by EMS to be 101. Pt temp in triage is 100.3 orally.

## 2018-09-26 DIAGNOSIS — R1030 Lower abdominal pain, unspecified: Secondary | ICD-10-CM | POA: Diagnosis not present

## 2018-09-26 LAB — COMPREHENSIVE METABOLIC PANEL
ALT: 64 U/L — ABNORMAL HIGH (ref 0–44)
AST: 136 U/L — ABNORMAL HIGH (ref 15–41)
Albumin: 3.9 g/dL (ref 3.5–5.0)
Alkaline Phosphatase: 99 U/L (ref 38–126)
Anion gap: 12 (ref 5–15)
BUN: 12 mg/dL (ref 6–20)
CO2: 23 mmol/L (ref 22–32)
Calcium: 8.8 mg/dL — ABNORMAL LOW (ref 8.9–10.3)
Chloride: 101 mmol/L (ref 98–111)
Creatinine, Ser: 0.83 mg/dL (ref 0.61–1.24)
GFR calc Af Amer: >60 mL/min (ref >60)
GFR calc non Af Amer: >60 mL/min (ref >60)
Glucose, Bld: 117 mg/dL — ABNORMAL HIGH (ref 70–99)
Potassium: 3.1 mmol/L — ABNORMAL LOW (ref 3.5–5.1)
Sodium: 136 mmol/L (ref 135–145)
Total Bilirubin: 1.4 mg/dL — ABNORMAL HIGH (ref 0.3–1.2)
Total Protein: 7 g/dL (ref 6.5–8.1)

## 2018-09-26 LAB — URINALYSIS, ROUTINE W REFLEX MICROSCOPIC
Bacteria, UA: NONE SEEN
Bilirubin Urine: NEGATIVE
Glucose, UA: 50 mg/dL — AB
Ketones, ur: NEGATIVE mg/dL
Leukocytes,Ua: NEGATIVE
Nitrite: NEGATIVE
Protein, ur: NEGATIVE mg/dL
Specific Gravity, Urine: 1.041 — ABNORMAL HIGH (ref 1.005–1.030)
pH: 5 (ref 5.0–8.0)

## 2018-09-26 LAB — LIPASE, BLOOD: Lipase: 19 U/L (ref 11–51)

## 2018-09-26 LAB — LACTIC ACID, PLASMA: Lactic Acid, Venous: 1.8 mmol/L (ref 0.5–1.9)

## 2018-09-26 MED ORDER — POTASSIUM CHLORIDE CRYS ER 20 MEQ PO TBCR
40.0000 meq | EXTENDED_RELEASE_TABLET | Freq: Once | ORAL | Status: AC
  Administered 2018-09-26: 02:00:00 40 meq via ORAL
  Filled 2018-09-26: qty 2

## 2018-09-26 MED ORDER — IOHEXOL 300 MG/ML  SOLN
100.0000 mL | Freq: Once | INTRAMUSCULAR | Status: AC | PRN
  Administered 2018-09-26: 01:00:00 100 mL via INTRAVENOUS

## 2018-09-26 MED ORDER — POTASSIUM CHLORIDE CRYS ER 20 MEQ PO TBCR: 20.0000 meq | EXTENDED_RELEASE_TABLET | Freq: Two times a day (BID) | ORAL | 0 refills | Status: DC

## 2018-09-26 NOTE — Discharge Instructions (Signed)
Your evaluation today did not find any serious problems to cause your pain. Please continue to work with your primary care provider

## 2018-09-26 NOTE — ED Notes (Signed)
Pt using phone, attempting to call for ride home.

## 2018-12-12 ENCOUNTER — Other Ambulatory Visit (INDEPENDENT_AMBULATORY_CARE_PROVIDER_SITE_OTHER): Payer: Self-pay | Admitting: Internal Medicine

## 2019-02-19 ENCOUNTER — Other Ambulatory Visit: Payer: Self-pay

## 2019-02-20 ENCOUNTER — Encounter (INDEPENDENT_AMBULATORY_CARE_PROVIDER_SITE_OTHER): Payer: Self-pay

## 2019-02-20 ENCOUNTER — Encounter: Payer: Self-pay | Admitting: Family Medicine

## 2019-02-20 ENCOUNTER — Ambulatory Visit (INDEPENDENT_AMBULATORY_CARE_PROVIDER_SITE_OTHER): Payer: Medicare Other | Admitting: Family Medicine

## 2019-02-20 VITALS — BP 137/77 | HR 64 | Temp 97.7°F | Ht 64.0 in | Wt 153.6 lb

## 2019-02-20 DIAGNOSIS — E7800 Pure hypercholesterolemia, unspecified: Secondary | ICD-10-CM

## 2019-02-20 DIAGNOSIS — I739 Peripheral vascular disease, unspecified: Secondary | ICD-10-CM

## 2019-02-20 DIAGNOSIS — K219 Gastro-esophageal reflux disease without esophagitis: Secondary | ICD-10-CM | POA: Diagnosis not present

## 2019-02-20 DIAGNOSIS — I10 Essential (primary) hypertension: Secondary | ICD-10-CM | POA: Diagnosis not present

## 2019-02-20 DIAGNOSIS — Z130 Encounter for screening for diseases of the blood and blood-forming organs and certain disorders involving the immune mechanism: Secondary | ICD-10-CM

## 2019-02-20 DIAGNOSIS — R1030 Lower abdominal pain, unspecified: Secondary | ICD-10-CM

## 2019-02-20 DIAGNOSIS — Z23 Encounter for immunization: Secondary | ICD-10-CM

## 2019-02-20 DIAGNOSIS — I70213 Atherosclerosis of native arteries of extremities with intermittent claudication, bilateral legs: Secondary | ICD-10-CM

## 2019-02-20 MED ORDER — DICYCLOMINE HCL 10 MG PO CAPS: 10.0000 mg | ORAL_CAPSULE | Freq: Three times a day (TID) | ORAL | 1 refills | Status: DC | PRN

## 2019-02-20 MED ORDER — TETANUS-DIPHTH-ACELL PERTUSSIS 5-2.5-18.5 LF-MCG/0.5 IM SUSP: 0.5000 mL | Freq: Once | INTRAMUSCULAR | 0 refills | Status: AC

## 2019-02-20 MED ORDER — POTASSIUM CHLORIDE ER 10 MEQ PO TBCR: 20.0000 meq | EXTENDED_RELEASE_TABLET | Freq: Two times a day (BID) | ORAL | 1 refills | Status: DC

## 2019-02-20 MED ORDER — PANTOPRAZOLE SODIUM 40 MG PO TBEC: 40.0000 mg | DELAYED_RELEASE_TABLET | Freq: Every day | ORAL | 1 refills | Status: DC

## 2019-02-20 MED ORDER — ATORVASTATIN CALCIUM 80 MG PO TABS: 80.0000 mg | ORAL_TABLET | Freq: Every day | ORAL | 1 refills | Status: DC

## 2019-02-20 MED ORDER — MELOXICAM 7.5 MG PO TABS: 7.5000 mg | ORAL_TABLET | Freq: Every day | ORAL | 1 refills | Status: DC

## 2019-02-20 MED ORDER — CLOPIDOGREL BISULFATE 75 MG PO TABS: 75.0000 mg | ORAL_TABLET | Freq: Every day | ORAL | 1 refills | Status: DC

## 2019-02-20 NOTE — Progress Notes (Addendum)
New Patient Office Visit  Assessment & Plan:  1. Essential hypertension - Well controlled on current regimen.  - CMP14+EGFR - Lipid panel  2. High cholesterol - Well controlled on current regimen.  - CMP14+EGFR - Lipid panel - atorvastatin (LIPITOR) 80 MG tablet; Take 1 tablet (80 mg total) by mouth daily at 6 PM.  Dispense: 90 tablet; Refill: 1  3. Gastroesophageal reflux disease, esophagitis presence not specified - Well controlled on current regimen.  - CMP14+EGFR - pantoprazole (PROTONIX) 40 MG tablet; Take 1 tablet (40 mg total) by mouth daily.  Dispense: 90 tablet; Refill: 1  4. Lower abdominal pain - No cause has been identified yet through either vascular or GI, CT scans or aortoiliac duplex.   ADDENDUM: I discussed patient with supervising physician and then called patient back to discuss. I offered a referral to physical medicine and rehabilitation for conservative treatment/possible injections. He recalled he did go to a pain management office in [place] where he received six injections across the bottom of his abdomen. He states it was horrible and took a long time to get over. He was prescribed pain medication but states he does not wish to take them so he just quit. He declined the referral and states he is just going to have to live with the pain. I did ask that he let me know if he changes his mind.   5. Peripheral vascular disease, unspecified ([place]) - Managed by vascular. Patient to continue prescribed medications.  - Lipid panel - clopidogrel (PLAVIX) 75 MG tablet; Take 1 tablet (75 mg total) by mouth daily.  Dispense: 90 tablet; Refill: 1  6. Atherosclerosis of native artery of both lower extremities with intermittent claudication ([place]) - Managed by vascular. Patient to continue prescribed medications.   7. Screening for deficiency anemia - CBC with Differential/Platelet  8. Immunization due - Tdap (BOOSTRIX) 5-2.5-18.5 LF-MCG/0.5 injection; Inject 0.5 mLs  into the muscle once for 1 dose.  Dispense: 0.5 mL; Refill: 0  9. Need for immunization against influenza - Flu Vaccine QUAD 36+ mos IM   Follow-up: Return in about 6 months (around 08/20/2019) for annual physical.   Cameron Limes, MSN, APRN, FNP-C [facility]  Subjective:  [name], male    DOB: [birth year]  Age: 58 y.o. MRN: [id number]  Patient Care Team: Loman Brooklyn, FNP as PCP - [facility], [facility] Francella Solian, MD as Referring Physician (Cardiology)  CC:  Chief Complaint  Patient presents with  . New Patient (Initial Visit)    [name]     HPI Cameron Ware presents to establish care. He is transferring care from Dr. Murrell Redden office as he has retired and the office has closed.   Patient reports lower abdominal pain pain that has been constant for years. Pulling/straining worsen the pain. Nothing makes it better unless he has made it worse, then he has to stop the offending activity. He describes the pain as a squeezing sensation and rates it 7/10 on average. Denies trouble with bowels. Pain is not related to food. He does take meloxicam daily but takes it with food. He had a normal colonoscopy last year. Patient has seen GI and vascular for the abdominal pain, neither of which have identified the source of his pain. He has had CT scans and aortoiliac duplex to evaluate the pain as well, neither of which identified a cause of his pain.   Review of Systems  Constitutional:  Negative for chills, fever, malaise/fatigue and weight loss.  HENT: Negative for congestion, ear discharge, ear pain, nosebleeds, sinus pain, sore throat and tinnitus.   Eyes: Negative for blurred vision, double vision, pain, discharge and redness.  Respiratory: Negative for cough, shortness of breath and wheezing.   Cardiovascular: Negative for chest pain, palpitations and leg swelling.  Gastrointestinal: Positive  for abdominal pain. Negative for constipation, diarrhea, heartburn, nausea and vomiting.  Genitourinary: Negative for dysuria, frequency and urgency.  Musculoskeletal: Negative for myalgias.  Skin: Negative for rash.  Neurological: Negative for dizziness, seizures, weakness and headaches.  Psychiatric/Behavioral: Negative for depression, substance abuse and suicidal ideas. The patient is not nervous/anxious.     Current Outpatient Medications:  .  aspirin EC 325 MG tablet, Take 325 mg by mouth daily., Disp: , Rfl:  .  atorvastatin (LIPITOR) 80 MG tablet, Take 1 tablet (80 mg total) by mouth daily at 6 PM., Disp: 90 tablet, Rfl: 1 .  cilostazol (PLETAL) 100 MG tablet, Take 100 mg by mouth 2 (two) times daily., Disp: , Rfl:  .  clopidogrel (PLAVIX) 75 MG tablet, Take 1 tablet (75 mg total) by mouth daily., Disp: 90 tablet, Rfl: 1 .  dicyclomine (BENTYL) 10 MG capsule, Take 1 capsule (10 mg total) by mouth 3 (three) times daily as needed for spasms., Disp: 90 capsule, Rfl: 1 .  meloxicam (MOBIC) 7.5 MG tablet, Take 1 tablet (7.5 mg total) by mouth daily., Disp: 90 tablet, Rfl: 1 .  pantoprazole (PROTONIX) 40 MG tablet, Take 1 tablet (40 mg total) by mouth daily., Disp: 90 tablet, Rfl: 1 .  potassium chloride (K-DUR) 10 MEQ tablet, Take 2 tablets (20 mEq total) by mouth 2 (two) times daily., Disp: 360 tablet, Rfl: 1 .  Tdap (BOOSTRIX) 5-2.5-18.5 LF-MCG/0.5 injection, Inject 0.5 mLs into the muscle once for 1 dose., Disp: 0.5 mL, Rfl: 0  No Known Allergies  Past Medical History:  Diagnosis Date  . Anorexia 11/29/2010  . Arthritis   . Chest pain   . Decreased vision   . Depression   . Dyspnea on exertion   . Generalized headaches   . High cholesterol 01/30/2017  . Hyperlipidemia   . Hypertension   . Peripheral vascular disease ([place])   . Stroke ([place])   . Urinary frequency     Past Surgical History:  Procedure Laterality Date  . ABDOMINAL AORTAGRAM N/A 08/22/2011   Procedure: ABDOMINAL  Maxcine Ham;  Surgeon: Serafina Mitchell, MD;  Location: [facility] CATH LAB;  Service: Cardiovascular;  Laterality: N/A;  . aortogram    . COLONOSCOPY N/A 06/07/2017   Procedure: COLONOSCOPY;  Surgeon: Rogene Houston, MD;  Location: [facility] ENDO SU[facility];  Service: Endoscopy;  Laterality: N/A;  150  . PERCUTANEOUS STENT INTERVENTION Bilateral 08/22/2011   Procedure: PERCUTANEOUS STENT INTERVENTION;  Surgeon: Serafina Mitchell, MD;  Location: [facility] CATH LAB;  Service: Cardiovascular;  Laterality: Bilateral;  . Stents  August 22, 2011   Bilateral iliac PTA and stenting    Family History  Problem Relation Age of Onset  . Heart disease Mother   . Diabetes Father   . Colon cancer Father     Social History   Socioeconomic History  . Marital status: Single    Spouse name: Not on file  . Number of children: Not on file  . Years of education: Not on file  . Highest education level: Not on file  Occupational History  . Not on file  Social Needs  .  Financial resource strain: Not on file  . Food insecurity    Worry: Not on file    Inability: Not on file  . Transportation needs    Medical: Not on file    Non-medical: Not on file  Tobacco Use  . Smoking status: Former Smoker    Years: 40.00    Types: Cigarettes    Quit date: 08/04/2012    Years since quitting: 6.5  . Smokeless tobacco: Never Used  . Tobacco comment: quit smoking 5 yrs ago. smoked one cigarette yesterday (09/25/2018)  Substance and Sexual Activity  . Alcohol use: Yes    Comment: wine every other day; 2-3 a day on 02/20/2019  . Drug use: No  . Sexual activity: Not on file  Lifestyle  . Physical activity    Days per week: Not on file    Minutes per session: Not on file  . Stress: Not on file  Relationships  . Social Herbalist on phone: Not on file    Gets together: Not on file    Attends religious service: Not on file    Active member of club or organization: Not on file    Attends meetings of clubs or organizations: Not on  file    Relationship status: Not on file  . Intimate partner violence    Fear of current or ex partner: Not on file    Emotionally abused: Not on file    Physically abused: Not on file    Forced sexual activity: Not on file  Other Topics Concern  . Not on file  Social History Narrative  . Not on file    Objective:   Today's Vitals: BP 137/77   Pulse 64   Temp 97.7 F (36.5 C) (Temporal)   Ht 5' 4"  (1.626 m)   Wt 153 lb 9.6 oz (69.7 kg)   SpO2 98%   BMI 26.37 kg/m   Physical Exam Vitals signs reviewed.  Constitutional:      General: He is not in acute distress.    Appearance: Normal appearance. He is overweight. He is not ill-appearing, toxic-appearing or diaphoretic.  HENT:     Head: Normocephalic and atraumatic.  Eyes:     General: No scleral icterus.       Right eye: No discharge.        Left eye: No discharge.     Conjunctiva/sclera: Conjunctivae normal.  Neck:     Musculoskeletal: Normal range of motion.  Cardiovascular:     Rate and Rhythm: Normal rate and regular rhythm.     Heart sounds: Normal heart sounds. No murmur. No friction rub. No gallop.   Pulmonary:     Effort: Pulmonary effort is normal. No respiratory distress.     Breath sounds: Normal breath sounds. No stridor. No wheezing, rhonchi or rales.  Musculoskeletal: Normal range of motion.  Skin:    General: Skin is warm and dry.  Neurological:     Mental Status: He is alert and oriented to person, place, and time. Mental status is at baseline.  Psychiatric:        Mood and Affect: Mood normal.        Behavior: Behavior normal.        Thought Content: Thought content normal.        Judgment: Judgment normal.

## 2019-02-21 LAB — LIPID PANEL
Chol/HDL Ratio: 3.5 ratio (ref 0.0–5.0)
Cholesterol, Total: 170 mg/dL (ref 100–199)
HDL: 48 mg/dL (ref >39)
LDL Chol Calc (NIH): 91 mg/dL (ref 0–99)
Triglycerides: 182 mg/dL — ABNORMAL HIGH (ref 0–149)
VLDL Cholesterol Cal: 31 mg/dL (ref 5–40)

## 2019-02-21 LAB — CMP14+EGFR
ALT: 22 IU/L (ref 0–44)
AST: 24 IU/L (ref 0–40)
Albumin/Globulin Ratio: 1.7 (ref 1.2–2.2)
Albumin: 4.3 g/dL (ref 3.8–4.9)
Alkaline Phosphatase: 119 IU/L — ABNORMAL HIGH (ref 39–117)
BUN/Creatinine Ratio: 8 — ABNORMAL LOW (ref 9–20)
BUN: 8 mg/dL (ref 6–24)
Bilirubin Total: 0.4 mg/dL (ref 0.0–1.2)
CO2: 23 mmol/L (ref 20–29)
Calcium: 9.8 mg/dL (ref 8.7–10.2)
Chloride: 100 mmol/L (ref 96–106)
Creatinine, Ser: 0.95 mg/dL (ref 0.76–1.27)
GFR calc Af Amer: 101 mL/min/{1.73_m2} (ref >59)
GFR calc non Af Amer: 87 mL/min/{1.73_m2} (ref >59)
Globulin, Total: 2.5 g/dL (ref 1.5–4.5)
Glucose: 94 mg/dL (ref 65–99)
Potassium: 4.5 mmol/L (ref 3.5–5.2)
Sodium: 138 mmol/L (ref 134–144)
Total Protein: 6.8 g/dL (ref 6.0–8.5)

## 2019-02-21 LAB — CBC WITH DIFFERENTIAL/PLATELET
Basophils Absolute: 0.1 10*3/uL (ref 0.0–0.2)
Basos: 1 %
EOS (ABSOLUTE): 0.1 10*3/uL (ref 0.0–0.4)
Eos: 3 %
Hematocrit: 45 % (ref 37.5–51.0)
Hemoglobin: 15.5 g/dL (ref 13.0–17.7)
Immature Grans (Abs): 0 10*3/uL (ref 0.0–0.1)
Immature Granulocytes: 0 %
Lymphocytes Absolute: 1.3 10*3/uL (ref 0.7–3.1)
Lymphs: 23 %
MCH: 31.1 pg (ref 26.6–33.0)
MCHC: 34.4 g/dL (ref 31.5–35.7)
MCV: 90 fL (ref 79–97)
Monocytes Absolute: 0.7 10*3/uL (ref 0.1–0.9)
Monocytes: 12 %
Neutrophils Absolute: 3.5 10*3/uL (ref 1.4–7.0)
Neutrophils: 61 %
Platelets: 258 10*3/uL (ref 150–450)
RBC: 4.99 x10E6/uL (ref 4.14–5.80)
RDW: 12.9 % (ref 11.6–15.4)
WBC: 5.7 10*3/uL (ref 3.4–10.8)

## 2019-02-25 ENCOUNTER — Encounter: Payer: Self-pay | Admitting: Family Medicine

## 2019-08-17 NOTE — Progress Notes (Deleted)
Assessment & Plan:  ***  Follow-up: No follow-ups on file.   Cameron Boston, MSN, APRN, FNP-C [facility]  Subjective:  [name], male    DOB: Feb 28, 1961  Age: 59 y.o. MRN: [id number]  Patient Care Team: Gwenlyn Fudge, FNP as PCP - [facility] ([facility], [facility] Jodie Echevaria Osie Cheeks, MD as Referring Physician (Cardiology)   CC: No chief complaint on file.   HPI Cameron Ware presents for his annual physical.   Occupation: ***, Marital status: ***, Substance use: *** Diet: ***, Exercise: *** Last eye exam: *** Last dental exam: *** Last colonoscopy: 06/07/2017 Hepatitis C Screening: completed 08/11/2016 PSA: WNL 10/16/2018 Immunizations: Flu Vaccine: up to date Tdap Vaccine: {BFJ Vaccine Multiples:23741}  Shingrix Vaccine: {BFJ Vaccine Multiples:23741}  DEPRESSION SCREENING PHQ 2/9 Scores 02/20/2019  PHQ - 2 Score 0     ROS   Current Outpatient Medications:  .  aspirin EC 325 MG tablet, Take 325 mg by mouth daily., Disp: , Rfl:  .  atorvastatin (LIPITOR) 80 MG tablet, Take 1 tablet (80 mg total) by mouth daily at 6 PM., Disp: 90 tablet, Rfl: 1 .  cilostazol (PLETAL) 100 MG tablet, Take 100 mg by mouth 2 (two) times daily., Disp: , Rfl:  .  clopidogrel (PLAVIX) 75 MG tablet, Take 1 tablet (75 mg total) by mouth daily., Disp: 90 tablet, Rfl: 1 .  dicyclomine (BENTYL) 10 MG capsule, Take 1 capsule (10 mg total) by mouth 3 (three) times daily as needed for spasms., Disp: 90 capsule, Rfl: 1 .  meloxicam (MOBIC) 7.5 MG tablet, Take 1 tablet (7.5 mg total) by mouth daily., Disp: 90 tablet, Rfl: 1 .  pantoprazole (PROTONIX) 40 MG tablet, Take 1 tablet (40 mg total) by mouth daily., Disp: 90 tablet, Rfl: 1 .  potassium chloride (K-DUR) 10 MEQ tablet, Take 2 tablets (20 mEq total) by mouth 2 (two) times daily., Disp: 360 tablet, Rfl: 1  No Known Allergies  Past Medical History:  Diagnosis Date  .  Anorexia 11/29/2010  . Arthritis   . Chest pain   . Decreased vision   . Depression   . Dyspnea on exertion   . Generalized headaches   . High cholesterol 01/30/2017  . Hyperlipidemia   . Hypertension   . Peripheral vascular disease (HCC)   . Stroke (HCC)   . Urinary frequency     Past Surgical History:  Procedure Laterality Date  . ABDOMINAL AORTAGRAM N/A 08/22/2011   Procedure: ABDOMINAL Ronny Flurry;  Surgeon: Nada Libman, MD;  Location: [facility] CATH LAB;  Service: Cardiovascular;  Laterality: N/A;  . aortogram    . COLONOSCOPY N/A 06/07/2017   Procedure: COLONOSCOPY;  Surgeon: Malissa Hippo, MD;  [facility] ENDO [facility];  Service: Endoscopy;  Laterality: N/A;  150  . PERCUTANEOUS STENT INTERVENTION Bilateral 08/22/2011   Procedure: PERCUTANEOUS STENT INTERVENTION;  Surgeon: Nada Libman, MD;  Location: [facility] CATH LAB;  Service: Cardiovascular;  Laterality: Bilateral;  . Stents  August 22, 2011   Bilateral iliac PTA and stenting    Family History  Problem Relation Age of Onset  . Heart disease Mother   . Diabetes Father   . Colon cancer Father     Social History   Socioeconomic History  . Marital status: Single    Spouse name: Not on file  . Number of children: Not on file  . Years of education: Not on file  . Highest education level:  Not on file  Occupational History  . Not on file  Tobacco Use  . Smoking status: Former Smoker    Years: 40.00    Types: Cigarettes    Quit date: 08/04/2012    Years since quitting: 7.0  . Smokeless tobacco: Never Used  . Tobacco comment: quit smoking 5 yrs ago. smoked one cigarette yesterday (09/25/2018)  Substance and Sexual Activity  . Alcohol use: Yes    Comment: wine every other day; 2-3 a day on 02/20/2019  . Drug use: No  . Sexual activity: Not on file  Other Topics Concern  . Not on file  Social History Narrative  . Not on file   Social Determinants of Health   Financial Resource Strain:   . Difficulty of Paying Living  Expenses:   Food Insecurity:   . Worried About Charity fundraiser in the Last Year:   . Arboriculturist in the Last Year:   Transportation Needs:   . Film/video editor (Medical):   Marland Kitchen Lack of Transportation (Non-Medical):   Physical Activity:   . Days of Exercise per Week:   . Minutes of Exercise per Session:   Stress:   . Feeling of Stress :   Social Connections:   . Frequency of Communication with Friends and Family:   . Frequency of Social Gatherings with Friends and Family:   . Attends Religious Services:   . Active Member of Clubs or Organizations:   . Attends Archivist Meetings:   Marland Kitchen Marital Status:   Intimate Partner Violence:   . Fear of Current or Ex-Partner:   . Emotionally Abused:   Marland Kitchen Physically Abused:   . Sexually Abused:       Objective:    There were no vitals taken for this visit.  Wt Readings from Last 3 Encounters:  02/20/19 153 lb 9.6 oz (69.7 kg)  09/25/18 153 lb (69.4 kg)  01/23/18 143 lb (64.9 kg)    Physical Exam  No results found for: TSH Lab Results  Component Value Date   WBC 5.7 02/20/2019   HGB 15.5 02/20/2019   HCT 45.0 02/20/2019   MCV 90 02/20/2019   PLT 258 02/20/2019   Lab Results  Component Value Date   NA 138 02/20/2019   K 4.5 02/20/2019   CO2 23 02/20/2019   GLUCOSE 94 02/20/2019   BUN 8 02/20/2019   CREATININE 0.95 02/20/2019   BILITOT 0.4 02/20/2019   ALKPHOS 119 (H) 02/20/2019   AST 24 02/20/2019   ALT 22 02/20/2019   PROT 6.8 02/20/2019   ALBUMIN 4.3 02/20/2019   CALCIUM 9.8 02/20/2019   ANIONGAP 12 09/25/2018   Lab Results  Component Value Date   CHOL 170 02/20/2019   Lab Results  Component Value Date   HDL 48 02/20/2019   Lab Results  Component Value Date   LDLCALC 91 02/20/2019   Lab Results  Component Value Date   TRIG 182 (H) 02/20/2019   Lab Results  Component Value Date   CHOLHDL 3.5 02/20/2019   No results found for: HGBA1C

## 2019-08-20 ENCOUNTER — Ambulatory Visit (INDEPENDENT_AMBULATORY_CARE_PROVIDER_SITE_OTHER): Payer: Medicare Other | Admitting: Family Medicine

## 2019-08-20 ENCOUNTER — Ambulatory Visit: Payer: Medicare Other | Admitting: Family Medicine

## 2019-08-20 ENCOUNTER — Other Ambulatory Visit: Payer: Self-pay

## 2019-08-20 ENCOUNTER — Encounter: Payer: Self-pay | Admitting: Family Medicine

## 2019-08-20 VITALS — BP 158/73 | HR 56 | Temp 97.5°F | Ht 64.0 in | Wt 152.4 lb

## 2019-08-20 DIAGNOSIS — I70213 Atherosclerosis of native arteries of extremities with intermittent claudication, bilateral legs: Secondary | ICD-10-CM

## 2019-08-20 DIAGNOSIS — Z0001 Encounter for general adult medical examination with abnormal findings: Secondary | ICD-10-CM | POA: Diagnosis not present

## 2019-08-20 DIAGNOSIS — E7800 Pure hypercholesterolemia, unspecified: Secondary | ICD-10-CM | POA: Diagnosis not present

## 2019-08-20 DIAGNOSIS — I10 Essential (primary) hypertension: Secondary | ICD-10-CM | POA: Diagnosis not present

## 2019-08-20 DIAGNOSIS — Z0000 Encounter for general adult medical examination without abnormal findings: Secondary | ICD-10-CM

## 2019-08-20 DIAGNOSIS — K219 Gastro-esophageal reflux disease without esophagitis: Secondary | ICD-10-CM

## 2019-08-20 DIAGNOSIS — R1030 Lower abdominal pain, unspecified: Secondary | ICD-10-CM

## 2019-08-20 DIAGNOSIS — I739 Peripheral vascular disease, unspecified: Secondary | ICD-10-CM

## 2019-08-20 MED ORDER — DICYCLOMINE HCL 10 MG PO CAPS: 10.0000 mg | ORAL_CAPSULE | Freq: Three times a day (TID) | ORAL | 2 refills | Status: DC | PRN

## 2019-08-20 MED ORDER — PANTOPRAZOLE SODIUM 40 MG PO TBEC: 40.0000 mg | DELAYED_RELEASE_TABLET | Freq: Every day | ORAL | 3 refills | Status: DC

## 2019-08-20 MED ORDER — ATORVASTATIN CALCIUM 80 MG PO TABS: 80.0000 mg | ORAL_TABLET | Freq: Every day | ORAL | 3 refills | Status: DC

## 2019-08-20 MED ORDER — POTASSIUM CHLORIDE ER 10 MEQ PO TBCR: 20.0000 meq | EXTENDED_RELEASE_TABLET | Freq: Two times a day (BID) | ORAL | 3 refills | Status: DC

## 2019-08-20 NOTE — Progress Notes (Signed)
Assessment & Plan:  1. Well adult exam - Preventive health education provided. Patient declined HIV screening and the low-dose lung cancer screening CT. UTD with colonoscopy, TDAP, Hep C screening, and influenza. He will be looking into Shingrix and the COVID-19 vaccine.  - CBC with Differential/Platelet - CMP14+EGFR - Lipid panel  2. Essential hypertension - Not on medication. Elevated today. Education provided on the DASH diet. Encouraged lifestyle changes. Patient to keep a log of his BP to bring back with him to his next appointment.   3. Gastroesophageal reflux disease, unspecified whether esophagitis present - Well controlled on current regimen.  - pantoprazole (PROTONIX) 40 MG tablet; Take 1 tablet (40 mg total) by mouth daily.  Dispense: 90 tablet; Refill: 3  4. High cholesterol - Well controlled on current regimen.  - atorvastatin (LIPITOR) 80 MG tablet; Take 1 tablet (80 mg total) by mouth daily at 6 PM.  Dispense: 90 tablet; Refill: 3  5. Lower abdominal pain - Chronic, unchanging. Previously declined referral to physical medicine and rehabilitation for conservative treatment/possible injections. Has seen pain management in the past but did not wish to continue these medications.  - dicyclomine (BENTYL) 10 MG capsule; Take 1 capsule (10 mg total) by mouth 3 (three) times daily as needed for spasms.  Dispense: 90 capsule; Refill: 2  6. Peripheral vascular disease, unspecified ([place]) - Managed by vascular and has appointment later this month.   7. Atherosclerosis of native artery of both lower extremities with intermittent claudication ([place]) - Managed by vascular and has appointment laster this month.    Follow-up: Return in about 4 weeks (around 09/17/2019) for HTN.   Hendricks Limes, MSN, APRN, FNP-C [facility]  Subjective:  [name], male    DOB: 01-03-1961  Age: 59 y.o. MRN: [id number]  Patient Care Team: Loman Brooklyn, FNP as PCP - [facility], [facility] Edd Arbour Francella Solian, MD as Referring Physician (Cardiology)   CC:  Chief Complaint  Patient presents with  . Annual Exam    HPI Cameron Ware presents for his annual physical.   Occupation: disability, Marital status: single, Substance use: marijuana Diet: regular, Exercise: walking as much as he can Last eye exam: a long time ago Last dental exam: dentures Last colonoscopy: 06/07/2017 Lung Cancer Screening with low-dose Chest CT: declined Hepatitis C Screening: 08/11/2016 PSA: 1.9 in 2020 Immunizations: Flu Vaccine: up to date Tdap Vaccine: up to date  Shingrix Vaccine: patient is checking on this  COVID-19 Vaccine: patient is checking on this  DEPRESSION SCREENING PHQ 2/9 Scores 08/20/2019 02/20/2019  PHQ - 2 Score 0 0     Review of Systems  Constitutional: Negative for chills, fever, malaise/fatigue and weight loss.  HENT: Negative for congestion, ear discharge, ear pain, nosebleeds, sinus pain, sore throat and tinnitus.   Eyes: Negative for blurred vision, double vision, pain, discharge and redness.  Respiratory: Negative for cough, shortness of breath and wheezing.   Cardiovascular: Negative for chest pain, palpitations and leg swelling.  Gastrointestinal: Positive for abdominal pain. Negative for constipation, diarrhea, heartburn, nausea and vomiting.  Genitourinary: Negative for dysuria, frequency and urgency.       Denies trouble initiating a urine stream, weak stream, split stream, and dribbling.   Musculoskeletal: Negative for myalgias.  Skin: Negative for rash.  Neurological: Negative for dizziness, seizures, weakness and headaches.  Psychiatric/Behavioral: Negative for depression, substance abuse and suicidal ideas. The patient is not nervous/anxious.  Current Outpatient Medications:  .  aspirin EC 325 MG tablet, Take 325 mg by mouth daily., Disp: , Rfl:  .  atorvastatin  (LIPITOR) 80 MG tablet, Take 1 tablet (80 mg total) by mouth daily at 6 PM., Disp: 90 tablet, Rfl: 3 .  cilostazol (PLETAL) 100 MG tablet, Take 100 mg by mouth 2 (two) times daily., Disp: , Rfl:  .  clopidogrel (PLAVIX) 75 MG tablet, Take 1 tablet (75 mg total) by mouth daily., Disp: 90 tablet, Rfl: 1 .  dicyclomine (BENTYL) 10 MG capsule, Take 1 capsule (10 mg total) by mouth 3 (three) times daily as needed for spasms., Disp: 90 capsule, Rfl: 2 .  meloxicam (MOBIC) 7.5 MG tablet, Take 1 tablet (7.5 mg total) by mouth daily., Disp: 90 tablet, Rfl: 1 .  pantoprazole (PROTONIX) 40 MG tablet, Take 1 tablet (40 mg total) by mouth daily., Disp: 90 tablet, Rfl: 3 .  potassium chloride (KLOR-CON) 10 MEQ tablet, Take 2 tablets (20 mEq total) by mouth 2 (two) times daily., Disp: 360 tablet, Rfl: 3  No Known Allergies  Past Medical History:  Diagnosis Date  . Anorexia 11/29/2010  . Arthritis   . Chest pain   . Decreased vision   . Depression   . Dyspnea on exertion   . Generalized headaches   . High cholesterol 01/30/2017  . Hyperlipidemia   . Hypertension   . Peripheral vascular disease ([place])   . Stroke ([place])   . Urinary frequency     Past Surgical History:  Procedure Laterality Date  . ABDOMINAL AORTAGRAM N/A 08/22/2011   Procedure: ABDOMINAL Maxcine Ham;  Surgeon: Serafina Mitchell, MD;  Location: [facility] CATH LAB;  Service: Cardiovascular;  Laterality: N/A;  . aortogram    . COLONOSCOPY N/A 06/07/2017   Procedure: COLONOSCOPY;  Surgeon: Rogene Houston, MD;  [facility];  Service: Endoscopy;  Laterality: N/A;  150  . PERCUTANEOUS STENT INTERVENTION Bilateral 08/22/2011   Procedure: PERCUTANEOUS STENT INTERVENTION;  Surgeon: Serafina Mitchell, MD;  [facility] CATH LAB;  Service: Cardiovascular;  Laterality: Bilateral;  . Stents  August 22, 2011   Bilateral iliac PTA and stenting    Family History  Problem Relation Age of Onset  . Heart disease Mother   . Diabetes Father   . Colon  cancer Father     Social History   Socioeconomic History  . Marital status: Single    Spouse name: Not on file  . Number of children: Not on file  . Years of education: Not on file  . Highest education level: Not on file  Occupational History  . Not on file  Tobacco Use  . Smoking status: Former Smoker    Packs/day: 1.00    Years: 40.00    Pack years: 40.00    Types: Cigarettes    Quit date: 08/04/2012    Years since quitting: 7.0  . Smokeless tobacco: Never Used  . Tobacco comment: quit smoking 5 yrs ago. smoked one cigarette yesterday (09/25/2018)  Substance and Sexual Activity  . Alcohol use: Yes    Comment: wine every other day; 2-3 a day on 02/20/2019  . Drug use: Yes    Types: Marijuana  . Sexual activity: Not on file  Other Topics Concern  . Not on file  Social History Narrative  . Not on file   Social Determinants of Health   Financial Resource Strain:   . Difficulty of Paying Living Expenses:   Food Insecurity:   .  Worried About Charity fundraiser in the Last Year:   . Arboriculturist in the Last Year:   Transportation Needs:   . Film/video editor (Medical):   Marland Kitchen Lack of Transportation (Non-Medical):   Physical Activity:   . Days of Exercise per Week:   . Minutes of Exercise per Session:   Stress:   . Feeling of Stress :   Social Connections:   . Frequency of Communication with Friends and Family:   . Frequency of Social Gatherings with Friends and Family:   . Attends Religious Services:   . Active Member of Clubs or Organizations:   . Attends Archivist Meetings:   Marland Kitchen Marital Status:   Intimate Partner Violence:   . Fear of Current or Ex-Partner:   . Emotionally Abused:   Marland Kitchen Physically Abused:   . Sexually Abused:       Objective:    BP (!) 158/73   Pulse (!) 56   Temp (!) 97.5 F (36.4 C) (Temporal)   Ht '5\' 4"'$  (1.626 m)   Wt 152 lb 6.4 oz (69.1 kg)   SpO2 99%   BMI 26.16 kg/m   Wt Readings from Last 3 Encounters:    08/20/19 152 lb 6.4 oz (69.1 kg)  02/20/19 153 lb 9.6 oz (69.7 kg)  09/25/18 153 lb (69.4 kg)    Physical Exam Vitals reviewed.  Constitutional:      General: He is not in acute distress.    Appearance: Normal appearance. He is overweight. He is not ill-appearing, toxic-appearing or diaphoretic.  HENT:     Head: Normocephalic and atraumatic.     Right Ear: Tympanic membrane, ear canal and external ear normal. There is no impacted cerumen.     Left Ear: Tympanic membrane, ear canal and external ear normal. There is no impacted cerumen.     Nose: Nose normal. No congestion or rhinorrhea.     Mouth/Throat:     Mouth: Mucous membranes are moist.     Pharynx: Oropharynx is clear. No oropharyngeal exudate or posterior oropharyngeal erythema.  Eyes:     General: No scleral icterus.       Right eye: No discharge.        Left eye: No discharge.     Conjunctiva/sclera: Conjunctivae normal.     Pupils: Pupils are equal, round, and reactive to light.  Cardiovascular:     Rate and Rhythm: Normal rate and regular rhythm.     Heart sounds: Normal heart sounds. No murmur. No friction rub. No gallop.   Pulmonary:     Effort: Pulmonary effort is normal. No respiratory distress.     Breath sounds: Normal breath sounds. No stridor. No wheezing, rhonchi or rales.  Abdominal:     General: Abdomen is flat. Bowel sounds are normal. There is no distension.     Palpations: Abdomen is soft. There is no mass.     Tenderness: There is abdominal tenderness in the right lower quadrant and left lower quadrant. There is no guarding or rebound.     Hernia: No hernia is present.  Musculoskeletal:        General: Normal range of motion.     Cervical back: Normal range of motion and neck supple. No rigidity. No muscular tenderness.     Right lower leg: No edema.     Left lower leg: No edema.  Lymphadenopathy:     Cervical: No cervical adenopathy.  Skin:    General:  Skin is warm and dry.     Capillary  Refill: Capillary refill takes less than 2 seconds.  Neurological:     General: No focal deficit present.     Mental Status: He is alert and oriented to person, place, and time. Mental status is at baseline.  Psychiatric:        Mood and Affect: Mood normal.        Behavior: Behavior normal.        Thought Content: Thought content normal.        Judgment: Judgment normal.     No results found for: TSH Lab Results  Component Value Date   WBC 5.7 02/20/2019   HGB 15.5 02/20/2019   HCT 45.0 02/20/2019   MCV 90 02/20/2019   PLT 258 02/20/2019   Lab Results  Component Value Date   NA 138 02/20/2019   K 4.5 02/20/2019   CO2 23 02/20/2019   GLUCOSE 94 02/20/2019   BUN 8 02/20/2019   CREATININE 0.95 02/20/2019   BILITOT 0.4 02/20/2019   ALKPHOS 119 (H) 02/20/2019   AST 24 02/20/2019   ALT 22 02/20/2019   PROT 6.8 02/20/2019   ALBUMIN 4.3 02/20/2019   CALCIUM 9.8 02/20/2019   ANIONGAP 12 09/25/2018   Lab Results  Component Value Date   CHOL 170 02/20/2019   Lab Results  Component Value Date   HDL 48 02/20/2019   Lab Results  Component Value Date   LDLCALC 91 02/20/2019   Lab Results  Component Value Date   TRIG 182 (H) 02/20/2019   Lab Results  Component Value Date   CHOLHDL 3.5 02/20/2019   No results found for: HGBA1C

## 2019-08-20 NOTE — Patient Instructions (Addendum)
DASH Eating Plan DASH stands for "Dietary Approaches to Stop Hypertension." The DASH eating plan is a healthy eating plan that has been shown to reduce high blood pressure (hypertension). It may also reduce your risk for type 2 diabetes, heart disease, and stroke. The DASH eating plan may also help with weight loss. What are tips for following this plan?  General guidelines  Avoid eating more than 2,300 mg (milligrams) of salt (sodium) a day. If you have hypertension, you may need to reduce your sodium intake to 1,500 mg a day.  Limit alcohol intake to no more than 1 drink a day for nonpregnant women and 2 drinks a day for men. One drink equals 12 oz of beer, 5 oz of wine, or 1 oz of hard liquor.  Work with your health care provider to maintain a healthy body weight or to lose weight. Ask what an ideal weight is for you.  Get at least 30 minutes of exercise that causes your heart to beat faster (aerobic exercise) most days of the week. Activities may include walking, swimming, or biking.  Work with your health care provider or diet and nutrition specialist (dietitian) to adjust your eating plan to your individual calorie needs. Reading food labels   Check food labels for the amount of sodium per serving. Choose foods with less than 5 percent of the Daily Value of sodium. Generally, foods with less than 300 mg of sodium per serving fit into this eating plan.  To find whole grains, look for the word "whole" as the first word in the ingredient list. Shopping  Buy products labeled as "low-sodium" or "no salt added."  Buy fresh foods. Avoid canned foods and premade or frozen meals. Cooking  Avoid adding salt when cooking. Use salt-free seasonings or herbs instead of table salt or sea salt. Check with your health care provider or pharmacist before using salt substitutes.  Do not fry foods. Cook foods using healthy methods such as baking, boiling, grilling, and broiling instead.  Cook  with heart-healthy oils, such as olive, canola, soybean, or sunflower oil. Meal planning  Eat a balanced diet that includes: ? 5 or more servings of fruits and vegetables each day. At each meal, try to fill half of your plate with fruits and vegetables. ? Up to 6-8 servings of whole grains each day. ? Less than 6 oz of lean meat, poultry, or fish each day. A 3-oz serving of meat is about the same size as a deck of cards. One egg equals 1 oz. ? 2 servings of low-fat dairy each day. ? A serving of nuts, seeds, or beans 5 times each week. ? Heart-healthy fats. Healthy fats called Omega-3 fatty acids are found in foods such as flaxseeds and coldwater fish, like sardines, salmon, and mackerel.  Limit how much you eat of the following: ? Canned or prepackaged foods. ? Food that is high in trans fat, such as fried foods. ? Food that is high in saturated fat, such as fatty meat. ? Sweets, desserts, sugary drinks, and other foods with added sugar. ? Full-fat dairy products.  Do not salt foods before eating.  Try to eat at least 2 vegetarian meals each week.  Eat more home-cooked food and less restaurant, buffet, and fast food.  When eating at a restaurant, ask that your food be prepared with less salt or no salt, if possible. What foods are recommended? The items listed may not be a complete list. Talk with your dietitian  about what dietary choices are best for you. Grains Whole-grain or whole-wheat bread. Whole-grain or whole-wheat pasta. Brown rice. [place]row. Bulgur. Whole-grain and low-sodium cereals. Pita bread. Low-fat, low-sodium crackers. Whole-wheat flour tortillas. Vegetables Fresh or frozen vegetables (raw, steamed, roasted, or grilled). Low-sodium or reduced-sodium tomato and vegetable juice. Low-sodium or reduced-sodium tomato sauce and tomato paste. Low-sodium or reduced-sodium canned vegetables. Fruits All fresh, dried, or frozen fruit. Canned fruit in natural juice  (without added sugar). Meat and other protein foods Skinless chicken or [place]. Ground chicken or [place]. Pork with fat trimmed off. Fish and seafood. Egg whites. Dried beans, peas, or lentils. Unsalted nuts, nut butters, and seeds. Unsalted canned beans. Lean cuts of beef with fat trimmed off. Low-sodium, lean deli meat. Dairy Low-fat (1%) or fat-free (skim) milk. Fat-free, low-fat, or reduced-fat cheeses. Nonfat, low-sodium ricotta or cottage cheese. Low-fat or nonfat yogurt. Low-fat, low-sodium cheese. Fats and oils Soft margarine without trans fats. Vegetable oil. Low-fat, reduced-fat, or light mayonnaise and salad dressings (reduced-sodium). Canola, safflower, olive, soybean, and sunflower oils. Avocado. Seasoning and other foods Herbs. Spices. Seasoning mixes without salt. Unsalted popcorn and pretzels. Fat-free sweets. What foods are not recommended? The items listed may not be a complete list. Talk with your dietitian about what dietary choices are best for you. Grains Baked goods made with fat, such as croissants, muffins, or some breads. Dry pasta or rice meal packs. Vegetables Creamed or fried vegetables. Vegetables in a cheese sauce. Regular canned vegetables (not low-sodium or reduced-sodium). Regular canned tomato sauce and paste (not low-sodium or reduced-sodium). Regular tomato and vegetable juice (not low-sodium or reduced-sodium). Angie Fava. Olives. Fruits Canned fruit in a light or heavy syrup. Fried fruit. Fruit in cream or butter sauce. Meat and other protein foods Fatty cuts of meat. Ribs. Fried meat. Berniece Salines. Sausage. Bologna and other processed lunch meats. Salami. Fatback. Hotdogs. Bratwurst. Salted nuts and seeds. Canned beans with added salt. Canned or smoked fish. Whole eggs or egg yolks. Chicken or [place] with skin. Dairy Whole or 2% milk, cream, and half-and-half. Whole or full-fat cream cheese. Whole-fat or sweetened yogurt. Full-fat cheese. Nondairy creamers. Whipped  toppings. Processed cheese and cheese spreads. Fats and oils Butter. Stick margarine. Lard. Shortening. Ghee. Bacon fat. Tropical oils, such as coconut, palm kernel, or palm oil. Seasoning and other foods Salted popcorn and pretzels. Onion salt, garlic salt, seasoned salt, table salt, and sea salt. Worcestershire sauce. Tartar sauce. Barbecue sauce. Teriyaki sauce. Soy sauce, including reduced-sodium. Steak sauce. Canned and packaged gravies. Fish sauce. Oyster sauce. Cocktail sauce. Horseradish that you find on the shelf. Ketchup. Mustard. Meat flavorings and tenderizers. Bouillon cubes. Hot sauce and Tabasco sauce. Premade or packaged marinades. Premade or packaged taco seasonings. Relishes. Regular salad dressings. Where to find more information:  National Heart, Lung, and [place]: https://wilson-eaton.com/  American Heart Association: www.heart.org Summary  The DASH eating plan is a healthy eating plan that has been shown to reduce high blood pressure (hypertension). It may also reduce your risk for type 2 diabetes, heart disease, and stroke.  With the DASH eating plan, you should limit salt (sodium) intake to 2,300 mg a day. If you have hypertension, you may need to reduce your sodium intake to 1,500 mg a day.  When on the DASH eating plan, aim to eat more fresh fruits and vegetables, whole grains, lean proteins, low-fat dairy, and heart-healthy fats.  Work with your health care provider or diet and nutrition specialist (dietitian) to adjust your eating plan to  individual calorie needs. This information is not intended to replace advice given to you by your health care provider. Make sure you discuss any questions you have with your health care provider. Document Revised: 04/27/2017 Document Reviewed: 05/08/2016 Elsevier Patient Education  2020 Elsevier Inc.   Preventive Care 40-64 Years Old, Male Preventive care refers to lifestyle choices and visits with your health care provider that can  promote health and wellness. This includes:  A yearly physical exam. This is also called an annual well check.  Regular dental and eye exams.  Immunizations.  Screening for certain conditions.  Healthy lifestyle choices, such as eating a healthy diet, getting regular exercise, not using drugs or products that contain nicotine and tobacco, and limiting alcohol use. What can I expect for my preventive care visit? Physical exam Your health care provider will check:  Height and weight. These may be used to calculate body mass index (BMI), which is a measurement that tells if you are at a healthy weight.  Heart rate and blood pressure.  Your skin for abnormal spots. Counseling Your health care provider may ask you questions about:  Alcohol, tobacco, and drug use.  Emotional well-being.  Home and relationship well-being.  Sexual activity.  Eating habits.  Work and work environment. What immunizations do I need?  Influenza (flu) vaccine  This is recommended every year. Tetanus, diphtheria, and pertussis (Tdap) vaccine  You may need a Td booster every 10 years. Varicella (chickenpox) vaccine  You may need this vaccine if you have not already been vaccinated. Zoster (shingles) vaccine  You may need this after age 60. Measles, mumps, and rubella (MMR) vaccine  You may need at least one dose of MMR if you were born in 1957 or later. You may also need a second dose. Pneumococcal conjugate (PCV13) vaccine  You may need this if you have certain conditions and were not previously vaccinated. Pneumococcal polysaccharide (PPSV23) vaccine  You may need one or two doses if you smoke cigarettes or if you have certain conditions. Meningococcal conjugate (MenACWY) vaccine  You may need this if you have certain conditions. Hepatitis A vaccine  You may need this if you have certain conditions or if you travel or work in places where you may be exposed to hepatitis A. Hepatitis  B vaccine  You may need this if you have certain conditions or if you travel or work in places where you may be exposed to hepatitis B. Haemophilus influenzae type b (Hib) vaccine  You may need this if you have certain risk factors. Human papillomavirus (HPV) vaccine  If recommended by your health care provider, you may need three doses over 6 months. You may receive vaccines as individual doses or as more than one vaccine together in one shot (combination vaccines). Talk with your health care provider about the risks and benefits of combination vaccines. What tests do I need? Blood tests  Lipid and cholesterol levels. These may be checked every 5 years, or more frequently if you are over 50 years old.  Hepatitis C test.  Hepatitis B test. Screening  Lung cancer screening. You may have this screening every year starting at age 55 if you have a 30-pack-year history of smoking and currently smoke or have quit within the past 15 years.  Prostate cancer screening. Recommendations will vary depending on your family history and other risks.  Colorectal cancer screening. All adults should have this screening starting at age 50 and continuing until age 75. Your health   care provider may recommend screening at age 45 if you are at increased risk. You will have tests every 1-10 years, depending on your results and the type of screening test.  Diabetes screening. This is done by checking your blood sugar (glucose) after you have not eaten for a while (fasting). You may have this done every 1-3 years.  Sexually transmitted disease (STD) testing. Follow these instructions at home: Eating and drinking  Eat a diet that includes fresh fruits and vegetables, whole grains, lean protein, and low-fat dairy products.  Take vitamin and mineral supplements as recommended by your health care provider.  Do not drink alcohol if your health care provider tells you not to drink.  If you drink  alcohol: ? Limit how much you have to 0-2 drinks a day. ? Be aware of how much alcohol is in your drink. In the [place] one drink equals one 12 oz bottle of beer (355 mL), one 5 oz glass of wine (148 mL), or one 1 oz glass of hard liquor (44 mL). Lifestyle  Take daily care of your teeth and gums.  Stay active. Exercise for at least 30 minutes on 5 or more days each week.  Do not use any products that contain nicotine or tobacco, such as cigarettes, e-cigarettes, and chewing tobacco. If you need help quitting, ask your health care provider.  If you are sexually active, practice safe sex. Use a condom or other form of protection to prevent STIs (sexually transmitted infections).  Talk with your health care provider about taking a low-dose aspirin every day starting at age 50. What's next?  Go to your health care provider once a year for a well check visit.  Ask your health care provider how often you should have your eyes and teeth checked.  Stay up to date on all vaccines. This information is not intended to replace advice given to you by your health care provider. Make sure you discuss any questions you have with your health care provider. Document Revised: 05/09/2018 Document Reviewed: 05/09/2018 Elsevier Patient Education  2020 Elsevier Inc.  

## 2019-08-21 ENCOUNTER — Other Ambulatory Visit: Payer: Self-pay | Admitting: *Deleted

## 2019-08-21 ENCOUNTER — Telehealth (HOSPITAL_COMMUNITY): Payer: Self-pay

## 2019-08-21 DIAGNOSIS — I771 Stricture of artery: Secondary | ICD-10-CM

## 2019-08-21 DIAGNOSIS — I739 Peripheral vascular disease, unspecified: Secondary | ICD-10-CM

## 2019-08-21 LAB — LIPID PANEL
Chol/HDL Ratio: 2.7 ratio (ref 0.0–5.0)
Cholesterol, Total: 132 mg/dL (ref 100–199)
HDL: 49 mg/dL (ref >39)
LDL Chol Calc (NIH): 64 mg/dL (ref 0–99)
Triglycerides: 106 mg/dL (ref 0–149)
VLDL Cholesterol Cal: 19 mg/dL (ref 5–40)

## 2019-08-21 LAB — CMP14+EGFR
ALT: 17 IU/L (ref 0–44)
AST: 16 IU/L (ref 0–40)
Albumin/Globulin Ratio: 1.9 (ref 1.2–2.2)
Albumin: 4.6 g/dL (ref 3.8–4.9)
Alkaline Phosphatase: 120 IU/L — ABNORMAL HIGH (ref 39–117)
BUN/Creatinine Ratio: 9 — ABNORMAL LOW (ref 10–24)
BUN: 9 mg/dL (ref 8–27)
Bilirubin Total: 0.5 mg/dL (ref 0.0–1.2)
CO2: 23 mmol/L (ref 20–29)
Calcium: 10.2 mg/dL (ref 8.6–10.2)
Chloride: 100 mmol/L (ref 96–106)
Creatinine, Ser: 1.02 mg/dL (ref 0.76–1.27)
GFR calc Af Amer: 92 mL/min/{1.73_m2} (ref >59)
GFR calc non Af Amer: 80 mL/min/{1.73_m2} (ref >59)
Globulin, Total: 2.4 g/dL (ref 1.5–4.5)
Glucose: 88 mg/dL (ref 65–99)
Potassium: 4.5 mmol/L (ref 3.5–5.2)
Sodium: 136 mmol/L (ref 134–144)
Total Protein: 7 g/dL (ref 6.0–8.5)

## 2019-08-21 LAB — CBC WITH DIFFERENTIAL/PLATELET
Basophils Absolute: 0.1 10*3/uL (ref 0.0–0.2)
Basos: 1 %
EOS (ABSOLUTE): 0.2 10*3/uL (ref 0.0–0.4)
Eos: 3 %
Hematocrit: 48.2 % (ref 37.5–51.0)
Hemoglobin: 16.7 g/dL (ref 13.0–17.7)
Immature Grans (Abs): 0 10*3/uL (ref 0.0–0.1)
Immature Granulocytes: 0 %
Lymphocytes Absolute: 1.3 10*3/uL (ref 0.7–3.1)
Lymphs: 18 %
MCH: 31.1 pg (ref 26.6–33.0)
MCHC: 34.6 g/dL (ref 31.5–35.7)
MCV: 90 fL (ref 79–97)
Monocytes Absolute: 0.8 10*3/uL (ref 0.1–0.9)
Monocytes: 11 %
Neutrophils Absolute: 4.7 10*3/uL (ref 1.4–7.0)
Neutrophils: 67 %
Platelets: 304 10*3/uL (ref 150–450)
RBC: 5.37 x10E6/uL (ref 4.14–5.80)
RDW: 12.5 % (ref 11.6–15.4)
WBC: 7.1 10*3/uL (ref 3.4–10.8)

## 2019-08-21 NOTE — Telephone Encounter (Signed)

## 2019-08-22 ENCOUNTER — Encounter: Payer: Self-pay | Admitting: Family Medicine

## 2019-08-24 NOTE — Progress Notes (Signed)
HISTORY AND PHYSICAL     CC:  follow up. Requesting Provider:  Gwenlyn Fudge, FNP  HPI: This is a 59 y.o. male who is here today for follow up.  He has hx of bilateral CIA stents by Dr. Myra Gianotti 08/22/2011.  He denies any claudication, rest pain, or nonhealing wounds of bilateral lower extremities.  He is taking aspirin, statin, and Plavix daily.  He continues to smoke 1 to 2 cigarettes/day.  He was having hx of abdominal pain and his pain was not consistent with mesenteric ischemia and had gained some weight.  He was recently in the ER for abdominal pain, which is chronic.  He denies any postprandial pain.  The pt is on a statin for cholesterol management.    The pt is on an aspirin.    Other AC:  Plavix The pt is not on medication for hypertension.  The pt does not have diabetes. Tobacco hx:  current   Past Medical History:  Diagnosis Date  . Anorexia 11/29/2010  . Arthritis   . Chest pain   . Decreased vision   . Depression   . Dyspnea on exertion   . Generalized headaches   . High cholesterol 01/30/2017  . Hyperlipidemia   . Hypertension   . Peripheral vascular disease (HCC)   . Stroke (HCC)   . Urinary frequency     Past Surgical History:  Procedure Laterality Date  . ABDOMINAL AORTAGRAM N/A 08/22/2011   Procedure: ABDOMINAL Ronny Flurry;  Surgeon: Nada Libman, MD;  [facility] C[facility] LAB;  Service: Cardiovascular;  Laterality: N/A;  . aortogram    . COLONOSCOPY N/A 06/07/2017   Procedure: COLONOSCOPY;  Surgeon: Malissa Hippo, MD;  Location: [facility] ENDO SUITE;  Service: Endoscopy;  Laterality: N/A;  150  . PERCUTANEOUS STENT INTERVENTION Bilateral 08/22/2011   Procedure: PERCUTANEOUS STENT INTERVENTION;  Surgeon: Nada Libman, MD;  Location: [facility] CATH LAB;  Service: Cardiovascular;  Laterality: Bilateral;  . Stents  August 22, 2011   Bilateral iliac PTA and stenting    No Known Allergies  Current Outpatient Medications  Medication Sig Dispense Refill  . aspirin  EC 325 MG tablet Take 325 mg by mouth daily.    Marland Kitchen atorvastatin (LIPITOR) 80 MG tablet Take 1 tablet (80 mg total) by mouth daily at 6 PM. 90 tablet 3  . cilostazol (PLETAL) 100 MG tablet Take 100 mg by mouth 2 (two) times daily.    . clopidogrel (PLAVIX) 75 MG tablet Take 1 tablet (75 mg total) by mouth daily. 90 tablet 1  . dicyclomine (BENTYL) 10 MG capsule Take 1 capsule (10 mg total) by mouth 3 (three) times daily as needed for spasms. 90 capsule 2  . meloxicam (MOBIC) 7.5 MG tablet Take 1 tablet (7.5 mg total) by mouth daily. 90 tablet 1  . pantoprazole (PROTONIX) 40 MG tablet Take 1 tablet (40 mg total) by mouth daily. 90 tablet 3  . potassium chloride (KLOR-CON) 10 MEQ tablet Take 2 tablets (20 mEq total) by mouth 2 (two) times daily. 360 tablet 3   No current facility-administered medications for this visit.    Family History  Problem Relation Age of Onset  . Heart disease Mother   . Diabetes Father   . Colon cancer Father     Social History   Socioeconomic History  . Marital status: Single    Spouse name: Not on file  . Number of children: Not on file  . Years of education: Not  on file  . Highest education level: Not on file  Occupational History  . Not on file  Tobacco Use  . Smoking status: Former Smoker    Packs/day: 1.00    Years: 40.00    Pack years: 40.00    Types: Cigarettes    Quit date: 08/04/2012    Years since quitting: 7.0  . Smokeless tobacco: Never Used  . Tobacco comment: quit smoking 5 yrs ago. smoked one cigarette yesterday (09/25/2018)  Substance and Sexual Activity  . Alcohol use: Yes    Comment: wine every other day; 2-3 a day on 02/20/2019  . Drug use: Yes    Types: Marijuana  . Sexual activity: Not on file  Other Topics Concern  . Not on file  Social History Narrative  . Not on file   Social Determinants of Health   Financial Resource Strain:   . Difficulty of Paying Living Expenses:   Food Insecurity:   . Worried About Brewing technologist in the Last Year:   . Barista in the Last Year:   Transportation Needs:   . Freight forwarder (Medical):   Marland Kitchen Lack of Transportation (Non-Medical):   Physical Activity:   . Days of Exercise per Week:   . Minutes of Exercise per Session:   Stress:   . Feeling of Stress :   Social Connections:   . Frequency of Communication with Friends and Family:   . Frequency of Social Gatherings with Friends and Family:   . Attends Religious Services:   . Active Member of Clubs or Organizations:   . Attends Banker Meetings:   Marland Kitchen Marital Status:   Intimate Partner Violence:   . Fear of Current or Ex-Partner:   . Emotionally Abused:   Marland Kitchen Physically Abused:   . Sexually Abused:      REVIEW OF SYSTEMS:   [X]  denotes positive finding, [ ]  denotes negative finding Cardiac  Comments:  Chest pain or chest pressure:    Shortness of breath upon exertion:    Short of breath when lying flat:    Irregular heart rhythm:        Vascular    Pain in calf, thigh, or hip brought on by ambulation:    Pain in feet at night that wakes you up from your sleep:     Blood clot in your veins:    Leg swelling:         Pulmonary    Oxygen at home:    Productive cough:     Wheezing:         Neurologic    Sudden weakness in arms or legs:     Sudden numbness in arms or legs:     Sudden onset of difficulty speaking or slurred speech:    Temporary loss of vision in one eye:     Problems with dizziness:         Gastrointestinal    Blood in stool:     Vomited blood:         Genitourinary    Burning when urinating:     Blood in urine:        Psychiatric    Major depression:         Hematologic    Bleeding problems:    Problems with blood clotting too easily:        Skin    Rashes or ulcers:        Constitutional  Fever or chills:      PHYSICAL EXAMINATION:  Today's Vitals   08/25/19 0911  BP: (!) 175/80  Pulse: (!) 55  Resp: 20  Temp: (!) 97.3 F (36.3 C)    SpO2: 100%  Weight: 150 lb 12.8 oz (68.4 kg)  Height: 5\' 4"  (1.626 m)   Body mass index is 25.88 kg/m.   General:  WDWN in NAD; vital signs documented above Gait: Not observed HENT: WNL, normocephalic Pulmonary: normal non-labored breathing , without Rales, rhonchi,  wheezing Cardiac: regular HR Abdomen: soft, NT, no masses Skin: without rashes Vascular Exam/Pulses:  Right Left  Radial 2+ (normal) 2+ (normal)  DP 2+ (normal) 1+ (weak)  PT 1+ (weak) 2+ (normal)   Extremities: without ischemic changes, without Gangrene , without cellulitis; without open wounds;  Musculoskeletal: no muscle wasting or atrophy  Neurologic: A&O X 3;  No focal weakness or paresthesias are detected Psychiatric:  The pt has Normal affect.   Non-Invasive Vascular Imaging:   ABI's/TBI's on 08/25/2019: Right:  0.86 - Great toe pressure: 0.76 Left:  0.95 - Great toe pressure: 0.8   Arterial duplex on 08/25/2019: Patent bilateral common iliac artery stents Right EIA stenosis 280 cm/s mid EIA Left EIA stenosis 204 cm/s distal EIA    ASSESSMENT/PLAN:: 59 y.o. male here for follow up for hx of bilateral CIA stents  -Bilateral common iliac artery stents are patent -Bilateral external iliac artery stenosis however patient with palpable pedal pulses and no symptoms of claudication -Continue aspirin, Plavix, and statin daily -Recheck aortoiliac duplex and ABIs in 1 year -Encouraged smoking cessation -Call/return office sooner if claudication, rest pain, or nonhealing wounds develop   Dagoberto Ligas, PA-C [facility] [phone]  Clinic MD:   Oneida Alar

## 2019-08-25 ENCOUNTER — Other Ambulatory Visit: Payer: Self-pay | Admitting: Family Medicine

## 2019-08-25 ENCOUNTER — Ambulatory Visit (INDEPENDENT_AMBULATORY_CARE_PROVIDER_SITE_OTHER)
Admission: RE | Admit: 2019-08-25 | Discharge: 2019-08-25 | Disposition: A | Discharge status: Home / Self Care | Payer: Medicare Other | Source: Ambulatory Visit | Attending: Surgery | Admitting: Surgery

## 2019-08-25 ENCOUNTER — Other Ambulatory Visit: Payer: Self-pay

## 2019-08-25 ENCOUNTER — Ambulatory Visit (HOSPITAL_COMMUNITY)
Admission: RE | Admit: 2019-08-25 | Discharge: 2019-08-25 | Disposition: A | Discharge status: Home / Self Care | Payer: Medicare Other | Source: Ambulatory Visit | Attending: Surgery | Admitting: Surgery

## 2019-08-25 ENCOUNTER — Ambulatory Visit (INDEPENDENT_AMBULATORY_CARE_PROVIDER_SITE_OTHER): Payer: Medicare Other | Admitting: Physician Assistant

## 2019-08-25 VITALS — BP 175/80 | HR 55 | Temp 97.3°F | Resp 20 | Ht 64.0 in | Wt 150.8 lb

## 2019-08-25 DIAGNOSIS — I739 Peripheral vascular disease, unspecified: Secondary | ICD-10-CM | POA: Diagnosis not present

## 2019-08-25 DIAGNOSIS — I70213 Atherosclerosis of native arteries of extremities with intermittent claudication, bilateral legs: Secondary | ICD-10-CM | POA: Diagnosis not present

## 2019-08-25 DIAGNOSIS — I771 Stricture of artery: Secondary | ICD-10-CM | POA: Diagnosis not present

## 2019-08-26 ENCOUNTER — Other Ambulatory Visit: Payer: Self-pay | Admitting: *Deleted

## 2019-08-26 DIAGNOSIS — I739 Peripheral vascular disease, unspecified: Secondary | ICD-10-CM

## 2019-08-26 DIAGNOSIS — I771 Stricture of artery: Secondary | ICD-10-CM

## 2019-08-26 DIAGNOSIS — Z95828 Presence of other vascular implants and grafts: Secondary | ICD-10-CM

## 2019-09-22 NOTE — Progress Notes (Signed)
Assessment & Plan:  1. Essential hypertension - Well controlled without medication. Encouraged diet and exercise to keep control.   2-3. Generalized anxiety disorder/Mild episode of recurrent major depressive disorder ([place]) - Started patient on Celexa 20 mg QD today.    Return in about 6 weeks (around 11/04/2019) for anxiety/depression (telephone).  Hendricks Limes, MSN, APRN, FNP-C [facility]  Subjective:    [name], male    DOB: 1961/04/16, 59 y.o.   MRN: [id number]  Patient Care Team: Loman Brooklyn, FNP as PCP - [facility], [facility] Francella Solian, MD as Referring Physician (Cardiology)   Chief Complaint:  Chief Complaint  Patient presents with  . Hypertension    4 week follow up    HPI: Cameron Ware is a 59 y.o. male presenting on 09/23/2019 for Hypertension (4 week follow up)  Patient is here for follow-up of hypertension.  He was last seen about a month ago at which time his blood pressure was elevated.  He was given education on the DASH diet and encouraged to make lifestyle changes.  I asked him to keep a log of his blood pressures to bring back with him to his next appointment and he has done so; his blood pressure has been good at home.    New complaints: Patient is concerned about some anxiety and depression.   Depression screen [facility] 2/9 09/23/2019 08/20/2019 02/20/2019  Decreased Interest 2 0 0  Down, Depressed, Hopeless 2 0 0  PHQ - 2 Score 4 0 0  Altered sleeping 0 - -  Tired, decreased energy 2 - -  Change in appetite 0 - -  Feeling bad or failure about yourself  2 - -  Trouble concentrating 2 - -  Moving slowly or fidgety/restless 2 - -  Suicidal thoughts 0 - -  PHQ-9 Score 12 - -   GAD 7 : Generalized Anxiety Score 09/23/2019  Nervous, Anxious, on Edge 3  Control/stop worrying 3  Worry too much - different things 3  Trouble relaxing 2  Restless 2  Easily  annoyed or irritable 3  Afraid - awful might happen 3  Total GAD 7 Score 19    Social history:  Relevant past medical, surgical, family and social history reviewed and updated as indicated. Interim medical history since our last visit reviewed.  Allergies and medications reviewed and updated.  DATA REVIEWED: CHART IN EPIC  ROS: Negative unless specifically indicated above in HPI.    Current Outpatient Medications:  .  aspirin EC 325 MG tablet, Take 325 mg by mouth daily., Disp: , Rfl:  .  atorvastatin (LIPITOR) 80 MG tablet, Take 1 tablet (80 mg total) by mouth daily at 6 PM., Disp: 90 tablet, Rfl: 3 .  clopidogrel (PLAVIX) 75 MG tablet, TAKE 1 TABLET ONCE A DAY, Disp: 90 tablet, Rfl: 0 .  dicyclomine (BENTYL) 10 MG capsule, Take 1 capsule (10 mg total) by mouth 3 (three) times daily as needed for spasms., Disp: 90 capsule, Rfl: 2 .  meloxicam (MOBIC) 7.5 MG tablet, Take 1 tablet (7.5 mg total) by mouth daily., Disp: 90 tablet, Rfl: 0 .  pantoprazole (PROTONIX) 40 MG tablet, Take 1 tablet (40 mg total) by mouth daily., Disp: 90 tablet, Rfl: 3 .  potassium chloride (KLOR-CON) 10 MEQ tablet, Take 2 tablets (20 mEq total) by mouth 2 (two) times daily., Disp: 360 tablet, Rfl: 3 .  citalopram (CELEXA) 20 MG tablet, Take  1 tablet (20 mg total) by mouth daily., Disp: 30 tablet, Rfl: 2   No Known Allergies Past Medical History:  Diagnosis Date  . Anorexia 11/29/2010  . Arthritis   . Chest pain   . Decreased vision   . Depression   . Dyspnea on exertion   . Generalized headaches   . High cholesterol 01/30/2017  . Hyperlipidemia   . Hypertension   . Peripheral vascular disease (HCC)   . Stroke (HCC)   . Urinary frequency     Past Surgical History:  Procedure Laterality Date  . ABDOMINAL AORTAGRAM N/A 08/22/2011   Procedure: ABDOMINAL Ronny Flurry;  Surgeon: Nada Libman, MD;  Location: [facility] CATH LAB;  Service: Cardiovascular;  Laterality: N/A;  . aortogram    . COLONOSCOPY N/A  06/07/2017   Procedure: COLONOSCOPY;  Surgeon: Malissa Hippo, MD;  [facility];  Service: Endoscopy;  Laterality: N/A;  150  . PERCUTANEOUS STENT INTERVENTION Bilateral 08/22/2011   Procedure: PERCUTANEOUS STENT INTERVENTION;  Surgeon: Nada Libman, MD;  Location: [facility] CATH LAB;  Service: Cardiovascular;  Laterality: Bilateral;  . Stents  August 22, 2011   Bilateral iliac PTA and stenting    Social History   Socioeconomic History  . Marital status: Single    Spouse name: Not on file  . Number of children: Not on file  . Years of education: Not on file  . Highest education level: Not on file  Occupational History  . Not on file  Tobacco Use  . Smoking status: Former Smoker    Packs/day: 1.00    Years: 40.00    Pack years: 40.00    Types: Cigarettes    Quit date: 08/04/2012    Years since quitting: 7.1  . Smokeless tobacco: Never Used  . Tobacco comment: quit smoking 5 yrs ago. smoked one cigarette yesterday (09/25/2018)  Substance and Sexual Activity  . Alcohol use: Yes    Comment: wine every other day; 2-3 a day on 02/20/2019  . Drug use: Yes    Types: Marijuana  . Sexual activity: Not on file  Other Topics Concern  . Not on file  Social History Narrative  . Not on file   Social Determinants of Health   Financial Resource Strain:   . Difficulty of Paying Living Expenses:   Food Insecurity:   . Worried About Programme researcher, broadcasting/film/video in the Last Year:   . Barista in the Last Year:   Transportation Needs:   . Freight forwarder (Medical):   Marland Kitchen Lack of Transportation (Non-Medical):   Physical Activity:   . Days of Exercise per Week:   . Minutes of Exercise per Session:   Stress:   . Feeling of Stress :   Social Connections:   . Frequency of Communication with Friends and Family:   . Frequency of Social Gatherings with Friends and Family:   . Attends Religious Services:   . Active Member of Clubs or Organizations:   . Attends Banker  Meetings:   Marland Kitchen Marital Status:   Intimate Partner Violence:   . Fear of Current or Ex-Partner:   . Emotionally Abused:   Marland Kitchen Physically Abused:   . Sexually Abused:         Objective:    BP 135/73   Pulse 69   Temp (!) 97.5 F (36.4 C) (Temporal)   Ht 5\' 4"  (1.626 m)   Wt 153 lb (69.4 kg)   SpO2  98%   BMI 26.26 kg/m   Wt Readings from Last 3 Encounters:  09/23/19 153 lb (69.4 kg)  08/25/19 150 lb 12.8 oz (68.4 kg)  08/20/19 152 lb 6.4 oz (69.1 kg)    Physical Exam Vitals reviewed.  Constitutional:      General: He is not in acute distress.    Appearance: Normal appearance. He is overweight. He is not ill-appearing, toxic-appearing or diaphoretic.  HENT:     Head: Normocephalic and atraumatic.  Eyes:     General: No scleral icterus.       Right eye: No discharge.        Left eye: No discharge.     Conjunctiva/sclera: Conjunctivae normal.  Cardiovascular:     Rate and Rhythm: Normal rate and regular rhythm.     Heart sounds: Normal heart sounds. No murmur. No friction rub. No gallop.   Pulmonary:     Effort: Pulmonary effort is normal. No respiratory distress.     Breath sounds: Normal breath sounds. No stridor. No wheezing, rhonchi or rales.  Musculoskeletal:        General: Normal range of motion.     Cervical back: Normal range of motion.  Skin:    General: Skin is warm and dry.  Neurological:     Mental Status: He is alert and oriented to person, place, and time. Mental status is at baseline.  Psychiatric:        Mood and Affect: Mood normal.        Behavior: Behavior normal.        Thought Content: Thought content normal.        Judgment: Judgment normal.     No results found for: TSH Lab Results  Component Value Date   WBC 7.1 08/20/2019   HGB 16.7 08/20/2019   HCT 48.2 08/20/2019   MCV 90 08/20/2019   PLT 304 08/20/2019   Lab Results  Component Value Date   NA 136 08/20/2019   K 4.5 08/20/2019   CO2 23 08/20/2019   GLUCOSE 88 08/20/2019    BUN 9 08/20/2019   CREATININE 1.02 08/20/2019   BILITOT 0.5 08/20/2019   ALKPHOS 120 (H) 08/20/2019   AST 16 08/20/2019   ALT 17 08/20/2019   PROT 7.0 08/20/2019   ALBUMIN 4.6 08/20/2019   CALCIUM 10.2 08/20/2019   ANIONGAP 12 09/25/2018   Lab Results  Component Value Date   CHOL 132 08/20/2019   Lab Results  Component Value Date   HDL 49 08/20/2019   Lab Results  Component Value Date   LDLCALC 64 08/20/2019   Lab Results  Component Value Date   TRIG 106 08/20/2019   Lab Results  Component Value Date   CHOLHDL 2.7 08/20/2019   No results found for: HGBA1C

## 2019-09-23 ENCOUNTER — Ambulatory Visit (INDEPENDENT_AMBULATORY_CARE_PROVIDER_SITE_OTHER): Payer: Medicare Other | Admitting: Family Medicine

## 2019-09-23 ENCOUNTER — Other Ambulatory Visit: Payer: Self-pay

## 2019-09-23 ENCOUNTER — Encounter: Payer: Self-pay | Admitting: Family Medicine

## 2019-09-23 ENCOUNTER — Telehealth: Payer: Self-pay | Admitting: Family Medicine

## 2019-09-23 VITALS — BP 135/73 | HR 69 | Temp 97.5°F | Ht 64.0 in | Wt 153.0 lb

## 2019-09-23 DIAGNOSIS — F330 Major depressive disorder, recurrent, mild: Secondary | ICD-10-CM

## 2019-09-23 DIAGNOSIS — I10 Essential (primary) hypertension: Secondary | ICD-10-CM | POA: Diagnosis not present

## 2019-09-23 DIAGNOSIS — F411 Generalized anxiety disorder: Secondary | ICD-10-CM | POA: Diagnosis not present

## 2019-09-23 MED ORDER — CITALOPRAM HYDROBROMIDE 20 MG PO TABS: 20.0000 mg | ORAL_TABLET | Freq: Every day | ORAL | 2 refills | Status: DC

## 2019-09-23 NOTE — Telephone Encounter (Signed)
w

## 2019-10-05 ENCOUNTER — Encounter: Payer: Self-pay | Admitting: Family Medicine

## 2019-10-05 DIAGNOSIS — F411 Generalized anxiety disorder: Secondary | ICD-10-CM | POA: Insufficient documentation

## 2019-10-05 DIAGNOSIS — F330 Major depressive disorder, recurrent, mild: Secondary | ICD-10-CM | POA: Insufficient documentation

## 2019-11-04 ENCOUNTER — Ambulatory Visit (INDEPENDENT_AMBULATORY_CARE_PROVIDER_SITE_OTHER): Payer: Medicare Other | Admitting: Family Medicine

## 2019-11-04 ENCOUNTER — Encounter: Payer: Self-pay | Admitting: Family Medicine

## 2019-11-04 DIAGNOSIS — F411 Generalized anxiety disorder: Secondary | ICD-10-CM | POA: Diagnosis not present

## 2019-11-04 DIAGNOSIS — F330 Major depressive disorder, recurrent, mild: Secondary | ICD-10-CM | POA: Diagnosis not present

## 2019-11-04 MED ORDER — FLUOXETINE HCL 20 MG PO CAPS: 20.0000 mg | ORAL_CAPSULE | Freq: Every day | ORAL | 2 refills | Status: DC

## 2019-11-04 NOTE — Progress Notes (Signed)
Virtual Visit via Telephone Note  I connected with Cameron Ware on 11/04/19 at 9:05 AM by telephone and verified that I am speaking with the correct person using two identifiers. Cameron Ware is currently located at home and his mother is currently with him during this visit. The provider, Loman Brooklyn, FNP is located in their home at time of visit.  I discussed the limitations, risks, security and privacy concerns of performing an evaluation and management service by telephone and the availability of in person appointments. I also discussed with the patient that there may be a patient responsible charge related to this service. The patient expressed understanding and agreed to proceed.  Subjective: PCP: Loman Brooklyn, FNP  Chief Complaint  Patient presents with  . Depression  . Anxiety   Patient is following up on anxiety and depression. He was started on Celexa 20 mg QD ~6 weeks ago. He is not sure he can tell a difference with the medication. He reports he has failed a medication previously for depression with Dr. Edrick Oh but does not know what this medication was.   Depression screen [facility] 2/9 11/04/2019 09/23/2019 08/20/2019  Decreased Interest 1 2 0  Down, Depressed, Hopeless 2 2 0  PHQ - 2 Score 3 4 0  Altered sleeping 0 0 -  Tired, decreased energy 2 2 -  Change in appetite 0 0 -  Feeling bad or failure about yourself  3 2 -  Trouble concentrating 0 2 -  Moving slowly or fidgety/restless 0 2 -  Suicidal thoughts 2 0 -  PHQ-9 Score 10 12 -  Difficult doing work/chores Somewhat difficult - -   GAD 7 : Generalized Anxiety Score 11/04/2019 09/23/2019  Nervous, Anxious, on Edge 2 3  Control/stop worrying 3 3  Worry too much - different things 3 3  Trouble relaxing 3 2  Restless 2 2  Easily annoyed or irritable 2 3  Afraid - awful might happen 3 3  Total GAD 7 Score 18 19  Anxiety Difficulty Somewhat difficult -    ROS: Per HPI  Current Outpatient  Medications:  .  aspirin EC 325 MG tablet, Take 325 mg by mouth daily., Disp: , Rfl:  .  atorvastatin (LIPITOR) 80 MG tablet, Take 1 tablet (80 mg total) by mouth daily at 6 PM., Disp: 90 tablet, Rfl: 3 .  citalopram (CELEXA) 20 MG tablet, Take 1 tablet (20 mg total) by mouth daily., Disp: 30 tablet, Rfl: 2 .  clopidogrel (PLAVIX) 75 MG tablet, TAKE 1 TABLET ONCE A DAY, Disp: 90 tablet, Rfl: 0 .  dicyclomine (BENTYL) 10 MG capsule, Take 1 capsule (10 mg total) by mouth 3 (three) times daily as needed for spasms., Disp: 90 capsule, Rfl: 2 .  meloxicam (MOBIC) 7.5 MG tablet, Take 1 tablet (7.5 mg total) by mouth daily., Disp: 90 tablet, Rfl: 0 .  pantoprazole (PROTONIX) 40 MG tablet, Take 1 tablet (40 mg total) by mouth daily., Disp: 90 tablet, Rfl: 3 .  potassium chloride (KLOR-CON) 10 MEQ tablet, Take 2 tablets (20 mEq total) by mouth 2 (two) times daily., Disp: 360 tablet, Rfl: 3  No Known Allergies Past Medical History:  Diagnosis Date  . Anorexia 11/29/2010  . Arthritis   . Chest pain   . Decreased vision   . Depression   . Dyspnea on exertion   . Generalized headaches   . High cholesterol 01/30/2017  . Hyperlipidemia   . Hypertension   .  Peripheral vascular disease (HCC)   . Stroke (HCC)   . Urinary frequency     Observations/Objective: A&O  No respiratory distress or wheezing audible over the phone Mood, judgement, and thought processes all WNL   Assessment and Plan: 1-2. Mild episode of recurrent major depressive disorder (HCC)/Generalized anxiety disorder - Uncontrolled. D/C'd Celexa. Rx'd Prozac.  - FLUoxetine (PROZAC) 20 MG capsule; Take 1 capsule (20 mg total) by mouth daily.  Dispense: 30 capsule; Refill: 2   Follow Up Instructions: Return in about 6 weeks (around 12/16/2019) for anxiety and depression.  I discussed the assessment and treatment plan with the patient. The patient was provided an opportunity to ask questions and all were answered. The patient agreed  with the plan and demonstrated an understanding of the instructions.   The patient was advised to call back or seek an in-person evaluation if the symptoms worsen or if the condition fails to improve as anticipated.  The above assessment and management plan was discussed with the patient. The patient verbalized understanding of and has agreed to the management plan. Patient is aware to call the clinic if symptoms persist or worsen. Patient is aware when to return to the clinic for a follow-up visit. Patient educated on when it is appropriate to go to the emergency department.   Time call ended: 9:22 AM  I provided 17 minutes of non-face-to-face time during this encounter.  [name], MSN, APRN, FNP-C [facility] 11/04/19

## 2019-11-17 ENCOUNTER — Other Ambulatory Visit: Payer: Self-pay | Admitting: Family Medicine

## 2019-11-17 DIAGNOSIS — R1030 Lower abdominal pain, unspecified: Secondary | ICD-10-CM

## 2019-11-17 DIAGNOSIS — I739 Peripheral vascular disease, unspecified: Secondary | ICD-10-CM

## 2019-11-17 DIAGNOSIS — F330 Major depressive disorder, recurrent, mild: Secondary | ICD-10-CM

## 2019-11-17 DIAGNOSIS — F411 Generalized anxiety disorder: Secondary | ICD-10-CM

## 2019-12-04 ENCOUNTER — Other Ambulatory Visit: Payer: Self-pay | Admitting: Family Medicine

## 2019-12-04 DIAGNOSIS — F330 Major depressive disorder, recurrent, mild: Secondary | ICD-10-CM

## 2019-12-04 DIAGNOSIS — F411 Generalized anxiety disorder: Secondary | ICD-10-CM

## 2019-12-16 ENCOUNTER — Encounter: Payer: Self-pay | Admitting: Family Medicine

## 2019-12-16 ENCOUNTER — Ambulatory Visit: Payer: Medicare Other | Admitting: Family Medicine

## 2019-12-25 ENCOUNTER — Encounter: Payer: Self-pay | Admitting: Family Medicine

## 2019-12-25 ENCOUNTER — Other Ambulatory Visit: Payer: Self-pay

## 2019-12-25 ENCOUNTER — Ambulatory Visit (INDEPENDENT_AMBULATORY_CARE_PROVIDER_SITE_OTHER): Payer: Medicare Other | Admitting: Family Medicine

## 2019-12-25 VITALS — BP 124/69 | HR 57 | Temp 97.8°F | Ht 64.0 in | Wt 145.4 lb

## 2019-12-25 DIAGNOSIS — F411 Generalized anxiety disorder: Secondary | ICD-10-CM | POA: Diagnosis not present

## 2019-12-25 DIAGNOSIS — F330 Major depressive disorder, recurrent, mild: Secondary | ICD-10-CM

## 2019-12-25 NOTE — Progress Notes (Signed)
Assessment & Plan:  1. Generalized anxiety disorder - Well controlled on current regimen.   2. Mild episode of recurrent major depressive disorder (HCC) - Well controlled on current regimen.    Return for annual physical on/after 08/19/20.  [name], MSN, APRN, FNP-C [facility]  Subjective:    [name], male    DOB: November 05, 1960, 59 y.o.   MRN: [id number]  Patient Care Team: Gwenlyn Fudge, FNP as PCP - [facility], [facility] Jodie Echevaria Osie Cheeks, MD as Referring Physician (Cardiology)   Chief Complaint:  Chief Complaint  Patient presents with  . Depression    6 week follow up.  Patient states that it is better.  Marland Kitchen Anxiety    HPI: Cameron Ware is a 59 y.o. male presenting on 12/25/2019 for Depression (6 week follow up.  Patient states that it is better.) and Anxiety  Patient was started on Prozac approximately 6 weeks ago after discontinuing Celexa.  Today he reports he is feeling better with this medication.  He does not desire an increase in dosage.  Depression screen [facility] 2/9 12/25/2019 11/04/2019 09/23/2019  Decreased Interest 1 1 2   Down, Depressed, Hopeless 2 2 2   PHQ - 2 Score 3 3 4   Altered sleeping 0 0 0  Tired, decreased energy 0 2 2  Change in appetite 2 0 0  Feeling bad or failure about yourself  2 3 2   Trouble concentrating 0 0 2  Moving slowly or fidgety/restless 0 0 2  Suicidal thoughts 2 2 0  PHQ-9 Score 9 10 12   Difficult doing work/chores Somewhat difficult Somewhat difficult -   GAD 7 : Generalized Anxiety Score 12/25/2019 11/04/2019 09/23/2019  Nervous, Anxious, on Edge 2 2 3   Control/stop worrying 3 3 3   Worry too much - different things 2 3 3   Trouble relaxing 0 3 2  Restless 2 2 2   Easily annoyed or irritable 2 2 3   Afraid - awful might happen 2 3 3   Total GAD 7 Score 13 18 19   Anxiety Difficulty Somewhat difficult Somewhat difficult -    New  complaints: None  Social history:  Relevant past medical, surgical, family and social history reviewed and updated as indicated. Interim medical history since our last visit reviewed.  Allergies and medications reviewed and updated.  DATA REVIEWED: CHART IN EPIC  ROS: Negative unless specifically indicated above in HPI.    Current Outpatient Medications:  .  aspirin EC 325 MG tablet, Take 325 mg by mouth daily., Disp: , Rfl:  .  atorvastatin (LIPITOR) 80 MG tablet, Take 1 tablet (80 mg total) by mouth daily at 6 PM., Disp: 90 tablet, Rfl: 3 .  clopidogrel (PLAVIX) 75 MG tablet, TAKE 1 TABLET ONCE A DAY, Disp: 90 tablet, Rfl: 0 .  dicyclomine (BENTYL) 10 MG capsule, TAKE 1 CAPSULE 3 TIMES A DAY FOR SPASMS, Disp: 90 capsule, Rfl: 5 .  FLUoxetine (PROZAC) 20 MG capsule, Take 1 capsule (20 mg total) by mouth daily., Disp: 30 capsule, Rfl: 2 .  meloxicam (MOBIC) 7.5 MG tablet, TAKE 1 TABLET ONCE DAILY, Disp: 90 tablet, Rfl: 1 .  pantoprazole (PROTONIX) 40 MG tablet, Take 1 tablet (40 mg total) by mouth daily., Disp: 90 tablet, Rfl: 3 .  potassium chloride (KLOR-CON) 10 MEQ tablet, Take 2 tablets (20 mEq total) by mouth 2 (two) times daily., Disp: 360 tablet, Rfl: 3   No Known Allergies Past Medical History:  Diagnosis Date  . Anorexia 11/29/2010  . Arthritis   . Chest pain   . Decreased vision   . Depression   . Dyspnea on exertion   . Generalized headaches   . High cholesterol 01/30/2017  . Hyperlipidemia   . Hypertension   . Peripheral vascular disease (HCC)   . Stroke (HCC)   . Urinary frequency     Past Surgical History:  Procedure Laterality Date  . ABDOMINAL AORTAGRAM N/A 08/22/2011   Procedure: ABDOMINAL Ronny Flurry;  Surgeon: Nada Libman, MD;  Location: [facility] CATH LAB;  Service: Cardiovascular;  Laterality: N/A;  . aortogram    . COLONOSCOPY N/A 06/07/2017   Procedure: COLONOSCOPY;  Surgeon: Malissa Hippo, MD;  [facility];  Service: Endoscopy;  Laterality:  N/A;  150  . PERCUTANEOUS STENT INTERVENTION Bilateral 08/22/2011   Procedure: PERCUTANEOUS STENT INTERVENTION;  Surgeon: Nada Libman, MD;  Location[facility] CATH LAB;  Service: Cardiovascular;  Laterality: Bilateral;  . Stents  August 22, 2011   Bilateral iliac PTA and stenting    Social History   Socioeconomic History  . Marital status: Single    Spouse name: Not on file  . Number of children: Not on file  . Years of education: Not on file  . Highest education level: Not on file  Occupational History  . Not on file  Tobacco Use  . Smoking status: Former Smoker    Packs/day: 1.00    Years: 40.00    Pack years: 40.00    Types: Cigarettes    Quit date: 08/04/2012    Years since quitting: 7.3  . Smokeless tobacco: Never Used  . Tobacco comment: quit smoking 5 yrs ago. smoked one cigarette yesterday (09/25/2018)  Vaping Use  . Vaping Use: Former  Substance and Sexual Activity  . Alcohol use: Yes    Comment: wine every other day; 2-3 a day on 02/20/2019  . Drug use: Yes    Types: Marijuana  . Sexual activity: Not on file  Other Topics Concern  . Not on file  Social History Narrative  . Not on file   Social Determinants of Health   Financial Resource Strain:   . Difficulty of Paying Living Expenses:   Food Insecurity:   . Worried About Programme researcher, broadcasting/film/video in the Last Year:   . Barista in the Last Year:   Transportation Needs:   . Freight forwarder (Medical):   Marland Kitchen Lack of Transportation (Non-Medical):   Physical Activity:   . Days of Exercise per Week:   . Minutes of Exercise per Session:   Stress:   . Feeling of Stress :   Social Connections:   . Frequency of Communication with Friends and Family:   . Frequency of Social Gatherings with Friends and Family:   . Attends Religious Services:   . Active Member of Clubs or Organizations:   . Attends Banker Meetings:   Marland Kitchen Marital Status:   Intimate Partner Violence:   . Fear of Current or  Ex-Partner:   . Emotionally Abused:   Marland Kitchen Physically Abused:   . Sexually Abused:         Objective:    BP 124/69   Pulse 57   Temp 97.8 F (36.6 C) (Temporal)   Ht 5\' 4"  (1.626 m)   Wt 145 lb 6.4 oz (66 kg)   SpO2 99%   BMI 24.96 kg/m   Wt Readings from Last 3  Encounters:  12/25/19 145 lb 6.4 oz (66 kg)  09/23/19 153 lb (69.4 kg)  08/25/19 150 lb 12.8 oz (68.4 kg)    Physical Exam Vitals reviewed.  Constitutional:      General: He is not in acute distress.    Appearance: Normal appearance. He is normal weight. He is not ill-appearing, toxic-appearing or diaphoretic.  HENT:     Head: Normocephalic and atraumatic.  Eyes:     General: No scleral icterus.       Right eye: No discharge.        Left eye: No discharge.     Conjunctiva/sclera: Conjunctivae normal.  Cardiovascular:     Rate and Rhythm: Normal rate and regular rhythm.     Heart sounds: Normal heart sounds. No murmur heard.  No friction rub. No gallop.   Pulmonary:     Effort: Pulmonary effort is normal. No respiratory distress.     Breath sounds: Normal breath sounds. No stridor. No wheezing, rhonchi or rales.  Musculoskeletal:        General: Normal range of motion.     Cervical back: Normal range of motion.  Skin:    General: Skin is warm and dry.  Neurological:     Mental Status: He is alert and oriented to person, place, and time. Mental status is at baseline.  Psychiatric:        Mood and Affect: Mood normal.        Behavior: Behavior normal.        Thought Content: Thought content normal.        Judgment: Judgment normal.     No results found for: TSH Lab Results  Component Value Date   WBC 7.1 08/20/2019   HGB 16.7 08/20/2019   HCT 48.2 08/20/2019   MCV 90 08/20/2019   PLT 304 08/20/2019   Lab Results  Component Value Date   NA 136 08/20/2019   K 4.5 08/20/2019   CO2 23 08/20/2019   GLUCOSE 88 08/20/2019   BUN 9 08/20/2019   CREATININE 1.02 08/20/2019   BILITOT 0.5 08/20/2019    ALKPHOS 120 (H) 08/20/2019   AST 16 08/20/2019   ALT 17 08/20/2019   PROT 7.0 08/20/2019   ALBUMIN 4.6 08/20/2019   CALCIUM 10.2 08/20/2019   ANIONGAP 12 09/25/2018   Lab Results  Component Value Date   CHOL 132 08/20/2019   Lab Results  Component Value Date   HDL 49 08/20/2019   Lab Results  Component Value Date   LDLCALC 64 08/20/2019   Lab Results  Component Value Date   TRIG 106 08/20/2019   Lab Results  Component Value Date   CHOLHDL 2.7 08/20/2019   No results found for: HGBA1C

## 2019-12-29 ENCOUNTER — Encounter: Payer: Self-pay | Admitting: Family Medicine

## 2020-01-30 ENCOUNTER — Other Ambulatory Visit: Payer: Self-pay | Admitting: Family Medicine

## 2020-01-30 DIAGNOSIS — F411 Generalized anxiety disorder: Secondary | ICD-10-CM

## 2020-01-30 DIAGNOSIS — F330 Major depressive disorder, recurrent, mild: Secondary | ICD-10-CM

## 2020-02-16 ENCOUNTER — Other Ambulatory Visit: Payer: Self-pay | Admitting: Family Medicine

## 2020-02-16 DIAGNOSIS — I739 Peripheral vascular disease, unspecified: Secondary | ICD-10-CM

## 2020-05-17 ENCOUNTER — Other Ambulatory Visit: Payer: Self-pay | Admitting: Family Medicine

## 2020-06-16 ENCOUNTER — Other Ambulatory Visit: Payer: Self-pay | Admitting: Family Medicine

## 2020-06-16 DIAGNOSIS — F330 Major depressive disorder, recurrent, mild: Secondary | ICD-10-CM

## 2020-06-16 DIAGNOSIS — F411 Generalized anxiety disorder: Secondary | ICD-10-CM

## 2020-07-13 ENCOUNTER — Other Ambulatory Visit: Payer: Self-pay | Admitting: Family Medicine

## 2020-07-13 DIAGNOSIS — R1030 Lower abdominal pain, unspecified: Secondary | ICD-10-CM

## 2020-08-17 ENCOUNTER — Other Ambulatory Visit: Payer: Self-pay | Admitting: Family Medicine

## 2020-08-17 DIAGNOSIS — R1030 Lower abdominal pain, unspecified: Secondary | ICD-10-CM

## 2020-08-20 ENCOUNTER — Encounter: Payer: Medicare Other | Admitting: Family Medicine

## 2020-09-03 ENCOUNTER — Encounter: Payer: Medicare Other | Admitting: Family Medicine

## 2020-09-14 ENCOUNTER — Other Ambulatory Visit: Payer: Self-pay | Admitting: Family Medicine

## 2020-09-14 DIAGNOSIS — F411 Generalized anxiety disorder: Secondary | ICD-10-CM

## 2020-09-14 DIAGNOSIS — F330 Major depressive disorder, recurrent, mild: Secondary | ICD-10-CM

## 2020-09-16 ENCOUNTER — Encounter: Payer: Self-pay | Admitting: Family Medicine

## 2020-09-27 ENCOUNTER — Other Ambulatory Visit: Payer: Self-pay | Admitting: Family Medicine

## 2020-09-27 DIAGNOSIS — R1030 Lower abdominal pain, unspecified: Secondary | ICD-10-CM

## 2020-10-06 ENCOUNTER — Other Ambulatory Visit: Payer: Self-pay | Admitting: Family Medicine

## 2020-10-06 DIAGNOSIS — I739 Peripheral vascular disease, unspecified: Secondary | ICD-10-CM

## 2020-10-13 ENCOUNTER — Encounter: Payer: Self-pay | Admitting: Family Medicine

## 2020-10-13 ENCOUNTER — Ambulatory Visit (INDEPENDENT_AMBULATORY_CARE_PROVIDER_SITE_OTHER): Payer: Medicare Other | Admitting: Family Medicine

## 2020-10-13 ENCOUNTER — Other Ambulatory Visit: Payer: Self-pay

## 2020-10-13 VITALS — BP 134/70 | HR 57 | Temp 97.8°F | Ht 64.0 in | Wt 153.4 lb

## 2020-10-13 DIAGNOSIS — Z0001 Encounter for general adult medical examination with abnormal findings: Secondary | ICD-10-CM

## 2020-10-13 DIAGNOSIS — I739 Peripheral vascular disease, unspecified: Secondary | ICD-10-CM | POA: Diagnosis not present

## 2020-10-13 DIAGNOSIS — R1030 Lower abdominal pain, unspecified: Secondary | ICD-10-CM | POA: Diagnosis not present

## 2020-10-13 DIAGNOSIS — Z0000 Encounter for general adult medical examination without abnormal findings: Secondary | ICD-10-CM | POA: Diagnosis not present

## 2020-10-13 DIAGNOSIS — E7800 Pure hypercholesterolemia, unspecified: Secondary | ICD-10-CM | POA: Diagnosis not present

## 2020-10-13 DIAGNOSIS — I70213 Atherosclerosis of native arteries of extremities with intermittent claudication, bilateral legs: Secondary | ICD-10-CM | POA: Diagnosis not present

## 2020-10-13 DIAGNOSIS — F411 Generalized anxiety disorder: Secondary | ICD-10-CM

## 2020-10-13 DIAGNOSIS — R748 Abnormal levels of other serum enzymes: Secondary | ICD-10-CM | POA: Diagnosis not present

## 2020-10-13 DIAGNOSIS — I10 Essential (primary) hypertension: Secondary | ICD-10-CM

## 2020-10-13 DIAGNOSIS — K219 Gastro-esophageal reflux disease without esophagitis: Secondary | ICD-10-CM | POA: Diagnosis not present

## 2020-10-13 DIAGNOSIS — F330 Major depressive disorder, recurrent, mild: Secondary | ICD-10-CM | POA: Diagnosis not present

## 2020-10-13 MED ORDER — ATORVASTATIN CALCIUM 80 MG PO TABS: 80.0000 mg | ORAL_TABLET | Freq: Every day | ORAL | 1 refills | Status: DC

## 2020-10-13 MED ORDER — PANTOPRAZOLE SODIUM 40 MG PO TBEC: 40.0000 mg | DELAYED_RELEASE_TABLET | Freq: Every day | ORAL | 1 refills | Status: DC

## 2020-10-13 MED ORDER — FLUOXETINE HCL 20 MG PO CAPS: 20.0000 mg | ORAL_CAPSULE | Freq: Every day | ORAL | 1 refills | Status: DC

## 2020-10-13 MED ORDER — POTASSIUM CHLORIDE ER 10 MEQ PO TBCR: 20.0000 meq | EXTENDED_RELEASE_TABLET | Freq: Two times a day (BID) | ORAL | 1 refills | Status: DC

## 2020-10-13 NOTE — Progress Notes (Signed)
 Assessment & Plan:  1. Well adult exam Preventive health education provided.  Patient declined HIV screening, Shingrix, low-dose lung cancer screening CT, and AAA ultrasound. - CBC with Differential/Platelet - CMP14+EGFR - Lipid panel - PSA, total and free  2. Mild episode of recurrent major depressive disorder (HCC) Well controlled on current regimen.  - FLUoxetine (PROZAC) 20 MG capsule; Take 1 capsule (20 mg total) by mouth daily.  Dispense: 90 capsule; Refill: 1 - CMP14+EGFR  3. Generalized anxiety disorder Well controlled on current regimen.  - FLUoxetine (PROZAC) 20 MG capsule; Take 1 capsule (20 mg total) by mouth daily.  Dispense: 90 capsule; Refill: 1 - CMP14+EGFR  4. Essential hypertension Well controlled on current regimen.  - CBC with Differential/Platelet - CMP14+EGFR - Lipid panel  5. High cholesterol Previously controlled. Labs to assess. - atorvastatin (LIPITOR) 80 MG tablet; Take 1 tablet (80 mg total) by mouth daily at 6 PM.  Dispense: 90 tablet; Refill: 1 - CMP14+EGFR - Lipid panel  6. Gastroesophageal reflux disease, unspecified whether esophagitis present Well controlled on current regimen.  - pantoprazole (PROTONIX) 40 MG tablet; Take 1 tablet (40 mg total) by mouth daily.  Dispense: 90 tablet; Refill: 1 - CMP14+EGFR  7. Lower abdominal pain Again offered referral to physical medicine and rehabilitation, which patient declined.  8. Atherosclerosis of native artery of both lower extremities with intermittent claudication (HCC) On a statin.  Followed by vascular.  9. Peripheral vascular disease, unspecified (HCC) On a statin.  Followed by vascular.   Follow-up: Return in about 6 months (around 04/15/2021) for follow-up of chronic medication conditions.   Cameron Joyce, MSN, APRN, FNP-C [facility]  Subjective:  [name], male    DOB: 11/25/1960  Age: 59 y.o. MRN: [id number]  Patient Care  Team: Ware, Cameron F, FNP as PCP - General ([facility]) [facility], [facility], Walter Ang, MD as Referring Physician (Cardiology)   CC:  Chief Complaint  Patient presents with  . Hypertension  . Anxiety    3 month follow up of chronic medical conditions   . Abdominal Pain    Patient states he has been having ongoing lower abdominal pain    HPI Cameron Ware presents for his annual physical.  Occupation: Disability, Marital status: Single, Substance use: Marijuana Last colonoscopy: 06/07/2017 with repeat in 10 years Lung Cancer Screening with low-dose Chest CT: Declined AAA Screening: Declined Hepatitis C Screening: 08/11/2017 PSA: 2020 Immunizations: Flu Vaccine: not flu season Tdap Vaccine: up to date  Shingrix Vaccine: declined  COVID-19 Vaccine: up to date   Anxiety/Depression: does not desire a dose change.  Depression screen PHQ 2/9 10/13/2020 12/25/2019 11/04/2019  Decreased Interest 3 1 1  Down, Depressed, Hopeless 1 2 2  PHQ - 2 Score 4 3 3  Altered sleeping 0 0 0  Tired, decreased energy 3 0 2  Change in appetite 3 2 0  Feeling bad or failure about yourself  3 2 3  Trouble concentrating 0 0 0  Moving slowly or fidgety/restless 0 0 0  Suicidal thoughts 0 2 2  PHQ-9 Score 13 9 10  Difficult doing work/chores Not difficult at all Somewhat difficult Somewhat difficult   GAD 7 : Generalized Anxiety Score 10/13/2020 12/25/2019 11/04/2019 09/23/2019  Nervous, Anxious, on Edge 3 2 2 3  Control/stop worrying 3 3 3 3  Worry too much - different things 3 2 3 3  Trouble relaxing 1 0 3 2  Restless   _0 Easily annoyed or irritable _1 Afraid - awful might happen _2 Total GAD 7 Score _3 Anxiety Difficulty Not difficult at all Somewhat difficult Somewhat difficult -    Lower abdominal pain: Patient reports lower abdominal pain just above his pubic bone.  The pain is constant.  Describes it as 1 million goldfish nibbling on the  insides and squeezing.  States it is very frustrating and aggravating.  This has been going on for a long time.  States he was told in the past that he had a thin stomach from his smoking and drinking. No cause has been identified through either vascular or GI, CT scans or aortoiliac duplex.   I previously offered a referral to physical medicine and rehabilitation for conservative treatment/possible injections that he declined.  He has done pain management in the past but does not like taking pain medicines, so he quit going and quit taking the medication.  Review of Systems  Constitutional: Negative for chills, fever, malaise/fatigue and weight loss.  HENT: Negative for congestion, ear discharge, ear pain, nosebleeds, sinus pain, sore throat and tinnitus.   Eyes: Negative for blurred vision, double vision, pain, discharge and redness.  Respiratory: Negative for cough, shortness of breath and wheezing.   Cardiovascular: Negative for chest pain, palpitations and leg swelling.  Gastrointestinal: Positive for abdominal pain. Negative for constipation, diarrhea, heartburn, nausea and vomiting.  Genitourinary: Negative for dysuria, frequency and urgency.       Denies trouble initiating a urine stream, weak stream, split stream, nocturia, and dribbling.  Musculoskeletal: Negative for myalgias.  Skin: Negative for rash.  Neurological: Negative for dizziness, seizures, weakness and headaches.  Psychiatric/Behavioral: Negative for depression, substance abuse and suicidal ideas. The patient is not nervous/anxious.      Current Outpatient Medications:  .  aspirin EC 325 MG tablet, Take 325 mg by mouth daily., Disp: , Rfl:  .  atorvastatin (LIPITOR) 80 MG tablet, Take 1 tablet (80 mg total) by mouth daily at 6 PM., Disp: 90 tablet, Rfl: 3 .  clopidogrel (PLAVIX) 75 MG tablet, TAKE 1 TABLET ONCE A DAY, Disp: 90 tablet, Rfl: 0 .  dicyclomine (BENTYL) 10 MG capsule, TAKE 1 CAPSULE 3 TIMES A DAY FOR SPASMS,  Disp: 90 capsule, Rfl: 0 .  FLUoxetine (PROZAC) 20 MG capsule, Take 1 capsule (20 mg total) by mouth daily. (NEEDS TO BE SEEN BEFORE NEXT REFILL), Disp: 30 capsule, Rfl: 0 .  meloxicam (MOBIC) 7.5 MG tablet, TAKE 1 TABLET ONCE DAILY, Disp: 90 tablet, Rfl: 0 .  pantoprazole (PROTONIX) 40 MG tablet, Take 1 tablet (40 mg total) by mouth daily., Disp: 90 tablet, Rfl: 3 .  potassium chloride (KLOR-CON) 10 MEQ tablet, Take 2 tablets (20 mEq total) by mouth 2 (two) times daily., Disp: 360 tablet, Rfl: 3  No Known Allergies  Past Medical History:  Diagnosis Date  . Anorexia 11/29/2010  . Arthritis   . Chest pain   . Decreased vision   . Depression   . Dyspnea on exertion   . Generalized headaches   . High cholesterol 01/30/2017  . Hyperlipidemia   . Hypertension   . Peripheral vascular disease ([place])   . Stroke ([place])   . Urinary frequency     Past Surgical History:  Procedure Laterality Date  . ABDOMINAL AORTAGRAM N/A 08/22/2011   Procedure: ABDOMINAL Maxcine Ham;  Surgeon: Serafina Mitchell, MD;  Location: [facility] CATH  LAB;  Service: Cardiovascular;  Laterality: N/A;  . aortogram    . COLONOSCOPY N/A 06/07/2017   Procedure: COLONOSCOPY;  Surgeon: Rogene Houston, MD;  Location: [facility] ENDO SUITE;  Service: Endoscopy;  Laterality: N/A;  150  . PERCUTANEOUS STENT INTERVENTION Bilateral 08/22/2011   Procedure: PERCUTANEOUS STENT INTERVENTION;  Surgeon: Serafina Mitchell, MD;  Location: [facility] CATH LAB;  Service: Cardiovascular;  Laterality: Bilateral;  . Stents  August 22, 2011   Bilateral iliac PTA and stenting    Family History  Problem Relation Age of Onset  . Heart disease Mother   . Diabetes Father   . Colon cancer Father     Social History   Socioeconomic History  . Marital status: Single    Spouse name: Not on file  . Number of children: Not on file  . Years of education: Not on file  . Highest education level: Not on file  Occupational History  . Not on file  Tobacco Use  . Smoking  status: Former Smoker    Packs/day: 1.00    Years: 40.00    Pack years: 40.00    Types: Cigarettes    Quit date: 08/04/2012    Years since quitting: 8.1  . Smokeless tobacco: Never Used  . Tobacco comment: quit smoking 5 yrs ago. smoked one cigarette yesterday (09/25/2018)  Vaping Use  . Vaping Use: Former  Substance and Sexual Activity  . Alcohol use: Yes    Comment: wine every other day; 2-3 a day on 02/20/2019  . Drug use: Yes    Types: Marijuana  . Sexual activity: Not on file  Other Topics Concern  . Not on file  Social History Narrative  . Not on file   Social Determinants of Health   Financial Resource Strain: Not on file  Food Insecurity: Not on file  Transportation Needs: Not on file  Physical Activity: Not on file  Stress: Not on file  Social Connections: Not on file  Intimate Partner Violence: Not on file      Objective:    BP 134/70   Pulse (!) 57   Temp 97.8 F (36.6 C) (Temporal)   Ht 5' 4" (1.626 m)   Wt 153 lb 6.4 oz (69.6 kg)   SpO2 97%   BMI 26.33 kg/m   Wt Readings from Last 3 Encounters:  10/13/20 153 lb 6.4 oz (69.6 kg)  12/25/19 145 lb 6.4 oz (66 kg)  09/23/19 153 lb (69.4 kg)    Physical Exam Vitals reviewed.  Constitutional:      General: He is not in acute distress.    Appearance: Normal appearance. He is overweight. He is not ill-appearing, toxic-appearing or diaphoretic.  HENT:     Head: Normocephalic and atraumatic.     Right Ear: Tympanic membrane, ear canal and external ear normal. There is no impacted cerumen.     Left Ear: Tympanic membrane, ear canal and external ear normal. There is no impacted cerumen.     Nose: Nose normal. No congestion or rhinorrhea.     Mouth/Throat:     Mouth: Mucous membranes are moist.     Pharynx: Oropharynx is clear. No oropharyngeal exudate or posterior oropharyngeal erythema.  Eyes:     General: No scleral icterus.       Right eye: No discharge.        Left eye: No discharge.      Conjunctiva/sclera: Conjunctivae normal.     Pupils: Pupils are equal, round, and  reactive to light.  Neck:     Vascular: No carotid bruit.  Cardiovascular:     Rate and Rhythm: Normal rate and regular rhythm.     Heart sounds: Normal heart sounds. No murmur heard. No friction rub. No gallop.   Pulmonary:     Effort: Pulmonary effort is normal. No respiratory distress.     Breath sounds: Normal breath sounds. No stridor. No wheezing, rhonchi or rales.  Abdominal:     General: Abdomen is flat. Bowel sounds are normal. There is no distension.     Palpations: Abdomen is soft. There is no hepatomegaly, splenomegaly or mass.     Tenderness: There is abdominal tenderness in the suprapubic area. There is no guarding or rebound.     Hernia: No hernia is present.  Musculoskeletal:        General: Normal range of motion.     Cervical back: Normal range of motion and neck supple. No rigidity. No muscular tenderness.     Right lower leg: No edema.     Left lower leg: No edema.  Lymphadenopathy:     Cervical: No cervical adenopathy.  Skin:    General: Skin is warm and dry.     Capillary Refill: Capillary refill takes less than 2 seconds.  Neurological:     General: No focal deficit present.     Mental Status: He is alert and oriented to person, place, and time. Mental status is at baseline.  Psychiatric:        Mood and Affect: Mood normal.        Behavior: Behavior normal.        Thought Content: Thought content normal.        Judgment: Judgment normal.     No results found for: TSH Lab Results  Component Value Date   WBC 7.1 08/20/2019   HGB 16.7 08/20/2019   HCT 48.2 08/20/2019   MCV 90 08/20/2019   PLT 304 08/20/2019   Lab Results  Component Value Date   NA 136 08/20/2019   K 4.5 08/20/2019   CO2 23 08/20/2019   GLUCOSE 88 08/20/2019   BUN 9 08/20/2019   CREATININE 1.02 08/20/2019   BILITOT 0.5 08/20/2019   ALKPHOS 120 (H) 08/20/2019   AST 16 08/20/2019   ALT 17  08/20/2019   PROT 7.0 08/20/2019   ALBUMIN 4.6 08/20/2019   CALCIUM 10.2 08/20/2019   ANIONGAP 12 09/25/2018   Lab Results  Component Value Date   CHOL 132 08/20/2019   Lab Results  Component Value Date   HDL 49 08/20/2019   Lab Results  Component Value Date   LDLCALC 64 08/20/2019   Lab Results  Component Value Date   TRIG 106 08/20/2019   Lab Results  Component Value Date   CHOLHDL 2.7 08/20/2019   No results found for: HGBA1C      

## 2020-10-13 NOTE — Patient Instructions (Signed)

## 2020-10-14 LAB — LIPID PANEL
Chol/HDL Ratio: 3.1 ratio (ref 0.0–5.0)
Cholesterol, Total: 159 mg/dL (ref 100–199)
HDL: 52 mg/dL (ref >39)
LDL Chol Calc (NIH): 89 mg/dL (ref 0–99)
Triglycerides: 99 mg/dL (ref 0–149)
VLDL Cholesterol Cal: 18 mg/dL (ref 5–40)

## 2020-10-14 LAB — CBC WITH DIFFERENTIAL/PLATELET
Basophils Absolute: 0.1 10*3/uL (ref 0.0–0.2)
Basos: 1 %
EOS (ABSOLUTE): 0.1 10*3/uL (ref 0.0–0.4)
Eos: 2 %
Hematocrit: 44.9 % (ref 37.5–51.0)
Hemoglobin: 15.8 g/dL (ref 13.0–17.7)
Immature Grans (Abs): 0 10*3/uL (ref 0.0–0.1)
Immature Granulocytes: 0 %
Lymphocytes Absolute: 1.4 10*3/uL (ref 0.7–3.1)
Lymphs: 23 %
MCH: 30.6 pg (ref 26.6–33.0)
MCHC: 35.2 g/dL (ref 31.5–35.7)
MCV: 87 fL (ref 79–97)
Monocytes Absolute: 0.6 10*3/uL (ref 0.1–0.9)
Monocytes: 10 %
Neutrophils Absolute: 4 10*3/uL (ref 1.4–7.0)
Neutrophils: 64 %
Platelets: 259 10*3/uL (ref 150–450)
RBC: 5.17 x10E6/uL (ref 4.14–5.80)
RDW: 13.4 % (ref 11.6–15.4)
WBC: 6.2 10*3/uL (ref 3.4–10.8)

## 2020-10-14 LAB — CMP14+EGFR
ALT: 13 IU/L (ref 0–44)
AST: 20 IU/L (ref 0–40)
Albumin/Globulin Ratio: 1.7 (ref 1.2–2.2)
Albumin: 4.3 g/dL (ref 3.8–4.8)
Alkaline Phosphatase: 163 IU/L — ABNORMAL HIGH (ref 44–121)
BUN/Creatinine Ratio: 6 — ABNORMAL LOW (ref 10–24)
BUN: 5 mg/dL — ABNORMAL LOW (ref 8–27)
Bilirubin Total: 0.7 mg/dL (ref 0.0–1.2)
CO2: 24 mmol/L (ref 20–29)
Calcium: 9.5 mg/dL (ref 8.6–10.2)
Chloride: 99 mmol/L (ref 96–106)
Creatinine, Ser: 0.9 mg/dL (ref 0.76–1.27)
Globulin, Total: 2.5 g/dL (ref 1.5–4.5)
Glucose: 99 mg/dL (ref 65–99)
Potassium: 3.9 mmol/L (ref 3.5–5.2)
Sodium: 139 mmol/L (ref 134–144)
Total Protein: 6.8 g/dL (ref 6.0–8.5)
eGFR: 97 mL/min/{1.73_m2} (ref >59)

## 2020-10-14 LAB — PSA, TOTAL AND FREE
PSA, Free Pct: 18.7 %
PSA, Free: 0.28 ng/mL
Prostate Specific Ag, Serum: 1.5 ng/mL (ref 0.0–4.0)

## 2020-10-15 LAB — GAMMA GT: GGT: 27 IU/L (ref 0–65)

## 2020-10-15 LAB — NUCLEOTIDASE, 5', BLOOD: 5-Nucleotidase: 3 IU/L (ref 0–10)

## 2020-10-15 LAB — SPECIMEN STATUS REPORT

## 2020-10-18 ENCOUNTER — Other Ambulatory Visit: Payer: Self-pay | Admitting: Family Medicine

## 2020-10-18 DIAGNOSIS — R1030 Lower abdominal pain, unspecified: Secondary | ICD-10-CM

## 2020-10-18 LAB — TSH: TSH: 2.47 u[IU]/mL (ref 0.450–4.500)

## 2020-10-18 LAB — SPECIMEN STATUS REPORT

## 2020-10-18 LAB — VITAMIN D 25 HYDROXY (VIT D DEFICIENCY, FRACTURES): Vit D, 25-Hydroxy: 40.8 ng/mL (ref 30.0–100.0)

## 2020-10-29 ENCOUNTER — Encounter (HOSPITAL_COMMUNITY): Payer: Medicare Other

## 2020-10-29 ENCOUNTER — Ambulatory Visit: Payer: Medicare Other

## 2021-01-03 ENCOUNTER — Other Ambulatory Visit: Payer: Self-pay | Admitting: Family Medicine

## 2021-01-03 DIAGNOSIS — R1030 Lower abdominal pain, unspecified: Secondary | ICD-10-CM

## 2021-02-11 ENCOUNTER — Other Ambulatory Visit: Payer: Self-pay | Admitting: Family Medicine

## 2021-02-11 DIAGNOSIS — I739 Peripheral vascular disease, unspecified: Secondary | ICD-10-CM

## 2021-03-20 IMAGING — CT CT ABDOMEN AND PELVIS WITH CONTRAST
2 of 5 series · 15 of 46 positions shown, 17 images · IV contrast (Isovue)
Comparison: CT Chest, Abdomen, and Pelvis 12/04/2017 and earlier.

CLINICAL DATA: 57-year-old male with lower abdominal pain and
fever.

EXAM:
CT ABDOMEN AND PELVIS WITH CONTRAST
TECHNIQUE: Multidetector CT imaging of the abdomen and pelvis was performed
using the standard protocol following bolus administration of
intravenous contrast.
CONTRAST:  100mL OMNIPAQUE IOHEXOL 300 MG/ML  SOLN

[Series 2: axial st · axial · 0.73mm/px · z∈[+963,+1323]mm · 12 of 86 slices shown, 14 images]
[im 7/86  soft-tissue]
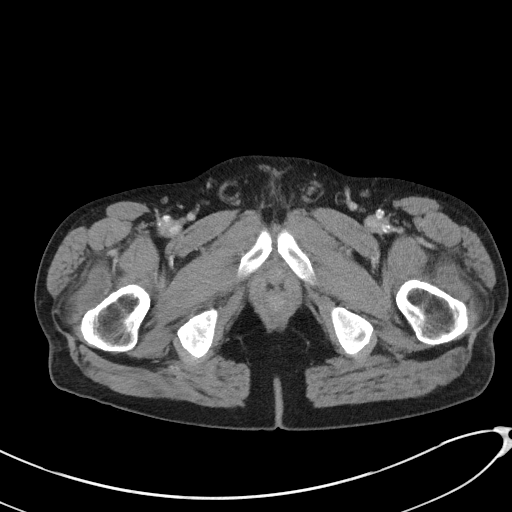
[im 7/86  bone]
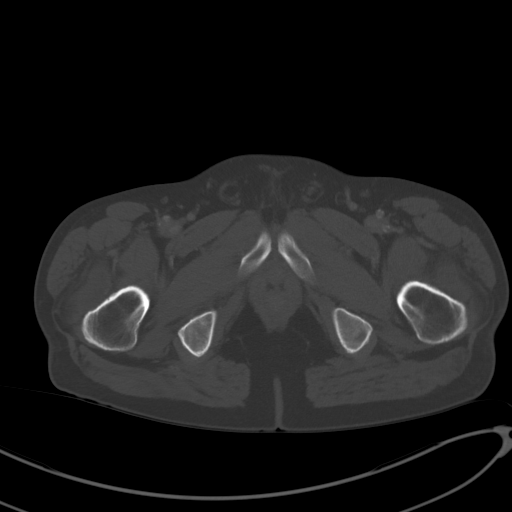
[im 14/86  soft-tissue]
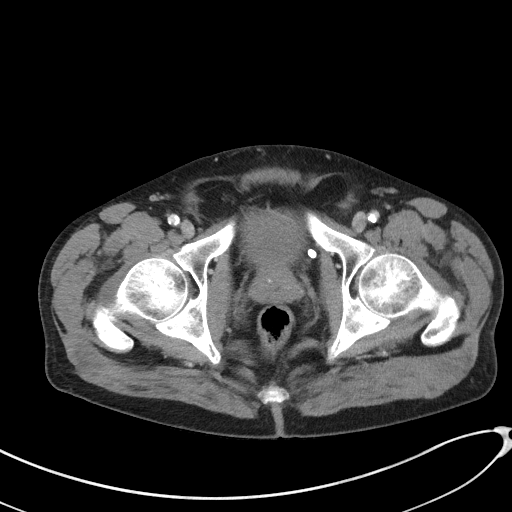
[im 20/86  soft-tissue]
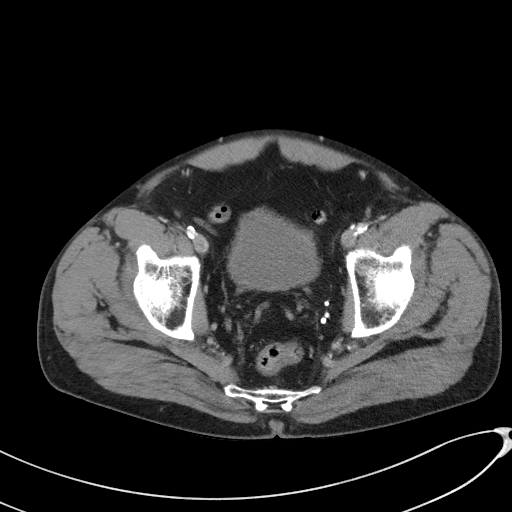
[im 27/86  soft-tissue]
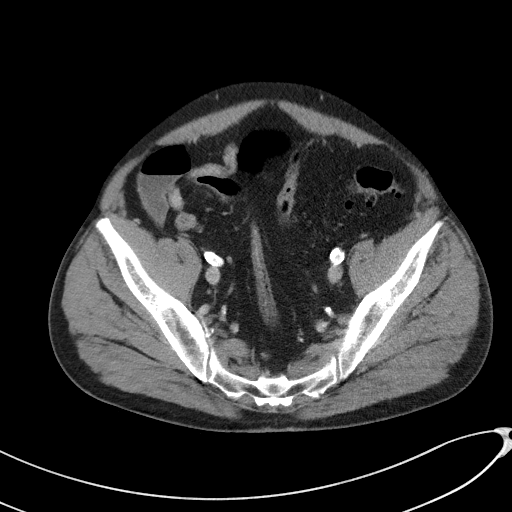
[im 33/86  soft-tissue]
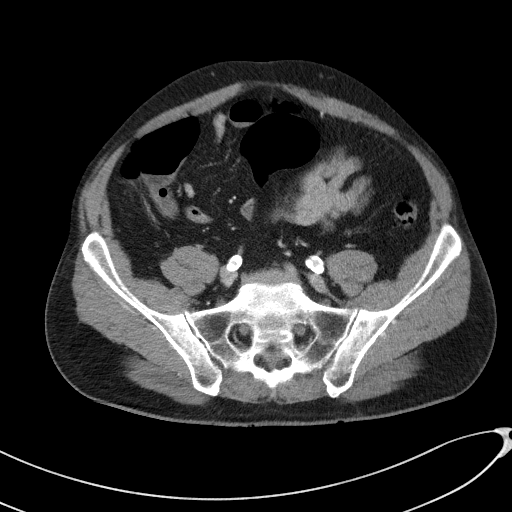
[im 40/86  soft-tissue]
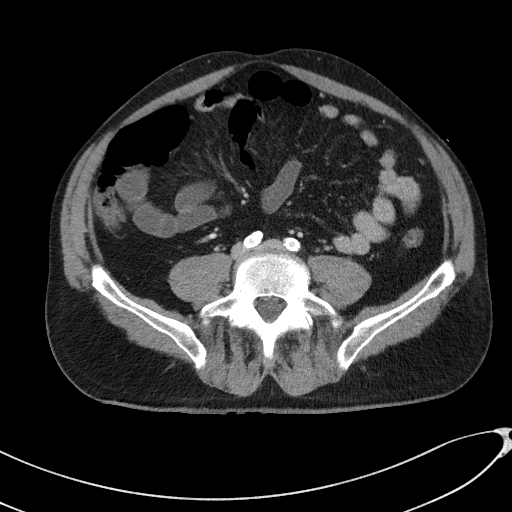
[im 46/86  soft-tissue]
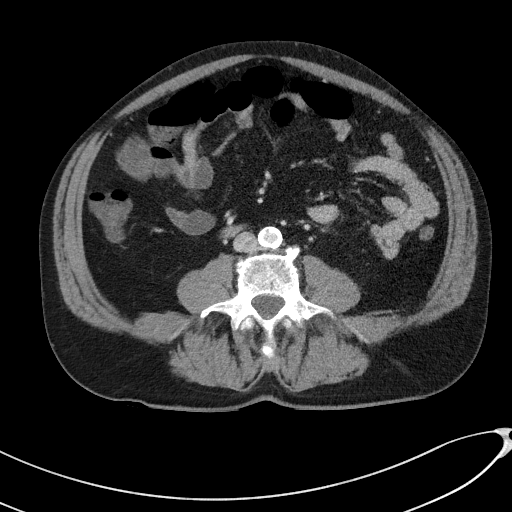
[im 53/86  soft-tissue]
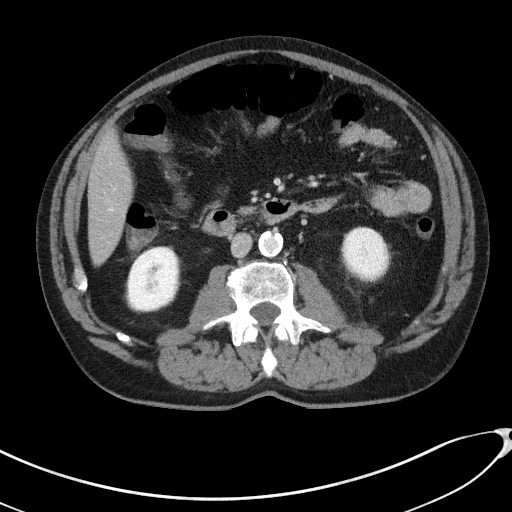
[im 59/86  soft-tissue]
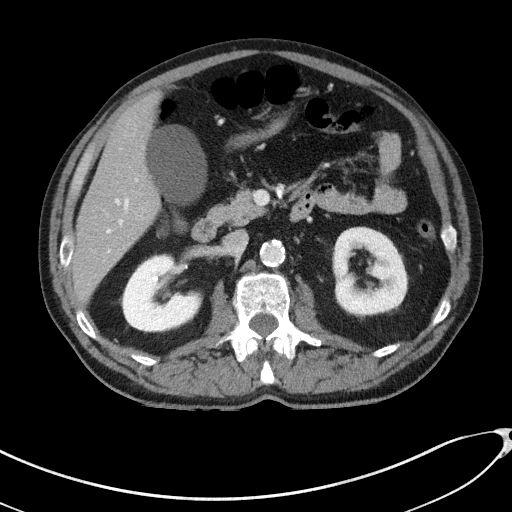
[im 59/86  bone]
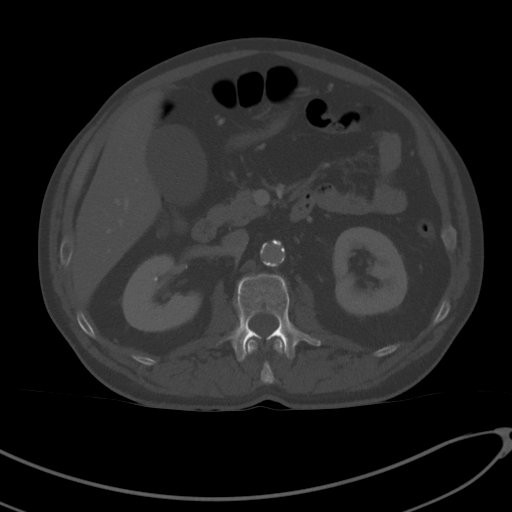
[im 66/86  soft-tissue]
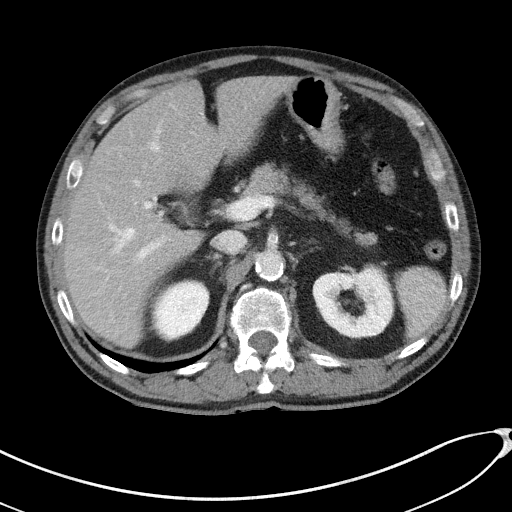
[im 72/86  soft-tissue]
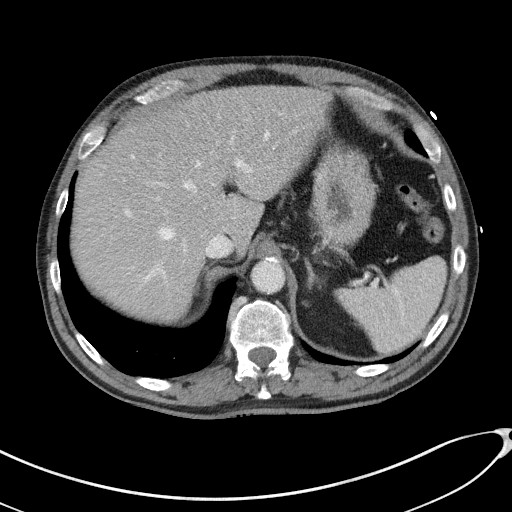
[im 79/86  soft-tissue]
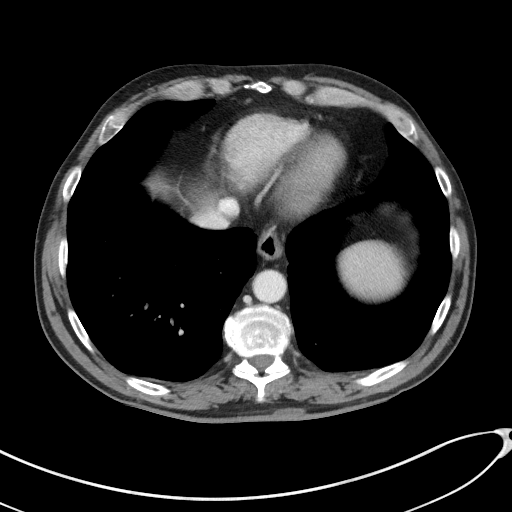

[Series 5: coronal st · coronal · 0.75mm/px · 3 of 94 slices shown]
[im 32/94  soft-tissue]
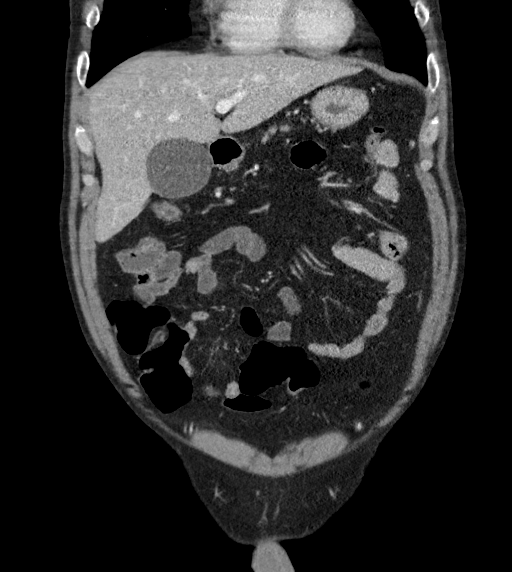
[im 42/94  soft-tissue]
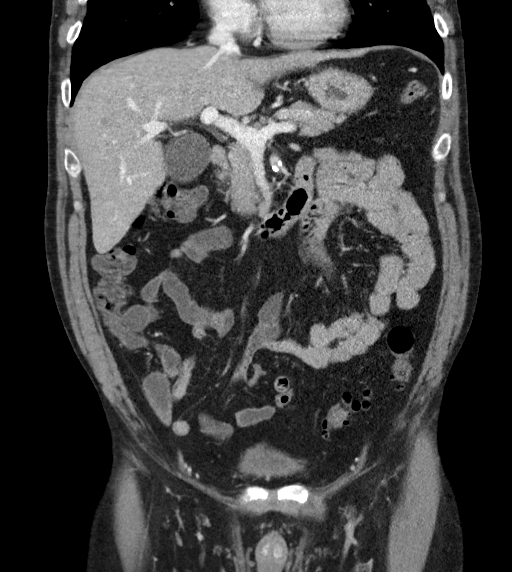
[im 52/94  soft-tissue]
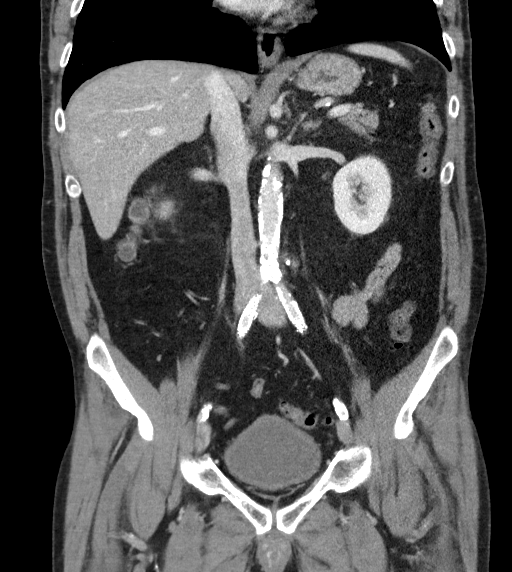

[15 of 46 positions shown; findings below may reference images not displayed]

FINDINGS: Lower chest: 1 centrilobular emphysema. Minor lung base atelectasis.
No cardiomegaly, pericardial effusion, pleural effusion.

Hepatobiliary: Borderline hepatic steatosis. Gallbladder appears
mildly distended but there is no pericholecystic inflammation. No
cholelithiasis is evident. No bile duct enlargement.

Pancreas: Negative.

Spleen: Negative.

Adrenals/Urinary Tract: Normal adrenal glands.

Bilateral renal enhancement and contrast excretion is symmetric and
normal. Normal proximal ureters. 4 millimeter right midpole
nephrolithiasis. Numerous pelvic phleboliths. Decompressed and
normal ureters to the urinary bladder. Diminutive urinary bladder
with mild wall thickening and perhaps mild perivesical stranding
(series 2, image 70). No gas within the bladder.

Stomach/Bowel: Decompressed and negative rectum. Diverticulosis of
the proximal sigmoid and distal descending colon, but no convincing
active inflammation (coronal image 47).

Negative splenic flexure, transverse colon, right colon. Normal
appendix (series 2, image 57). Negative terminal ileum.

No dilated small bowel. Decompressed stomach and duodenum. No free
air, free fluid.

Vascular/Lymphatic: Severe Aortoiliac calcified atherosclerosis.
Major arterial structures remain patent. Portal venous system is
patent.

No lymphadenopathy.

Reproductive: Negative.

Other: No pelvic free fluid.

Musculoskeletal: No acute osseous abnormality identified.
IMPRESSION: 1. Mild bladder wall thickening and perhaps mild perivesical
stranding raising the possibility of urinary tract infection.
2. Right nephrolithiasis. No obstructive uropathy.
3. Normal appendix. Diverticulosis of the distal descending and
sigmoid colon but no convincing bowel inflammation.
4. Severe Aortic Atherosclerosis (M6ZLV-CNB.B). Emphysema
(M6ZLV-HI8.2).

## 2021-03-21 ENCOUNTER — Other Ambulatory Visit: Payer: Self-pay | Admitting: Family Medicine

## 2021-03-21 DIAGNOSIS — R1030 Lower abdominal pain, unspecified: Secondary | ICD-10-CM

## 2021-04-07 ENCOUNTER — Ambulatory Visit: Payer: Medicare Other

## 2021-04-15 ENCOUNTER — Other Ambulatory Visit: Payer: Self-pay

## 2021-04-15 ENCOUNTER — Ambulatory Visit (INDEPENDENT_AMBULATORY_CARE_PROVIDER_SITE_OTHER): Payer: Medicare Other | Admitting: Family Medicine

## 2021-04-15 ENCOUNTER — Encounter: Payer: Self-pay | Admitting: Family Medicine

## 2021-04-15 VITALS — BP 124/76 | HR 68 | Temp 98.0°F | Ht 64.0 in | Wt 151.4 lb

## 2021-04-15 DIAGNOSIS — R1030 Lower abdominal pain, unspecified: Secondary | ICD-10-CM | POA: Diagnosis not present

## 2021-04-15 DIAGNOSIS — K219 Gastro-esophageal reflux disease without esophagitis: Secondary | ICD-10-CM | POA: Diagnosis not present

## 2021-04-15 DIAGNOSIS — E7800 Pure hypercholesterolemia, unspecified: Secondary | ICD-10-CM | POA: Diagnosis not present

## 2021-04-15 DIAGNOSIS — I739 Peripheral vascular disease, unspecified: Secondary | ICD-10-CM

## 2021-04-15 DIAGNOSIS — I70213 Atherosclerosis of native arteries of extremities with intermittent claudication, bilateral legs: Secondary | ICD-10-CM

## 2021-04-15 DIAGNOSIS — F330 Major depressive disorder, recurrent, mild: Secondary | ICD-10-CM

## 2021-04-15 DIAGNOSIS — F411 Generalized anxiety disorder: Secondary | ICD-10-CM

## 2021-04-15 MED ORDER — MELOXICAM 7.5 MG PO TABS: 7.5000 mg | ORAL_TABLET | Freq: Every day | ORAL | 1 refills | Status: DC

## 2021-04-15 MED ORDER — ATORVASTATIN CALCIUM 80 MG PO TABS: 80.0000 mg | ORAL_TABLET | Freq: Every day | ORAL | 1 refills | Status: DC

## 2021-04-15 MED ORDER — DICYCLOMINE HCL 10 MG PO CAPS: 10.0000 mg | ORAL_CAPSULE | Freq: Three times a day (TID) | ORAL | 1 refills | Status: DC

## 2021-04-15 MED ORDER — CLOPIDOGREL BISULFATE 75 MG PO TABS: 75.0000 mg | ORAL_TABLET | Freq: Every day | ORAL | 1 refills | Status: DC

## 2021-04-15 MED ORDER — POTASSIUM CHLORIDE ER 10 MEQ PO TBCR
20.0000 meq | EXTENDED_RELEASE_TABLET | Freq: Two times a day (BID) | ORAL | 1 refills | Status: DC
Start: 2021-04-15 — End: 2021-10-19

## 2021-04-15 MED ORDER — FLUOXETINE HCL 40 MG PO CAPS: 40.0000 mg | ORAL_CAPSULE | Freq: Every day | ORAL | 2 refills | Status: DC

## 2021-04-15 MED ORDER — PANTOPRAZOLE SODIUM 40 MG PO TBEC: 40.0000 mg | DELAYED_RELEASE_TABLET | Freq: Every day | ORAL | 1 refills | Status: DC

## 2021-04-15 NOTE — Progress Notes (Signed)
Assessment & Plan:  1. High cholesterol Well controlled on current regimen.  - atorvastatin (LIPITOR) 80 MG tablet; Take 1 tablet (80 mg total) by mouth daily at 6 PM.  Dispense: 90 tablet; Refill: 1 - CMP14+EGFR - Lipid panel  2. Peripheral vascular disease, unspecified ([place]) Patient to schedule follow-up with vascular. - clopidogrel (PLAVIX) 75 MG tablet; Take 1 tablet (75 mg total) by mouth daily.  Dispense: 90 tablet; Refill: 1 - CMP14+EGFR - Lipid panel  3. Atherosclerosis of native artery of both lower extremities with intermittent claudication [facility]) Patient to schedule follow-up with vascular. - CMP14+EGFR - Lipid panel  4. Gastroesophageal reflux disease, unspecified whether esophagitis present Well controlled on current regimen.  - pantoprazole (PROTONIX) 40 MG tablet; Take 1 tablet (40 mg total) by mouth daily.  Dispense: 90 tablet; Refill: 1 - CMP14+EGFR  5. Lower abdominal pain Uncontrolled. Declined referral to address. - dicyclomine (BENTYL) 10 MG capsule; Take 1 capsule (10 mg total) by mouth 3 (three) times daily before meals.  Dispense: 270 capsule; Refill: 1 - meloxicam (MOBIC) 7.5 MG tablet; Take 1 tablet (7.5 mg total) by mouth daily.  Dispense: 90 tablet; Refill: 1 - CMP14+EGFR  6. Mild episode of recurrent major depressive disorder (HCC) Uncontrolled. Prozac increased from 20 to 40 mg daily.  - FLUoxetine (PROZAC) 40 MG capsule; Take 1 capsule (40 mg total) by mouth daily.  Dispense: 30 capsule; Refill: 2 - CBC with Differential/Platelet - CMP14+EGFR  7. Generalized anxiety disorder Uncontrolled. Prozac increased from 20 to 40 mg daily.  - FLUoxetine (PROZAC) 40 MG capsule; Take 1 capsule (40 mg total) by mouth daily.  Dispense: 30 capsule; Refill: 2 - CBC with Differential/Platelet - CMP14+EGFR   Return in about 6 weeks (around 05/27/2021) for Depression/Anxiety.  Hendricks Limes, MSN, APRN, FNP-C [facility]  Subjective:     [name], male    DOB: 06/23/1960, 60 y.o.   MRN: [id number]  Patient Care Team: Loman Brooklyn, FNP as PCP - [facility], [facility] Francella Solian, MD as Referring Physician (Cardiology)   Chief Complaint:  Chief Complaint  Patient presents with   6 month follow up    Cardiac medication management    HPI: Cameron Ware is a 60 y.o. male presenting on 04/15/2021 for 6 month follow up (Cardiac medication management)  Hyperlipidemia/PVD: taking atorvastatin. He is due for a follow-up with vascular.  The 10-year ASCVD risk score (Arnett DK, et al., 2019) is: 12.2%   Values used to calculate the score:     Age: 60 years     Sex: Male     Is Non-Hispanic African American: No     Diabetic: No     Tobacco smoker: No     Systolic Blood Pressure: 203 mmHg     Is BP treated: No     HDL Cholesterol: 52 mg/dL     Total Cholesterol: 159 mg/dL  GERD: taking protonix.  Lower abdominal pain: chronic pain just above his pubic bone. The pain is constant.  Describes it as 1 million goldfish nibbling on the insides and squeezing.  States it is very frustrating and aggravating.  This has been going on for a long time.  States he was told in the past that he had a thin stomach from his smoking and drinking. No cause has been identified through either vascular or GI, CT scans or aortoiliac duplex.   I previously offered a referral  to physical medicine and rehabilitation for conservative treatment/possible injections that he declined.  He has done pain management in the past but does not like taking pain medicines, so he quit going and quit taking the medication. He remains concerned about this, but continues to decline a referral and states he will follow back up with vascular.  Anxiety/Depression: taking Prozac. Previously failed treatment with Celexa. He is having a hard time right now; his mom is currently on Hospice.   Depression  screen [facility] 2/9 04/15/2021 10/13/2020 12/25/2019  Decreased Interest 2 3 1   Down, Depressed, Hopeless 3 1 2   PHQ - 2 Score 5 4 3   Altered sleeping 3 0 0  Tired, decreased energy 3 3 0  Change in appetite 3 3 2   Feeling bad or failure about yourself  3 3 2   Trouble concentrating 0 0 0  Moving slowly or fidgety/restless 3 0 0  Suicidal thoughts 0 0 2  PHQ-9 Score 20 13 9   Difficult doing work/chores - Not difficult at all Somewhat difficult   GAD 7 : Generalized Anxiety Score 04/15/2021 10/13/2020 12/25/2019 11/04/2019  Nervous, Anxious, on Edge 3 3 2 2   Control/stop worrying 3 3 3 3   Worry too much - different things 3 3 2 3   Trouble relaxing 3 1 0 3  Restless 3 1 2 2   Easily annoyed or irritable 3 1 2 2   Afraid - awful might happen 3 1 2 3   Total GAD 7 Score 21 13 13 18   Anxiety Difficulty Somewhat difficult Not difficult at all Somewhat difficult Somewhat difficult    New complaints: None  Social history:  Relevant past medical, surgical, family and social history reviewed and updated as indicated. Interim medical history since our last visit reviewed.  Allergies and medications reviewed and updated.  DATA REVIEWED: CHART IN EPIC  ROS: Negative unless specifically indicated above in HPI.    Current Outpatient Medications:    aspirin EC 325 MG tablet, Take 325 mg by mouth daily., Disp: , Rfl:    atorvastatin (LIPITOR) 80 MG tablet, Take 1 tablet (80 mg total) by mouth daily at 6 PM., Disp: 90 tablet, Rfl: 1   clopidogrel (PLAVIX) 75 MG tablet, TAKE 1 TABLET ONCE A DAY, Disp: 90 tablet, Rfl: 0   dicyclomine (BENTYL) 10 MG capsule, TAKE 1 CAPSULE 3 TIMES A DAY FOR SPASMS, Disp: 90 capsule, Rfl: 0   FLUoxetine (PROZAC) 20 MG capsule, Take 1 capsule (20 mg total) by mouth daily., Disp: 90 capsule, Rfl: 1   meloxicam (MOBIC) 7.5 MG tablet, Take 1 tablet (7.5 mg total) by mouth daily., Disp: 90 tablet, Rfl: 2   pantoprazole (PROTONIX) 40 MG tablet, Take 1 tablet (40 mg total) by  mouth daily., Disp: 90 tablet, Rfl: 1   potassium chloride (KLOR-CON) 10 MEQ tablet, Take 2 tablets (20 mEq total) by mouth 2 (two) times daily., Disp: 360 tablet, Rfl: 1   No Known Allergies Past Medical History:  Diagnosis Date   Anorexia 11/29/2010   Arthritis    Decreased vision    Depression    Dyspnea on exertion    Generalized headaches    High cholesterol 01/30/2017   Hypertension    Peripheral vascular disease ([place])    Stroke [facility])     Past Surgical History:  Procedure Laterality Date   ABDOMINAL AORTAGRAM N/A 08/22/2011   Procedure: ABDOMINAL Maxcine Ham;  Surgeon: Serafina Mitchell, MD;  Location: [facility] CATH LAB;  Service: Cardiovascular;  Laterality: N/A;  aortogram     COLONOSCOPY N/A 06/07/2017   Procedure: COLONOSCOPY;  Surgeon: Rogene Houston, MD;  Location: [facility] ENDO SUITE;  Service: Endoscopy;  Laterality: N/A;  150   PERCUTANEOUS STENT INTERVENTION Bilateral 08/22/2011   Procedure: PERCUTANEOUS STENT INTERVENTION;  Surgeon: Serafina Mitchell, MD;  Location: [facility] CATH LAB;  Service: Cardiovascular;  Laterality: Bilateral;   Stents  August 22, 2011   Bilateral iliac PTA and stenting    Social History   Socioeconomic History   Marital status: Single    Spouse name: Not on file   Number of children: Not on file   Years of education: Not on file   Highest education level: Not on file  Occupational History   Not on file  Tobacco Use   Smoking status: Former    Packs/day: 1.00    Years: 40.00    Pack years: 40.00    Types: Cigarettes    Quit date: 08/04/2012    Years since quitting: 8.7   Smokeless tobacco: Never   Tobacco comments:    quit smoking 5 yrs ago. smoked one cigarette yesterday (09/25/2018)  Vaping Use   Vaping Use: Former  Substance and Sexual Activity   Alcohol use: Yes    Comment: wine every other day; 2-3 a day on 02/20/2019   Drug use: Yes    Types: Marijuana   Sexual activity: Not on file  Other Topics Concern   Not on file  Social History  Narrative   Not on file   Social Determinants of Health   Financial Resource Strain: Not on file  Food Insecurity: Not on file  Transportation Needs: Not on file  Physical Activity: Not on file  Stress: Not on file  Social Connections: Not on file  Intimate Partner Violence: Not on file        Objective:    BP 124/76   Pulse 68   Temp 98 F (36.7 C)   Ht 5' 4"  (1.626 m)   Wt 151 lb 6.4 oz (68.7 kg)   SpO2 98%   BMI 25.99 kg/m   Wt Readings from Last 3 Encounters:  04/15/21 151 lb 6.4 oz (68.7 kg)  10/13/20 153 lb 6.4 oz (69.6 kg)  12/25/19 145 lb 6.4 oz (66 kg)    Physical Exam Vitals reviewed.  Constitutional:      General: He is not in acute distress.    Appearance: Normal appearance. He is normal weight. He is not ill-appearing, toxic-appearing or diaphoretic.  HENT:     Head: Normocephalic and atraumatic.  Eyes:     General: No scleral icterus.       Right eye: No discharge.        Left eye: No discharge.     Conjunctiva/sclera: Conjunctivae normal.  Cardiovascular:     Rate and Rhythm: Normal rate and regular rhythm.     Heart sounds: Normal heart sounds. No murmur heard.   No friction rub. No gallop.  Pulmonary:     Effort: Pulmonary effort is normal. No respiratory distress.     Breath sounds: Normal breath sounds. No stridor. No wheezing, rhonchi or rales.  Musculoskeletal:        General: Normal range of motion.     Cervical back: Normal range of motion.  Skin:    General: Skin is warm and dry.  Neurological:     Mental Status: He is alert and oriented to person, place, and time. Mental status is at baseline.  Psychiatric:        Mood and Affect: Mood normal.        Behavior: Behavior normal.        Thought Content: Thought content normal.        Judgment: Judgment normal.    Lab Results  Component Value Date   TSH 2.470 10/13/2020   Lab Results  Component Value Date   WBC 6.2 10/13/2020   HGB 15.8 10/13/2020   HCT 44.9 10/13/2020    MCV 87 10/13/2020   PLT 259 10/13/2020   Lab Results  Component Value Date   NA 139 10/13/2020   K 3.9 10/13/2020   CO2 24 10/13/2020   GLUCOSE 99 10/13/2020   BUN 5 (L) 10/13/2020   CREATININE 0.90 10/13/2020   BILITOT 0.7 10/13/2020   ALKPHOS 163 (H) 10/13/2020   AST 20 10/13/2020   ALT 13 10/13/2020   PROT 6.8 10/13/2020   ALBUMIN 4.3 10/13/2020   CALCIUM 9.5 10/13/2020   ANIONGAP 12 09/25/2018   EGFR 97 10/13/2020   Lab Results  Component Value Date   CHOL 159 10/13/2020   Lab Results  Component Value Date   HDL 52 10/13/2020   Lab Results  Component Value Date   LDLCALC 89 10/13/2020   Lab Results  Component Value Date   TRIG 99 10/13/2020   Lab Results  Component Value Date   CHOLHDL 3.1 10/13/2020   No results found for: HGBA1C

## 2021-04-16 LAB — CBC WITH DIFFERENTIAL/PLATELET
Basophils Absolute: 0.1 10*3/uL (ref 0.0–0.2)
Basos: 1 %
EOS (ABSOLUTE): 0.3 10*3/uL (ref 0.0–0.4)
Eos: 4 %
Hematocrit: 47.4 % (ref 37.5–51.0)
Hemoglobin: 16.1 g/dL (ref 13.0–17.7)
Immature Grans (Abs): 0 10*3/uL (ref 0.0–0.1)
Immature Granulocytes: 0 %
Lymphocytes Absolute: 1.4 10*3/uL (ref 0.7–3.1)
Lymphs: 19 %
MCH: 30.7 pg (ref 26.6–33.0)
MCHC: 34 g/dL (ref 31.5–35.7)
MCV: 91 fL (ref 79–97)
Monocytes Absolute: 0.9 10*3/uL (ref 0.1–0.9)
Monocytes: 12 %
Neutrophils Absolute: 4.8 10*3/uL (ref 1.4–7.0)
Neutrophils: 64 %
Platelets: 277 10*3/uL (ref 150–450)
RBC: 5.24 x10E6/uL (ref 4.14–5.80)
RDW: 13.1 % (ref 11.6–15.4)
WBC: 7.3 10*3/uL (ref 3.4–10.8)

## 2021-04-16 LAB — CMP14+EGFR
ALT: 14 IU/L (ref 0–44)
AST: 17 IU/L (ref 0–40)
Albumin/Globulin Ratio: 1.9 (ref 1.2–2.2)
Albumin: 4.4 g/dL (ref 3.8–4.8)
Alkaline Phosphatase: 145 IU/L — ABNORMAL HIGH (ref 44–121)
BUN/Creatinine Ratio: 15 (ref 10–24)
BUN: 13 mg/dL (ref 8–27)
Bilirubin Total: 0.3 mg/dL (ref 0.0–1.2)
CO2: 23 mmol/L (ref 20–29)
Calcium: 10.2 mg/dL (ref 8.6–10.2)
Chloride: 100 mmol/L (ref 96–106)
Creatinine, Ser: 0.86 mg/dL (ref 0.76–1.27)
Globulin, Total: 2.3 g/dL (ref 1.5–4.5)
Glucose: 99 mg/dL (ref 70–99)
Potassium: 4.7 mmol/L (ref 3.5–5.2)
Sodium: 139 mmol/L (ref 134–144)
Total Protein: 6.7 g/dL (ref 6.0–8.5)
eGFR: 98 mL/min/{1.73_m2} (ref >59)

## 2021-04-16 LAB — LIPID PANEL
Chol/HDL Ratio: 2.9 ratio (ref 0.0–5.0)
Cholesterol, Total: 159 mg/dL (ref 100–199)
HDL: 55 mg/dL (ref >39)
LDL Chol Calc (NIH): 86 mg/dL (ref 0–99)
Triglycerides: 96 mg/dL (ref 0–149)
VLDL Cholesterol Cal: 18 mg/dL (ref 5–40)

## 2021-04-19 ENCOUNTER — Encounter: Payer: Self-pay | Admitting: Family Medicine

## 2021-05-07 ENCOUNTER — Other Ambulatory Visit: Payer: Self-pay | Admitting: Family Medicine

## 2021-05-07 DIAGNOSIS — F411 Generalized anxiety disorder: Secondary | ICD-10-CM

## 2021-05-07 DIAGNOSIS — F330 Major depressive disorder, recurrent, mild: Secondary | ICD-10-CM

## 2021-05-25 ENCOUNTER — Telehealth: Payer: Self-pay

## 2021-05-25 ENCOUNTER — Ambulatory Visit: Payer: Medicare Other

## 2021-05-25 NOTE — Telephone Encounter (Signed)
Patient of [name], FNP, who was scheduled for an AWV today. Tried to call patient x and both times, it sounds like the phone was disconnected after 1 ring. Thank you. Mjp,lpn

## 2021-06-01 ENCOUNTER — Encounter: Payer: Self-pay | Admitting: Family Medicine

## 2021-06-01 ENCOUNTER — Ambulatory Visit (INDEPENDENT_AMBULATORY_CARE_PROVIDER_SITE_OTHER): Payer: Medicare Other | Admitting: Family Medicine

## 2021-06-01 VITALS — BP 131/69 | HR 57 | Temp 97.2°F | Ht 64.0 in | Wt 150.2 lb

## 2021-06-01 DIAGNOSIS — F331 Major depressive disorder, recurrent, moderate: Secondary | ICD-10-CM | POA: Diagnosis not present

## 2021-06-01 DIAGNOSIS — F411 Generalized anxiety disorder: Secondary | ICD-10-CM

## 2021-06-01 NOTE — Progress Notes (Signed)
Assessment & Plan:  1-2. Moderate episode of recurrent major depressive disorder (HCC)/Generalized anxiety disorder - [facility] testing - will notify patient with results and make adjustment to therapy based on results   Return in about 2 months (around 07/30/2021) for anxiety/depression.  Lucile Crater, NP Student  I personally was present during the history, physical exam, and medical decision-making activities of this service and have verified that the service and findings are accurately documented in the nurse practitioner student's note.  Hendricks Limes, MSN, APRN, FNP-C [facility]   Subjective:    [name], male    DOB: Sep 30, 1960, 61 y.o.   MRN: [id number]  Patient Care Team: Loman Brooklyn, FNP as PCP - [facility], [facility] Francella Solian, MD as Referring Physician (Cardiology)   Chief Complaint:  Chief Complaint  Patient presents with   Depression   Anxiety    6 week follow up- States he is a little better.     HPI: Cameron Ware is a 61 y.o. male presenting on 06/01/2021 for Depression and Anxiety (6 week follow up- States he is a little better. )   Anxiety/Depression: taking Prozac. Previously failed treatment with Celexa. He is having a hard time right now; his mom is currently on Hospice, and his father passed 2 years ago. He is still not controlled on Prozac. He is interested in [place] testing to find a medicine that may work better for him.   Depression screen [facility] 2/9 06/01/2021 04/15/2021 10/13/2020  Decreased Interest 3 2 3   Down, Depressed, Hopeless 3 3 1   PHQ - 2 Score 6 5 4   Altered sleeping 0 3 0  Tired, decreased energy 3 3 3   Change in appetite 3 3 3   Feeling bad or failure about yourself  3 3 3   Trouble concentrating 3 0 0  Moving slowly or fidgety/restless 0 3 0  Suicidal thoughts 0 0 0  PHQ-9 Score 18 20 13   Difficult doing work/chores Not difficult at  all - Not difficult at all   GAD 7 : Generalized Anxiety Score 06/01/2021 04/15/2021 10/13/2020 12/25/2019  Nervous, Anxious, on Edge 3 3 3 2   Control/stop worrying 3 3 3 3   Worry too much - different things 3 3 3 2   Trouble relaxing 3 3 1  0  Restless 3 3 1 2   Easily annoyed or irritable 3 3 1 2   Afraid - awful might happen 3 3 1 2   Total GAD 7 Score 21 21 13 13   Anxiety Difficulty Not difficult at all Somewhat difficult Not difficult at all Somewhat difficult    New complaints: None   Social history:  Relevant past medical, surgical, family and social history reviewed and updated as indicated. Interim medical history since our last visit reviewed.  Allergies and medications reviewed and updated.  DATA REVIEWED: CHART IN EPIC  ROS: Negative unless specifically indicated above in HPI.    Current Outpatient Medications:    aspirin EC 325 MG tablet, Take 325 mg by mouth daily., Disp: , Rfl:    atorvastatin (LIPITOR) 80 MG tablet, Take 1 tablet (80 mg total) by mouth daily at 6 PM., Disp: 90 tablet, Rfl: 1   clopidogrel (PLAVIX) 75 MG tablet, Take 1 tablet (75 mg total) by mouth daily., Disp: 90 tablet, Rfl: 1   dicyclomine (BENTYL) 10 MG capsule, Take 1 capsule (10 mg total) by mouth 3 (three) times daily before meals., Disp: 270 capsule, Rfl:  1   FLUoxetine (PROZAC) 40 MG capsule, Take 1 capsule (40 mg total) by mouth daily., Disp: 30 capsule, Rfl: 2   meloxicam (MOBIC) 7.5 MG tablet, Take 1 tablet (7.5 mg total) by mouth daily., Disp: 90 tablet, Rfl: 1   pantoprazole (PROTONIX) 40 MG tablet, Take 1 tablet (40 mg total) by mouth daily., Disp: 90 tablet, Rfl: 1   potassium chloride (KLOR-CON) 10 MEQ tablet, Take 2 tablets (20 mEq total) by mouth 2 (two) times daily., Disp: 360 tablet, Rfl: 1   No Known Allergies Past Medical History:  Diagnosis Date   Anorexia 11/29/2010   Arthritis    Decreased vision    Depression    Dyspnea on exertion    Generalized headaches    High  cholesterol 01/30/2017   Hypertension    Peripheral vascular disease ([place])    Stroke [facility])     Past Surgical History:  Procedure Laterality Date   ABDOMINAL AORTAGRAM N/A 08/22/2011   Procedure: ABDOMINAL Maxcine Ham;  Surgeon: Serafina Mitchell, MD;  Location: [facility] CATH LAB;  Service: Cardiovascular;  Laterality: N/A;   aortogram     COLONOSCOPY N/A 06/07/2017   Procedure: COLONOSCOPY;  Surgeon: Rogene Houston, MD;  [facility] ENDO [facility];  Service: Endoscopy;  Laterality: N/A;  150   PERCUTANEOUS STENT INTERVENTION Bilateral 08/22/2011   Procedure: PERCUTANEOUS STENT INTERVENTION;  Surgeon: Serafina Mitchell, MD;  Location: [facility] CATH LAB;  Service: Cardiovascular;  Laterality: Bilateral;   Stents  August 22, 2011   Bilateral iliac PTA and stenting    Social History   Socioeconomic History   Marital status: Single    Spouse name: Not on file   Number of children: Not on file   Years of education: Not on file   Highest education level: Not on file  Occupational History   Not on file  Tobacco Use   Smoking status: Former    Packs/day: 1.00    Years: 40.00    Pack years: 40.00    Types: Cigarettes    Quit date: 08/04/2012    Years since quitting: 8.8   Smokeless tobacco: Never   Tobacco comments:    quit smoking 5 yrs ago. smoked one cigarette yesterday (09/25/2018)  Vaping Use   Vaping Use: Former  Substance and Sexual Activity   Alcohol use: Yes    Comment: wine every other day; 2-3 a day on 02/20/2019   Drug use: Yes    Types: Marijuana   Sexual activity: Not on file  Other Topics Concern   Not on file  Social History Narrative   Not on file   Social Determinants of Health   Financial Resource Strain: Not on file  Food Insecurity: Not on file  Transportation Needs: Not on file  Physical Activity: Not on file  Stress: Not on file  Social Connections: Not on file  Intimate Partner Violence: Not on file        Objective:    BP 131/69    Pulse (!) 57    Temp (!) 97.2  F (36.2 C) (Temporal)    Ht 5' 4"  (1.626 m)    Wt 68.1 kg    SpO2 97%    BMI 25.78 kg/m   Wt Readings from Last 3 Encounters:  06/01/21 150 lb 3.2 oz (68.1 kg)  04/15/21 151 lb 6.4 oz (68.7 kg)  10/13/20 153 lb 6.4 oz (69.6 kg)    Physical Exam Vitals reviewed.  Constitutional:  General: He is not in acute distress.    Appearance: Normal appearance. He is normal weight. He is not ill-appearing, toxic-appearing or diaphoretic.  HENT:     Head: Normocephalic and atraumatic.  Eyes:     General: No scleral icterus.       Right eye: No discharge.        Left eye: No discharge.     Conjunctiva/sclera: Conjunctivae normal.  Cardiovascular:     Rate and Rhythm: Normal rate and regular rhythm.     Heart sounds: Normal heart sounds. No murmur heard.   No friction rub. No gallop.  Pulmonary:     Effort: Pulmonary effort is normal. No respiratory distress.     Breath sounds: Normal breath sounds. No stridor. No wheezing, rhonchi or rales.  Musculoskeletal:        General: Normal range of motion.     Cervical back: Normal range of motion.  Skin:    General: Skin is warm and dry.  Neurological:     Mental Status: He is alert and oriented to person, place, and time. Mental status is at baseline.  Psychiatric:        Mood and Affect: Mood normal.        Behavior: Behavior normal.        Thought Content: Thought content normal.        Judgment: Judgment normal.    Lab Results  Component Value Date   TSH 2.470 10/13/2020   Lab Results  Component Value Date   WBC 7.3 04/15/2021   HGB 16.1 04/15/2021   HCT 47.4 04/15/2021   MCV 91 04/15/2021   PLT 277 04/15/2021   Lab Results  Component Value Date   NA 139 04/15/2021   K 4.7 04/15/2021   CO2 23 04/15/2021   GLUCOSE 99 04/15/2021   BUN 13 04/15/2021   CREATININE 0.86 04/15/2021   BILITOT 0.3 04/15/2021   ALKPHOS 145 (H) 04/15/2021   AST 17 04/15/2021   ALT 14 04/15/2021   PROT 6.7 04/15/2021   ALBUMIN 4.4  04/15/2021   CALCIUM 10.2 04/15/2021   ANIONGAP 12 09/25/2018   EGFR 98 04/15/2021   Lab Results  Component Value Date   CHOL 159 04/15/2021   Lab Results  Component Value Date   HDL 55 04/15/2021   Lab Results  Component Value Date   LDLCALC 86 04/15/2021   Lab Results  Component Value Date   TRIG 96 04/15/2021   Lab Results  Component Value Date   CHOLHDL 2.9 04/15/2021   No results found for: HGBA1C

## 2021-06-08 ENCOUNTER — Ambulatory Visit (INDEPENDENT_AMBULATORY_CARE_PROVIDER_SITE_OTHER): Payer: Medicare Other

## 2021-06-08 VITALS — Ht 64.0 in | Wt 150.0 lb

## 2021-06-08 DIAGNOSIS — Z0000 Encounter for general adult medical examination without abnormal findings: Secondary | ICD-10-CM

## 2021-06-08 NOTE — Progress Notes (Signed)
Subjective:   Cameron Ware is a 61 y.o. male who presents for an Initial Medicare Annual Wellness Visit. Virtual Visit via Telephone Note  I connected with  Cameron Ware on 06/08/21 at  1:15 PM EST by telephone and verified that I am speaking with the correct person using two identifiers.  Location: Patient: Home Provider: [facility] Persons participating in the virtual visit: patient/Nurse Health Advisor   I discussed the limitations, risks, security and privacy concerns of performing an evaluation and management service by telephone and the availability of in person appointments. The patient expressed understanding and agreed to proceed.  Interactive audio and video telecommunications were attempted between this nurse and patient, however failed, due to patient having technical difficulties OR patient did not have access to video capability.  We continued and completed visit with audio only.  Some vital signs may be absent or patient reported.   Darral Dash, LPN  Review of Systems     Cardiac Risk Factors include: advanced age (>27men, >42 women);dyslipidemia;male gender;sedentary lifestyle;Other (see comment), Risk factor comments: PAD  PHONE VISIT. PT AT HOME. NURSE AT [facility]    Objective:    Today's Vitals   06/08/21 1318  Weight: 150 lb (68 kg)  Height: 5\' 4"  (1.626 m)   Body mass index is 25.75 kg/m.  Advanced Directives 06/08/2021 08/25/2019 09/25/2018 06/07/2017 11/13/2016 08/04/2016 01/07/2016  Does Patient Have a Medical Advance Directive? No No No No No No No  Would patient like information on creating a medical advance directive? No - Patient declined No - Patient declined No - Patient declined No - Patient declined - - No - patient declined information  Pre-existing out of facility DNR order (yellow form or pink MOST form) - - - - - - -    Current Medications (verified) Outpatient Encounter Medications as of 06/08/2021  Medication Sig   aspirin  EC 325 MG tablet Take 325 mg by mouth daily.   atorvastatin (LIPITOR) 80 MG tablet Take 1 tablet (80 mg total) by mouth daily at 6 PM.   clopidogrel (PLAVIX) 75 MG tablet Take 1 tablet (75 mg total) by mouth daily.   dicyclomine (BENTYL) 10 MG capsule Take 1 capsule (10 mg total) by mouth 3 (three) times daily before meals.   FLUoxetine (PROZAC) 40 MG capsule Take 1 capsule (40 mg total) by mouth daily.   meloxicam (MOBIC) 7.5 MG tablet Take 1 tablet (7.5 mg total) by mouth daily.   pantoprazole (PROTONIX) 40 MG tablet Take 1 tablet (40 mg total) by mouth daily.   potassium chloride (KLOR-CON) 10 MEQ tablet Take 2 tablets (20 mEq total) by mouth 2 (two) times daily.   [DISCONTINUED] pravastatin (PRAVACHOL) 40 MG tablet Take 40 mg by mouth daily.     No facility-administered encounter medications on file as of 06/08/2021.    Allergies (verified) Patient has no known allergies.   History: Past Medical History:  Diagnosis Date   Anorexia 11/29/2010   Arthritis    Decreased vision    Depression    Dyspnea on exertion    Generalized headaches    High cholesterol 01/30/2017   Hypertension    Peripheral vascular disease (HCC)    Stroke [facility])    Past Surgical History:  Procedure Laterality Date   ABDOMINAL AORTAGRAM N/A 08/22/2011   Procedure: ABDOMINAL 08/24/2011;  Surgeon: Ronny Flurry, MD;  Location: [facility] CATH LAB;  Service: Cardiovascular;  Laterality: N/A;   aortogram  COLONOSCOPY N/A 06/07/2017   Procedure: COLONOSCOPY;  Surgeon: Malissa Hippo, MD;  Location: [facility] ENDO SUITE;  Service: Endoscopy;  Laterality: N/A;  150   PERCUTANEOUS STENT INTERVENTION Bilateral 08/22/2011   Procedure: PERCUTANEOUS STENT INTERVENTION;  Surgeon: Nada Libman, MD;  Location: [facility] CATH LAB;  Service: Cardiovascular;  Laterality: Bilateral;   Stents  August 22, 2011   Bilateral iliac PTA and stenting   Family History  Problem Relation Age of Onset   Heart disease Mother    Diabetes Father     Colon cancer Father    Social History   Socioeconomic History   Marital status: Single    Spouse name: Not on file   Number of children: Not on file   Years of education: Not on file   Highest education level: Not on file  Occupational History   Not on file  Tobacco Use   Smoking status: Former    Packs/day: 1.00    Years: 40.00    Pack years: 40.00    Types: Cigarettes    Quit date: 08/04/2012    Years since quitting: 8.8   Smokeless tobacco: Never   Tobacco comments:    quit smoking 5 yrs ago. smoked one cigarette yesterday (09/25/2018)  Vaping Use   Vaping Use: Former  Substance and Sexual Activity   Alcohol use: Yes    Comment: wine every other day; 2-3 a day on 02/20/2019   Drug use: Yes    Types: Marijuana   Sexual activity: Not on file  Other Topics Concern   Not on file  Social History Narrative   Lives with mother, Cameron Ware, who is currently under Hospice care-06/08/2021.   Brother and sister in law live nearby and helps with pt's mom.   Social Determinants of Health   Financial Resource Strain: Low Risk    Difficulty of Paying Living Expenses: Not hard at all  Food Insecurity: No Food Insecurity   Worried About Programme researcher, broadcasting/film/video in the Last Year: Never true   Ran Out of Food in the Last Year: Never true  Transportation Needs: No Transportation Needs   Lack of Transportation (Medical): No   Lack of Transportation (Non-Medical): No  Physical Activity: Insufficiently Active   Days of Exercise per Week: 4 days   Minutes of Exercise per Session: 30 min  Stress: Stress Concern Present   Feeling of Stress : To some extent  Social Connections: Moderately Integrated   Frequency of Communication with Friends and Family: More than three times a week   Frequency of Social Gatherings with Friends and Family: More than three times a week   Attends Religious Services: 1 to 4 times per year   Active Member of [employer] or Organizations: Yes   Attends Banker  Meetings: 1 to 4 times per year   Marital Status: Never married    Tobacco Counseling Counseling given: Not Answered Tobacco comments: quit smoking 5 yrs ago. smoked one cigarette yesterday (09/25/2018)   Clinical Intake:  Pre-visit preparation completed: Yes  Pain : No/denies pain     BMI - recorded: 25.75 Nutritional Status: BMI 25 -29 Overweight Nutritional Risks: None Diabetes: No  How often do you need to have someone help you when you read instructions, pamphlets, or other written materials from your doctor or pharmacy?: 1 - Never  Diabetic?no  Interpreter Needed?: No  Information entered by :: MJ Gio Janoski, LPN   Activities of Daily Living In your present state of  health, do you have any difficulty performing the following activities: 06/08/2021  Hearing? N  Vision? N  Difficulty concentrating or making decisions? Y  Comment memory at time.  Walking or climbing stairs? N  Dressing or bathing? N  Doing errands, shopping? N  Preparing Food and eating ? N  Using the Toilet? N  In the past six months, have you accidently leaked urine? N  Do you have problems with loss of bowel control? N  Managing your Medications? N  Managing your Finances? N  Housekeeping or managing your Housekeeping? N  Some recent data might be hidden    Patient Care Team: Gwenlyn FudgeJoyce, Britney F, FNP as PCP - General (Family Medicine) [facility] Jodie Echevariaan, Osie CheeksWalter Ang, MD as Referring Physician (Cardiology)  Indicate any recent Medical Services you may have received from other than [facility] providers in the past year (date may be approximate).     Assessment:   This is a routine wellness examination for Cameron Ware.  Hearing/Vision screen Hearing Screening - Comments:: No hearing issues.  Vision Screening - Comments:: Has never had eye exam but states he will schedule in the next few months.   Dietary issues and exercise activities discussed: Current Exercise Habits: Home exercise  routine, Type of exercise: walking, Time (Minutes): 30, Frequency (Times/Week): 4, Weekly Exercise (Minutes/Week): 120, Intensity: Mild, Exercise limited by: cardiac condition(s)   Goals Addressed             This Visit's Progress    Exercise 3x per week (30 min per time)       Continue to exercise.       Depression Screen PHQ 2/9 Scores 06/08/2021 06/01/2021 04/15/2021 10/13/2020 12/25/2019 11/04/2019 09/23/2019  PHQ - 2 Score 2 6 5 4 3 3 4   PHQ- 9 Score 3 18 20 13 9 10 12   Exception Documentation - - - - Patient refusal - Patient refusal    Fall Risk Fall Risk  06/08/2021 06/01/2021 04/15/2021 10/13/2020 12/25/2019  Falls in the past year? 0 0 0 0 0  Number falls in past yr: 0 - - - -  Injury with Fall? 0 - - - -  Risk for fall due to : No Fall Risks - - - -  Follow up Falls prevention discussed - - - -    FALL RISK PREVENTION PERTAINING TO THE HOME:  Any stairs in or around the home? No  If so, are there any without handrails? No  Home free of loose throw rugs in walkways, pet beds, electrical cords, etc? Yes  Adequate lighting in your home to reduce risk of falls? Yes   ASSISTIVE DEVICES UTILIZED TO PREVENT FALLS:  Life alert? No  Use of a cane, walker or w/c? No  Grab bars in the bathroom? No  Shower chair or bench in shower? No  Elevated toilet seat or a handicapped toilet? No   TIMED UP AND GO:  Was the test performed? No .  Phone visit.  Cognitive Function:     6CIT Screen 06/08/2021  What Year? 0 points  What month? 0 points  What time? 0 points  Count back from 20 0 points  Months in reverse 4 points  Repeat phrase 2 points  Total Score 6    Immunizations Immunization History  Administered Date(s) Administered   Influenza, Quadrivalent, Recombinant, Inj, Pf 04/07/2015   Influenza,inj,Quad PF,6+ Mos 03/01/2016, 04/17/2017, 02/20/2019   Influenza,trivalent, recombinat, inj, PF 03/11/2014   Influenza-Unspecified 05/05/2020, 04/12/2021, 04/12/2021  Moderna Covid-19 Vaccine Bivalent Booster 10102yrs & up 04/12/2021   Moderna Sars-Covid-2 Vaccination 09/08/2019, 10/06/2019, 04/27/2020   Tdap 03/25/2019    TDAP status: Up to date  Flu Vaccine status: Up to date  Pneumococcal vaccine status: Due, Education has been provided regarding the importance of this vaccine. Advised may receive this vaccine at local pharmacy or Health Dept. Aware to provide a copy of the vaccination record if obtained from local pharmacy or Health Dept. Verbalized acceptance and understanding.  Covid-19 vaccine status: Completed vaccines  Qualifies for Shingles Vaccine? Yes   Zostavax completed No   Shingrix Completed?: No.    Education has been provided regarding the importance of this vaccine. Patient has been advised to call insurance company to determine out of pocket expense if they have not yet received this vaccine. Advised may also receive vaccine at local pharmacy or Health Dept. Verbalized acceptance and understanding.  Screening Tests Health Maintenance  Topic Date Due   Zoster Vaccines- Shingrix (1 of 2) 07/16/2021 (Originally 03/18/2009)   HIV Screening  10/13/2021 (Originally [birth year]1974)   Pneumococcal Vaccine 2219-602 Years old (1 - PCV) 06/01/2022 (Originally 03/18/1967)   COLONOSCOPY (Pts 45-6468yrs Insurance coverage will need to be confirmed)  06/08/2027   TETANUS/TDAP  03/24/2029   INFLUENZA VACCINE  Completed   COVID-19 Vaccine  Completed   Hepatitis C Screening  Completed   HPV VACCINES  Aged Out    Health Maintenance  There are no preventive care reminders to display for this patient.  Colorectal cancer screening: Type of screening: Colonoscopy. Completed 06/08/2027. Repeat every 10 years  Lung Cancer Screening: (Low Dose CT Chest recommended if Age 74-80 years, 30 pack-year currently smoking OR have quit w/in 15years.) does not qualify.   Additional Screening:  Hepatitis C Screening: does qualify; Completed 08/11/2016  Vision  Screening: Recommended annual ophthalmology exams for early detection of glaucoma and other disorders of the eye. Is the patient up to date with their annual eye exam?  No  Who is the provider or what is the name of the office in which the patient attends annual eye exams? N/A If pt is not established with a provider, would they like to be referred to a provider to establish care? No .   Dental Screening: Recommended annual dental exams for proper oral hygiene  Community Resource Referral / Chronic Care Management: CRR required this visit?  No   CCM required this visit?  No      Plan:     I have personally reviewed and noted the following in the patients chart:   Medical and social history Use of alcohol, tobacco or illicit drugs  Current medications and supplements including opioid prescriptions. Patient is not currently taking opioid prescriptions. Functional ability and status Nutritional status Physical activity Advanced directives List of other physicians Hospitalizations, surgeries, and ER visits in previous 12 months Vitals Screenings to include cognitive, depression, and falls Referrals and appointments  In addition, I have reviewed and discussed with patient certain preventive protocols, quality metrics, and best practice recommendations. A written personalized care plan for preventive services as well as general preventive health recommendations were provided to patient.     Darral DashMary Jane K Yuri Flener, LPN   1/61/09601/03/2022   Nurse Notes: Pt overdue for eye exam. Pt states he would like to wait and schedule later because his mother, Cameron CounterBertha, was currently placed under Hospice care. Discussed Shingles vaccine and how to obtain. 6CIT score of 6.

## 2021-06-08 NOTE — Patient Instructions (Signed)
[name] , Thank you for taking time to come for your Medicare Wellness Visit. I appreciate your ongoing commitment to your health goals. Please review the following plan we discussed and let me know if I can assist you in the future.   Screening recommendations/referrals: Colonoscopy: Done 06/07/2017 Repeat in 10 years  Recommended yearly ophthalmology/optometry visit for glaucoma screening and checkup Recommended yearly dental visit for hygiene and checkup  Vaccinations: Influenza vaccine: Done 04/12/2021 Repeat annually  Pneumococcal vaccine: Due at age 59. Tdap vaccine: Done 03/25/2019 Repeat in 10 years  Shingles vaccine: Shingrix discussed. Please contact your pharmacy for coverage information.     Covid-19: Done 09/08/2019, 10/06/2019, 04/27/2020 and 04/12/2021.  Advanced directives: Advance directive discussed with you today. Even though you declined this today, please call our office should you change your mind, and we can give you the proper paperwork for you to fill out.   Conditions/risks identified: KEEP UP THE GOOD WORK! Aim for 30 minutes of exercise or brisk walking each day, drink 6-8 glasses of water and eat lots of fruits and vegetables.   Next appointment: Follow up in one year for your annual wellness visit 2024   Preventive Care 40-64 Years, Male Preventive care refers to lifestyle choices and visits with your health care provider that can promote health and wellness. What does preventive care include? A yearly physical exam. This is also called an annual well check. Dental exams once or twice a year. Routine eye exams. Ask your health care provider how often you should have your eyes checked. Personal lifestyle choices, including: Daily care of your teeth and gums. Regular physical activity. Eating a healthy diet. Avoiding tobacco and drug use. Limiting alcohol use. Practicing safe sex. Taking low-dose aspirin every day starting at age 83. What  happens during an annual well check? The services and screenings done by your health care provider during your annual well check will depend on your age, overall health, lifestyle risk factors, and family history of disease. Counseling  Your health care provider may ask you questions about your: Alcohol use. Tobacco use. Drug use. Emotional well-being. Home and relationship well-being. Sexual activity. Eating habits. Work and work Astronomer. Screening  You may have the following tests or measurements: Height, weight, and BMI. Blood pressure. Lipid and cholesterol levels. These may be checked every 5 years, or more frequently if you are over 21 years old. Skin check. Lung cancer screening. You may have this screening every year starting at age 13 if you have a 30-pack-year history of smoking and currently smoke or have quit within the past 15 years. Fecal occult blood test (FOBT) of the stool. You may have this test every year starting at age 24. Flexible sigmoidoscopy or colonoscopy. You may have a sigmoidoscopy every 5 years or a colonoscopy every 10 years starting at age 73. Prostate cancer screening. Recommendations will vary depending on your family history and other risks. Hepatitis C blood test. Hepatitis B blood test. Sexually transmitted disease (STD) testing. Diabetes screening. This is done by checking your blood sugar (glucose) after you have not eaten for a while (fasting). You may have this done every 1-3 years. Discuss your test results, treatment options, and if necessary, the need for more tests with your health care provider. Vaccines  Your health care provider may recommend certain vaccines, such as: Influenza vaccine. This is recommended every year. Tetanus, diphtheria, and acellular pertussis (Tdap, Td) vaccine. You may need a Td booster every 10 years. Zoster  vaccine. You may need this after age 50. Pneumococcal 13-valent conjugate (PCV13) vaccine. You may need  this if you have certain conditions and have not been vaccinated. Pneumococcal polysaccharide (PPSV23) vaccine. You may need one or two doses if you smoke cigarettes or if you have certain conditions. Talk to your health care provider about which screenings and vaccines you need and how often you need them. This information is not intended to replace advice given to you by your health care provider. Make sure you discuss any questions you have with your health care provider. Document Released: 06/11/2015 Document Revised: 02/02/2016 Document Reviewed: 03/16/2015 Elsevier Interactive Patient Education  2017 [employer].  Fall Prevention in the Home Falls can cause injuries. They can happen to people of all ages. There are many things you can do to make your home safe and to help prevent falls. What can I do on the outside of my home? Regularly fix the edges of walkways and driveways and fix any cracks. Remove anything that might make you trip as you walk through a door, such as a raised step or threshold. Trim any bushes or trees on the path to your home. Use bright outdoor lighting. Clear any walking paths of anything that might make someone trip, such as rocks or tools. Regularly check to see if handrails are loose or broken. Make sure that both sides of any steps have handrails. Any raised decks and porches should have guardrails on the edges. Have any leaves, snow, or ice cleared regularly. Use sand or salt on walking paths during winter. Clean up any spills in your garage right away. This includes oil or grease spills. What can I do in the bathroom? Use night lights. Install grab bars by the toilet and in the tub and shower. Do not use towel bars as grab bars. Use non-skid mats or decals in the tub or shower. If you need to sit down in the shower, use a plastic, non-slip stool. Keep the floor dry. Clean up any water that spills on the floor as soon as it happens. Remove soap buildup  in the tub or shower regularly. Attach bath mats securely with double-sided non-slip rug tape. Do not have throw rugs and other things on the floor that can make you trip. What can I do in the bedroom? Use night lights. Make sure that you have a light by your bed that is easy to reach. Do not use any sheets or blankets that are too big for your bed. They should not hang down onto the floor. Have a firm chair that has side arms. You can use this for support while you get dressed. Do not have throw rugs and other things on the floor that can make you trip. What can I do in the kitchen? Clean up any spills right away. Avoid walking on wet floors. Keep items that you use a lot in easy-to-reach places. If you need to reach something above you, use a strong step stool that has a grab bar. Keep electrical cords out of the way. Do not use floor polish or wax that makes floors slippery. If you must use wax, use non-skid floor wax. Do not have throw rugs and other things on the floor that can make you trip. What can I do with my stairs? Do not leave any items on the stairs. Make sure that there are handrails on both sides of the stairs and use them. Fix handrails that are broken or loose. Make  sure that handrails are as long as the stairways. Check any carpeting to make sure that it is firmly attached to the stairs. Fix any carpet that is loose or worn. Avoid having throw rugs at the top or bottom of the stairs. If you do have throw rugs, attach them to the floor with carpet tape. Make sure that you have a light switch at the top of the stairs and the bottom of the stairs. If you do not have them, ask someone to add them for you. What else can I do to help prevent falls? Wear shoes that: Do not have high heels. Have rubber bottoms. Are comfortable and fit you well. Are closed at the toe. Do not wear sandals. If you use a stepladder: Make sure that it is fully opened. Do not climb a closed  stepladder. Make sure that both sides of the stepladder are locked into place. Ask someone to hold it for you, if possible. Clearly mark and make sure that you can see: Any grab bars or handrails. First and last steps. Where the edge of each step is. Use tools that help you move around (mobility aids) if they are needed. These include: Canes. Walkers. Scooters. Crutches. Turn on the lights when you go into a dark area. Replace any light bulbs as soon as they burn out. Set up your furniture so you have a clear path. Avoid moving your furniture around. If any of your floors are uneven, fix them. If there are any pets around you, be aware of where they are. Review your medicines with your doctor. Some medicines can make you feel dizzy. This can increase your chance of falling. Ask your doctor what other things that you can do to help prevent falls. This information is not intended to replace advice given to you by your health care provider. Make sure you discuss any questions you have with your health care provider. Document Released: 03/11/2009 Document Revised: 10/21/2015 Document Reviewed: 06/19/2014 Elsevier Interactive Patient Education  2017 [employer].

## 2021-06-09 ENCOUNTER — Telehealth: Payer: Self-pay | Admitting: Family Medicine

## 2021-06-09 DIAGNOSIS — F331 Major depressive disorder, recurrent, moderate: Secondary | ICD-10-CM

## 2021-06-09 DIAGNOSIS — F411 Generalized anxiety disorder: Secondary | ICD-10-CM

## 2021-06-09 MED ORDER — DESVENLAFAXINE SUCCINATE ER 25 MG PO TB24: 25.0000 mg | ORAL_TABLET | Freq: Every day | ORAL | 2 refills | Status: DC

## 2021-06-09 NOTE — Telephone Encounter (Signed)
Lmtcb.

## 2021-06-09 NOTE — Telephone Encounter (Signed)
Please let patient know I have received his GeneSight test results and recommend starting Pristiq. I have already sent it to pharmacy. Is he still taking the Prozac? If yes, we will need to wean off of it by decreasing to 20 mg daily x2 weeks, then every other day x2 weeks, then stop. Let me know if he is so I can send the 20 mg capsules to the pharmacy as he has 40 mg for his last prescription.

## 2021-06-13 NOTE — Telephone Encounter (Signed)
Lmtcb.

## 2021-06-20 NOTE — Telephone Encounter (Signed)
Spoke with patient.  He said he has been off Prozac x 3 weeks.  Aware to start Pristiq at the pharmacy.

## 2021-07-15 ENCOUNTER — Other Ambulatory Visit: Payer: Self-pay | Admitting: Family Medicine

## 2021-07-15 DIAGNOSIS — F330 Major depressive disorder, recurrent, mild: Secondary | ICD-10-CM

## 2021-07-15 DIAGNOSIS — F411 Generalized anxiety disorder: Secondary | ICD-10-CM

## 2021-07-29 ENCOUNTER — Ambulatory Visit (INDEPENDENT_AMBULATORY_CARE_PROVIDER_SITE_OTHER): Payer: Medicare Other | Admitting: Family Medicine

## 2021-07-29 DIAGNOSIS — F411 Generalized anxiety disorder: Secondary | ICD-10-CM

## 2021-07-29 DIAGNOSIS — F331 Major depressive disorder, recurrent, moderate: Secondary | ICD-10-CM

## 2021-07-29 DIAGNOSIS — Z23 Encounter for immunization: Secondary | ICD-10-CM

## 2021-07-29 MED ORDER — DESVENLAFAXINE SUCCINATE ER 25 MG PO TB24: 25.0000 mg | ORAL_TABLET | Freq: Every day | ORAL | 1 refills | Status: DC

## 2021-07-29 NOTE — Progress Notes (Signed)
Assessment & Plan:  1. Moderate episode of recurrent major depressive disorder (HCC) Well controlled on current regimen.  - desvenlafaxine (PRISTIQ) 25 MG 24 hr tablet; Take 1 tablet (25 mg total) by mouth daily.  Dispense: 90 tablet; Refill: 1  2. Generalized anxiety disorder Well controlled on current regimen.  - desvenlafaxine (PRISTIQ) 25 MG 24 hr tablet; Take 1 tablet (25 mg total) by mouth daily.  Dispense: 90 tablet; Refill: 1   Return in about 6 months (around 01/29/2022) for annual physical.  Hendricks Limes, MSN, APRN, FNP-C [facility]  Subjective:    [name], male    DOB: [birth year], 61 y.o.   MRN: [id number]  Patient Care Team: Loman Brooklyn, FNP as PCP - [facility], [facility] Francella Solian, MD as Referring Physician (Cardiology)   Chief Complaint:  Chief Complaint  Patient presents with   Anxiety   Depression    2 month follow up. Patient states he is doing better.    HPI: Cameron Ware is a 61 y.o. male presenting on 07/29/2021 for Anxiety and Depression (2 month follow up. Patient states he is doing better.)  Anxiety/Depression: taking Pristiq. Previously failed treatment with Celexa and Prozac. Gene Sight testing completed previously.  Patient feels he is doing okay and does not need a change in dosage.  Depression screen [facility] 2/9 07/29/2021 06/08/2021 06/01/2021  Decreased Interest 0 1 3  Down, Depressed, Hopeless 0 1 3  PHQ - 2 Score 0 2 6  Altered sleeping 0 1 0  Tired, decreased energy 1 0 3  Change in appetite 0 0 3  Feeling bad or failure about yourself  1 0 3  Trouble concentrating 0 0 3  Moving slowly or fidgety/restless 0 0 0  Suicidal thoughts 0 0 0  PHQ-9 Score 2 3 18   Difficult doing work/chores Not difficult at all Somewhat difficult Not difficult at all  Some recent data might be hidden   GAD 7 : Generalized Anxiety Score 07/29/2021 06/01/2021 04/15/2021  10/13/2020  Nervous, Anxious, on Edge 3 3 3 3   Control/stop worrying 1 3 3 3   Worry too much - different things 1 3 3 3   Trouble relaxing 0 3 3 1   Restless 0 3 3 1   Easily annoyed or irritable 1 3 3 1   Afraid - awful might happen 1 3 3 1   Total GAD 7 Score 7 21 21 13   Anxiety Difficulty Not difficult at all Not difficult at all Somewhat difficult Not difficult at all    New complaints: None   Social history:  Relevant past medical, surgical, family and social history reviewed and updated as indicated. Interim medical history since our last visit reviewed.  Allergies and medications reviewed and updated.  DATA REVIEWED: CHART IN EPIC  ROS: Negative unless specifically indicated above in HPI.    Current Outpatient Medications:    aspirin EC 325 MG tablet, Take 325 mg by mouth daily., Disp: , Rfl:    atorvastatin (LIPITOR) 80 MG tablet, Take 1 tablet (80 mg total) by mouth daily at 6 PM., Disp: 90 tablet, Rfl: 1   clopidogrel (PLAVIX) 75 MG tablet, Take 1 tablet (75 mg total) by mouth daily., Disp: 90 tablet, Rfl: 1   desvenlafaxine (PRISTIQ) 25 MG 24 hr tablet, Take 1 tablet (25 mg total) by mouth daily., Disp: 30 tablet, Rfl: 2   dicyclomine (BENTYL) 10 MG capsule, Take 1 capsule (10 mg total)  by mouth 3 (three) times daily before meals., Disp: 270 capsule, Rfl: 1   meloxicam (MOBIC) 7.5 MG tablet, Take 1 tablet (7.5 mg total) by mouth daily., Disp: 90 tablet, Rfl: 1   pantoprazole (PROTONIX) 40 MG tablet, Take 1 tablet (40 mg total) by mouth daily., Disp: 90 tablet, Rfl: 1   potassium chloride (KLOR-CON) 10 MEQ tablet, Take 2 tablets (20 mEq total) by mouth 2 (two) times daily., Disp: 360 tablet, Rfl: 1   FLUoxetine (PROZAC) 40 MG capsule, TAKE 1 CAPSULE DAILY (Patient not taking: Reported on 07/29/2021), Disp: 30 capsule, Rfl: 2   No Known Allergies Past Medical History:  Diagnosis Date   Anorexia 11/29/2010   Arthritis    Decreased vision    Depression    Dyspnea on  exertion    Generalized headaches    High cholesterol 01/30/2017   Hypertension    Peripheral vascular disease ([place])    Stroke [facility])     Past Surgical History:  Procedure Laterality Date   ABDOMINAL AORTAGRAM N/A 08/22/2011   Procedure: ABDOMINAL Maxcine Ham;  Surgeon: Serafina Mitchell, MD;  Location: [facility] C[facility] LAB;  Service: Cardiovascular;  Laterality: N/A;   aortogram     COLONOSCOPY N/A 06/07/2017   Procedure: COLONOSCOPY;  Surgeon: Rogene Houston, MD;  [facility] ENDO SUITE;  Service: Endoscopy;  Laterality: N/A;  150   PERCUTANEOUS STENT INTERVENTION Bilateral 08/22/2011   Procedure: PERCUTANEOUS STENT INTERVENTION;  Surgeon: Serafina Mitchell, MD;  Location: [facility] CATH LAB;  Service: Cardiovascular;  Laterality: Bilateral;   Stents  August 22, 2011   Bilateral iliac PTA and stenting    Social History   Socioeconomic History   Marital status: Single    Spouse name: Not on file   Number of children: Not on file   Years of education: Not on file   Highest education level: Not on file  Occupational History   Not on file  Tobacco Use   Smoking status: Former    Packs/day: 1.00    Years: 40.00    Pack years: 40.00    Types: Cigarettes    Quit date: 08/04/2012    Years since quitting: 8.9   Smokeless tobacco: Never   Tobacco comments:    quit smoking 5 yrs ago. smoked one cigarette yesterday (09/25/2018)  Vaping Use   Vaping Use: Former  Substance and Sexual Activity   Alcohol use: Yes    Comment: wine every other day; 2-3 a day on 02/20/2019   Drug use: Yes    Types: Marijuana   Sexual activity: Not on file  Other Topics Concern   Not on file  Social History Narrative   Lives with mother, Cameron Ware, who is currently under Hospice care-06/08/2021.   Brother and sister in law live nearby and helps with pt's mom.   Social Determinants of Health   Financial Resource Strain: Low Risk    Difficulty of Paying Living Expenses: Not hard at all  Food Insecurity: No Food Insecurity    Worried About Charity fundraiser in the Last Year: Never true   [place] in the Last Year: Never true  Transportation Needs: No Transportation Needs   Lack of Transportation (Medical): No   Lack of Transportation (Non-Medical): No  Physical Activity: Insufficiently Active   Days of Exercise per Week: 4 days   Minutes of Exercise per Session: 30 min  Stress: Stress Concern Present   Feeling of Stress : To some extent  Social Connections: Moderately Integrated   Frequency of Communication with Friends and Family: More than three times a week   Frequency of Social Gatherings with Friends and Family: More than three times a week   Attends Religious Services: 1 to 4 times per year   Active Member of [employer] or Organizations: Yes   Attends Archivist Meetings: 1 to 4 times per year   Marital Status: Never married  Human resources officer Violence: Not At Risk   Fear of Current or Ex-Partner: No   Emotionally Abused: No   Physically Abused: No   Sexually Abused: No        Objective:    BP 128/69    Pulse 83    Temp (!) 97 F (36.1 C) (Temporal)    Ht 5' (1.524 m)    Wt 155 lb (70.3 kg)    SpO2 96%    BMI 30.27 kg/m   Wt Readings from Last 3 Encounters:  07/29/21 155 lb (70.3 kg)  06/08/21 150 lb (68 kg)  06/01/21 150 lb 3.2 oz (68.1 kg)    Physical Exam Vitals reviewed.  Constitutional:      General: He is not in acute distress.    Appearance: Normal appearance. He is normal weight. He is not ill-appearing, toxic-appearing or diaphoretic.  HENT:     Head: Normocephalic and atraumatic.  Eyes:     General: No scleral icterus.       Right eye: No discharge.        Left eye: No discharge.     Conjunctiva/sclera: Conjunctivae normal.  Cardiovascular:     Rate and Rhythm: Normal rate and regular rhythm.     Heart sounds: Normal heart sounds. No murmur heard.   No friction rub. No gallop.  Pulmonary:     Effort: Pulmonary effort is normal. No respiratory distress.      Breath sounds: Normal breath sounds. No stridor. No wheezing, rhonchi or rales.  Musculoskeletal:        General: Normal range of motion.     Cervical back: Normal range of motion.  Skin:    General: Skin is warm and dry.  Neurological:     Mental Status: He is alert and oriented to person, place, and time. Mental status is at baseline.  Psychiatric:        Mood and Affect: Mood normal.        Behavior: Behavior normal.        Thought Content: Thought content normal.        Judgment: Judgment normal.    Lab Results  Component Value Date   TSH 2.470 10/13/2020   Lab Results  Component Value Date   WBC 7.3 04/15/2021   HGB 16.1 04/15/2021   HCT 47.4 04/15/2021   MCV 91 04/15/2021   PLT 277 04/15/2021   Lab Results  Component Value Date   NA 139 04/15/2021   K 4.7 04/15/2021   CO2 23 04/15/2021   GLUCOSE 99 04/15/2021   BUN 13 04/15/2021   CREATININE 0.86 04/15/2021   BILITOT 0.3 04/15/2021   ALKPHOS 145 (H) 04/15/2021   AST 17 04/15/2021   ALT 14 04/15/2021   PROT 6.7 04/15/2021   ALBUMIN 4.4 04/15/2021   CALCIUM 10.2 04/15/2021   ANIONGAP 12 09/25/2018   EGFR 98 04/15/2021   Lab Results  Component Value Date   CHOL 159 04/15/2021   Lab Results  Component Value Date   HDL 55 04/15/2021   Lab  Results  Component Value Date   LDLCALC 86 04/15/2021   Lab Results  Component Value Date   TRIG 96 04/15/2021   Lab Results  Component Value Date   CHOLHDL 2.9 04/15/2021   No results found for: HGBA1C

## 2021-08-01 ENCOUNTER — Encounter: Payer: Self-pay | Admitting: Family Medicine

## 2021-08-01 DIAGNOSIS — F331 Major depressive disorder, recurrent, moderate: Secondary | ICD-10-CM | POA: Insufficient documentation

## 2021-08-02 ENCOUNTER — Other Ambulatory Visit: Payer: Self-pay

## 2021-08-02 DIAGNOSIS — I739 Peripheral vascular disease, unspecified: Secondary | ICD-10-CM

## 2021-08-18 ENCOUNTER — Ambulatory Visit: Payer: Medicare Other

## 2021-08-18 ENCOUNTER — Encounter (HOSPITAL_COMMUNITY): Payer: Medicare Other

## 2021-08-18 ENCOUNTER — Inpatient Hospital Stay (HOSPITAL_COMMUNITY): Admission: RE | Admit: 2021-08-18 | Payer: Medicare Other | Source: Ambulatory Visit

## 2021-08-18 NOTE — Progress Notes (Deleted)
[facility] [place] ?HISTORY AND PHYSICAL  ? ?History of Present Illness:  Patient is a 61 y.o. year old male who presents for evaluation of PAD with history of Bilateral common iliac stenosis.  S/P CIA stent by Dr. Myra Gianotti on 08/22/2011.  ? He denies claudication with ambulation, rest pain or non healing wounds.  He was last seen in our office on 08/25/2019 and at that time he had palpable pedal pulses with stable ABI and patent stent CIA bilaterally.  He is here today for f/u with repeat ABI and CIA duplex.   ? ?He is medically managed on ASA, Plavix and Statin daily.   ?   ? ? ? ?Past Medical History:  ?Diagnosis Date  ? Anorexia 11/29/2010  ? Arthritis   ? Decreased vision   ? Depression   ? Dyspnea on exertion   ? Generalized headaches   ? High cholesterol 01/30/2017  ? Hypertension   ? Peripheral vascular disease (HCC)   ? Stroke [facility])   ? ? ?Past Surgical History:  ?Procedure Laterality Date  ? ABDOMINAL AORTAGRAM N/A 08/22/2011  ? Procedure: ABDOMINAL AORTAGRAM;  Surgeon: Nada Libman, MD;  Location: [facility] CATH LAB;  Service: Cardiovascular;  Laterality: N/A;  ? aortogram    ? COLONOSCOPY N/A 06/07/2017  ? Procedure: COLONOSCOPY;  Surgeon: Malissa Hippo, MD;  Location[facility] ENDO [facility];  Service: Endoscopy;  Laterality: N/A;  150  ? PERCUTANEOUS STENT INTERVENTION Bilateral 08/22/2011  ? Procedure: PERCUTANEOUS STENT INTERVENTION;  Surgeon: Nada Libman, MD;  Location: [facility] CATH LAB;  Service: Cardiovascular;  Laterality: Bilateral;  ? Stents  August 22, 2011  ? Bilateral iliac PTA and stenting  ? ? ?ROS:  ? ?General:  No weight loss, Fever, chills ? ?HEENT: No recent headaches, no nasal bleeding, no visual changes, no sore throat ? ?Neurologic: No dizziness, blackouts, seizures. No recent symptoms of stroke or mini- stroke. No recent episodes of slurred speech, or temporary blindness. ? ?Cardiac: No recent episodes of chest pain/pressure, no shortness of breath at rest.  No shortness of  breath with exertion.  Denies history of atrial fibrillation or irregular heartbeat ? ?Vascular: No history of rest pain in feet.  No history of claudication.  No history of non-healing ulcer, No history of DVT  ? ?Pulmonary: No home oxygen, no productive cough, no hemoptysis,  No asthma or wheezing ? ?Musculoskeletal:  [ ]  Arthritis, [ ]  Low back pain,  [ ]  Joint pain ? ?Hematologic:No history of hypercoagulable state.  No history of easy bleeding.  No history of anemia ? ?Gastrointestinal: No hematochezia or melena,  No gastroesophageal reflux, no trouble swallowing ? ?Urinary: [ ]  chronic Kidney disease, [ ]  on HD - [ ]  MWF or [ ]  TTHS, [ ]  Burning with urination, [ ]  Frequent urination, [ ]  Difficulty urinating;  ? ?Skin: No rashes ? ?Psychological: No history of anxiety,  No history of depression ? ?Social History ?Social History  ? ?Tobacco Use  ? Smoking status: Former  ?  Packs/day: 1.00  ?  Years: 40.00  ?  Pack years: 40.00  ?  Types: Cigarettes  ?  Quit date: 08/04/2012  ?  Years since quitting: 9.0  ? Smokeless tobacco: Never  ? Tobacco comments:  ?  quit smoking 5 yrs ago. smoked one cigarette yesterday (09/25/2018)  ?Vaping Use  ? Vaping Use: Former  ?Substance Use Topics  ? Alcohol use: Yes  ?  Comment: wine every other  day; 2-3 a day on 02/20/2019  ? Drug use: Yes  ?  Types: Marijuana  ? ? ?Family History ?Family History  ?Problem Relation Age of Onset  ? Heart disease Mother   ? Diabetes Father   ? Colon cancer Father   ? ? ?Allergies ? ?No Known Allergies ? ? ?Current Outpatient Medications  ?Medication Sig Dispense Refill  ? aspirin EC 325 MG tablet Take 325 mg by mouth daily.    ? atorvastatin (LIPITOR) 80 MG tablet Take 1 tablet (80 mg total) by mouth daily at 6 PM. 90 tablet 1  ? clopidogrel (PLAVIX) 75 MG tablet Take 1 tablet (75 mg total) by mouth daily. 90 tablet 1  ? desvenlafaxine (PRISTIQ) 25 MG 24 hr tablet Take 1 tablet (25 mg total) by mouth daily. 90 tablet 1  ? dicyclomine (BENTYL) 10 MG  capsule Take 1 capsule (10 mg total) by mouth 3 (three) times daily before meals. 270 capsule 1  ? meloxicam (MOBIC) 7.5 MG tablet Take 1 tablet (7.5 mg total) by mouth daily. 90 tablet 1  ? pantoprazole (PROTONIX) 40 MG tablet Take 1 tablet (40 mg total) by mouth daily. 90 tablet 1  ? potassium chloride (KLOR-CON) 10 MEQ tablet Take 2 tablets (20 mEq total) by mouth 2 (two) times daily. 360 tablet 1  ? ?No current facility-administered medications for this visit.  ? ? ?Physical Examination ? ?There were no vitals filed for this visit. ? ?There is no height or weight on file to calculate BMI. ? ?General:  Alert and oriented, no acute distress ?HEENT: Normal ?Neck: No bruit or JVD ?Pulmonary: Clear to auscultation bilaterally ?Cardiac: Regular Rate and Rhythm without murmur ?Abdomen: Soft, non-tender, non-distended, no mass, no scars ?Skin: No rash ?Extremity Pulses:  2+ radial, brachial, femoral, dorsalis pedis, posterior tibial pulses bilaterally ?Musculoskeletal: No deformity or edema  ?Neurologic: Upper and lower extremity motor 5/5 and symmetric ? ?DATA: *** ? ? ?ASSESSMENT: *** ? ? ?PLAN: *** ? ?Mosetta Pigeon ?PA-C ?[facility] [place] ?Office: [phone] ? ?MD in clinic [place] ?

## 2021-10-12 ENCOUNTER — Ambulatory Visit (INDEPENDENT_AMBULATORY_CARE_PROVIDER_SITE_OTHER): Payer: Medicare Other | Admitting: Vascular Surgery

## 2021-10-12 ENCOUNTER — Ambulatory Visit (INDEPENDENT_AMBULATORY_CARE_PROVIDER_SITE_OTHER)
Admission: RE | Admit: 2021-10-12 | Discharge: 2021-10-12 | Disposition: A | Discharge status: Home / Self Care | Payer: Medicare Other | Source: Ambulatory Visit | Attending: Vascular Surgery | Admitting: Vascular Surgery

## 2021-10-12 ENCOUNTER — Encounter: Payer: Self-pay | Admitting: Vascular Surgery

## 2021-10-12 ENCOUNTER — Ambulatory Visit (HOSPITAL_COMMUNITY)
Admission: RE | Admit: 2021-10-12 | Discharge: 2021-10-12 | Disposition: A | Discharge status: Home / Self Care | Payer: Medicare Other | Source: Ambulatory Visit | Attending: Vascular Surgery | Admitting: Vascular Surgery

## 2021-10-12 VITALS — BP 156/84 | HR 62 | Temp 98.1°F | Resp 20 | Ht 60.0 in | Wt 153.0 lb

## 2021-10-12 DIAGNOSIS — I739 Peripheral vascular disease, unspecified: Secondary | ICD-10-CM | POA: Diagnosis not present

## 2021-10-12 NOTE — Progress Notes (Signed)
? ?[name], male   DOB: Jun 16, 1960, 61 y.o.   MRN: [id number] ? ?Reason for Consult: Follow-up ?  ?Referred by Loman Brooklyn, FNP ? ?Subjective:  ?   ?HPI: ? ?Cameron Ware is a 61 y.o. male history of bilateral common iliac artery stents in 2013.  He had quit smoking for many years.  He continues to take aspirin, statin and Plavix daily.  Unfortunately he has returned to smoking a few cigarettes a day.  He is also been evaluated here for chronic mesenteric ischemia in the past but he had gained some weight at last visit.  Now states that his abdominal pain has been diagnosed with stomach issues.  He is able to walk at least 200 feet on flat ground hills and may be less so but he does not have any lifestyle limitation at this point.  He denies any tissue loss or ulceration. ? ?Past Medical History:  ?Diagnosis Date  ? Anorexia 11/29/2010  ? Arthritis   ? Decreased vision   ? Depression   ? Dyspnea on exertion   ? Generalized headaches   ? High cholesterol 01/30/2017  ? Hypertension   ? Peripheral vascular disease ([place])   ? Stroke [facility])   ? ?Family History  ?Problem Relation Age of Onset  ? Heart disease Mother   ? Diabetes Father   ? Colon cancer Father   ? ?Past Surgical History:  ?Procedure Laterality Date  ? ABDOMINAL AORTAGRAM N/A 08/22/2011  ? Procedure: ABDOMINAL AORTAGRAM;  Surgeon: Serafina Mitchell, MD;  Location: [facility] CATH LAB;  Service: Cardiovascular;  Laterality: N/A;  ? aortogram    ? COLONOSCOPY N/A 06/07/2017  ? Procedure: COLONOSCOPY;  Surgeon: Rogene Houston, MD;  [facility] ENDO [facility];  Service: Endoscopy;  Laterality: N/A;  150  ? PERCUTANEOUS STENT INTERVENTION Bilateral 08/22/2011  ? Procedure: PERCUTANEOUS STENT INTERVENTION;  Surgeon: Serafina Mitchell, MD;  Location: [facility] CATH LAB;  Service: Cardiovascular;  Laterality: Bilateral;  ? Stents  August 22, 2011  ? Bilateral iliac PTA and stenting  ? ? ?Short Social History:  ?Social History  ? ?Tobacco Use  ?  Smoking status: Former  ?  Packs/day: 1.00  ?  Years: 40.00  ?  Pack years: 40.00  ?  Types: Cigarettes  ?  Quit date: 08/04/2012  ?  Years since quitting: 9.1  ? Smokeless tobacco: Never  ? Tobacco comments:  ?  quit smoking 5 yrs ago. smoked one cigarette yesterday (09/25/2018)  ?Substance Use Topics  ? Alcohol use: Yes  ?  Comment: wine every other day; 2-3 a day on 02/20/2019  ? ? ?No Known Allergies ? ?Current Outpatient Medications  ?Medication Sig Dispense Refill  ? aspirin EC 325 MG tablet Take 325 mg by mouth daily.    ? atorvastatin (LIPITOR) 80 MG tablet Take 1 tablet (80 mg total) by mouth daily at 6 PM. 90 tablet 1  ? clopidogrel (PLAVIX) 75 MG tablet Take 1 tablet (75 mg total) by mouth daily. 90 tablet 1  ? desvenlafaxine (PRISTIQ) 25 MG 24 hr tablet Take 1 tablet (25 mg total) by mouth daily. 90 tablet 1  ? dicyclomine (BENTYL) 10 MG capsule Take 1 capsule (10 mg total) by mouth 3 (three) times daily before meals. 270 capsule 1  ? FLUoxetine (PROZAC) 40 MG capsule Take 40 mg by mouth daily.    ? meloxicam (MOBIC) 7.5 MG tablet Take 1 tablet (7.5 mg total) by mouth  daily. 90 tablet 1  ? pantoprazole (PROTONIX) 40 MG tablet Take 1 tablet (40 mg total) by mouth daily. 90 tablet 1  ? potassium chloride (KLOR-CON) 10 MEQ tablet Take 2 tablets (20 mEq total) by mouth 2 (two) times daily. 360 tablet 1  ? ?No current facility-administered medications for this visit.  ? ? ?Review of Systems  ?Constitutional:  Constitutional negative. ?HENT: HENT negative.  ?Eyes: Eyes negative.  ?Respiratory: Positive for shortness of breath.  ?Cardiovascular: Positive for claudication.  ?GI: Gastrointestinal negative.  ?Musculoskeletal: Musculoskeletal negative.  ?Skin: Skin negative.  ?Neurological: Neurological negative. ?Hematologic: Hematologic/lymphatic negative.  ?Psychiatric: Psychiatric negative.   ? ?   ?Objective:  ?Objective  ? ?Vitals:  ? 10/12/21 1146  ?BP: (!) 156/84  ?Pulse: 62  ?Resp: 20  ?Temp: 98.1 ?F (36.7 ?C)   ?SpO2: 98%  ?Weight: 153 lb (69.4 kg)  ?Height: 5' (1.524 m)  ? ?Body mass index is 29.88 kg/m?. ? ?Physical Exam ?HENT:  ?   Head: Normocephalic.  ?   Nose: Nose normal.  ?Eyes:  ?   Pupils: Pupils are equal, round, and reactive to light.  ?Neck:  ?   Vascular: No carotid bruit.  ?Cardiovascular:  ?   Rate and Rhythm: Normal rate.  ?   Heart sounds: Normal heart sounds.  ?   Comments: I cannot reliably feel femoral pulses today. ?Pulmonary:  ?   Effort: Pulmonary effort is normal.  ?Abdominal:  ?   General: Abdomen is flat.  ?   Palpations: Abdomen is soft.  ?Musculoskeletal:     ?   General: Normal range of motion.  ?   Right lower leg: No edema.  ?   Left lower leg: No edema.  ?Skin: ?   General: Skin is warm and dry.  ?   Capillary Refill: Capillary refill takes 2 to 3 seconds.  ?Neurological:  ?   General: No focal deficit present.  ?   Mental Status: He is alert.  ?Psychiatric:     ?   Mood and Affect: Mood normal.     ?   Behavior: Behavior normal.  ? ? ?Data: ?ABI Findings:  ?+---------+------------------+-----+----------+--------+  ?Right    Rt Pressure (mmHg)IndexWaveform  Comment   ?+---------+------------------+-----+----------+--------+  ?Brachial 145                                        ?+---------+------------------+-----+----------+--------+  ?PTA      128               0.82 monophasic          ?+---------+------------------+-----+----------+--------+  ?DP       71                0.45 monophasic          ?+---------+------------------+-----+----------+--------+  ?Great Toe80                0.51 Abnormal            ?+---------+------------------+-----+----------+--------+  ? ?+---------+------------------+-----+----------+-------+  ?Left     Lt Pressure (mmHg)IndexWaveform  Comment  ?+---------+------------------+-----+----------+-------+  ?Brachial 157                                       ?+---------+------------------+-----+----------+-------+  ?PTA       126  0.80 monophasic         ?+---------+------------------+-----+----------+-------+  ?DP       118               0.75 monophasic         ?+---------+------------------+-----+----------+-------+  ?Great Toe78                0.50                    ?+---------+------------------+-----+----------+-------+  ? ?+-------+-----------+-----------+------------+------------+  ?ABI/TBIToday's ABIToday's TBIPrevious ABIPrevious TBI  ?+-------+-----------+-----------+------------+------------+  ?Right  0.82       0.51       0.86        0.76          ?+-------+-----------+-----------+------------+------------+  ?Left   0.80       0.50       0.95        0.8           ?+-------+-----------+-----------+------------+------------+  ? ?  ?Left ABIs and TBIs appear decreased.  ?   ?Summary:  ?Right: Resting right ankle-brachial index indicates mild right lower  ?extremity arterial disease. The right toe-brachial index is abnormal.  ? ?Left: Resting left ankle-brachial index indicates mild left lower  ?extremity arterial disease. The left toe-brachial index is abnormal.  ? ? ?Abdominal Aorta Findings:  ?+-------------+-------+----------+----------+----------+--------+--------+  ?Location     AP (cm)Trans (cm)PSV (cm/s)Waveform  ThrombusComments  ?+-------------+-------+----------+----------+----------+--------+--------+  ?Distal                        36        biphasic                    ?+-------------+-------+----------+----------+----------+--------+--------+  ?RT CIA Prox                   100       monophasic                  ?+-------------+-------+----------+----------+----------+--------+--------+  ?RT CIA Mid                    73        monophasic                  ?+-------------+-------+----------+----------+----------+--------+--------+  ?RT CIA Distal                 125       monophasic                   ?+-------------+-------+----------+----------+----------+--------+--------+  ?RT EIA Prox                                               NV        ?+-------------+-------+----------+----------+----------+--------+--------+  ?RT EIA Mid                    51                                    ?+-------------+-------+----------+----------+----------+---

## 2021-10-18 ENCOUNTER — Other Ambulatory Visit: Payer: Self-pay | Admitting: Family Medicine

## 2021-10-18 DIAGNOSIS — E7800 Pure hypercholesterolemia, unspecified: Secondary | ICD-10-CM

## 2021-10-18 DIAGNOSIS — K219 Gastro-esophageal reflux disease without esophagitis: Secondary | ICD-10-CM

## 2021-10-18 DIAGNOSIS — R1030 Lower abdominal pain, unspecified: Secondary | ICD-10-CM

## 2021-11-09 ENCOUNTER — Other Ambulatory Visit: Payer: Self-pay | Admitting: Family Medicine

## 2021-11-09 DIAGNOSIS — I739 Peripheral vascular disease, unspecified: Secondary | ICD-10-CM

## 2022-01-31 ENCOUNTER — Encounter: Payer: Self-pay | Admitting: Family Medicine

## 2022-01-31 ENCOUNTER — Ambulatory Visit (INDEPENDENT_AMBULATORY_CARE_PROVIDER_SITE_OTHER): Payer: Medicare Other | Admitting: Family Medicine

## 2022-01-31 VITALS — BP 131/76 | HR 65 | Temp 97.9°F | Resp 20 | Ht 60.0 in | Wt 153.0 lb

## 2022-01-31 DIAGNOSIS — I70213 Atherosclerosis of native arteries of extremities with intermittent claudication, bilateral legs: Secondary | ICD-10-CM

## 2022-01-31 DIAGNOSIS — E7800 Pure hypercholesterolemia, unspecified: Secondary | ICD-10-CM | POA: Diagnosis not present

## 2022-01-31 DIAGNOSIS — Z0001 Encounter for general adult medical examination with abnormal findings: Secondary | ICD-10-CM

## 2022-01-31 DIAGNOSIS — I739 Peripheral vascular disease, unspecified: Secondary | ICD-10-CM

## 2022-01-31 DIAGNOSIS — Z23 Encounter for immunization: Secondary | ICD-10-CM

## 2022-01-31 DIAGNOSIS — F411 Generalized anxiety disorder: Secondary | ICD-10-CM

## 2022-01-31 DIAGNOSIS — F331 Major depressive disorder, recurrent, moderate: Secondary | ICD-10-CM

## 2022-01-31 DIAGNOSIS — R1030 Lower abdominal pain, unspecified: Secondary | ICD-10-CM | POA: Diagnosis not present

## 2022-01-31 DIAGNOSIS — M6208 Separation of muscle (nontraumatic), other site: Secondary | ICD-10-CM

## 2022-01-31 DIAGNOSIS — Z0000 Encounter for general adult medical examination without abnormal findings: Secondary | ICD-10-CM | POA: Diagnosis not present

## 2022-01-31 MED ORDER — FAMOTIDINE 20 MG PO TABS: 20.0000 mg | ORAL_TABLET | Freq: Two times a day (BID) | ORAL | 2 refills | Status: DC

## 2022-01-31 NOTE — Progress Notes (Signed)
Assessment & Plan:  Well adult exam Discussed health benefits of physical activity, and encouraged him to engage in regular exercise appropriate for his age and condition. Preventive health education provided.  Declined colorectal cancer screening, Tdap, and HIV screening.  Immunization History  Administered Date(s) Administered   Influenza, Quadrivalent, Recombinant, Inj, Pf 04/07/2015   Influenza,inj,Quad PF,6+ Mos 03/01/2016, 04/17/2017, 02/20/2019   Influenza,trivalent, recombinat, inj, PF 03/11/2014   Influenza-Unspecified 05/05/2020, 04/12/2021, 04/12/2021   Moderna Covid-19 Vaccine Bivalent Booster 32yr & up 04/12/2021   Moderna Sars-Covid-2 Vaccination 09/08/2019, 10/06/2019, 04/27/2020   Tdap 03/25/2019   Zoster Recombinat (Shingrix) 07/29/2021   Health Maintenance  Topic Date Due   Zoster Vaccines- Shingrix (2 of 2) 09/23/2021   INFLUENZA VACCINE  08/27/2022 (Originally 12/27/2021)   HIV Screening  02/01/2023 (Originally 03/18/1976)   COLONOSCOPY (Pts 45-489yrInsurance coverage will need to be confirmed)  06/08/2027   TETANUS/TDAP  03/24/2029   COVID-19 Vaccine  Completed   Hepatitis C Screening  Completed   HPV VACCINES  Aged Out   - CBC with Differential/Platelet - CMP14+EGFR - Lipid panel  2. Lower abdominal pain Uncontrolled.  Starting famotidine twice daily and referring back to gastroenterology. - famotidine (PEPCID) 20 MG tablet; Take 1 tablet (20 mg total) by mouth 2 (two) times daily.  Dispense: 60 tablet; Refill: 2 - Ambulatory referral to Gastroenterology  3. Atherosclerosis of native artery of both lower extremities with intermittent claudication ([place] by vascular and vein; continue current regimen.  Encouraged smoking cessation. - CMP14+EGFR - Lipid panel  4. Peripheral vascular disease, unspecified (H[place]avanaManaged by vascular and vein; continue current regimen.  Encouraged smoking cessation. - CMP14+EGFR - Lipid panel  5. High  cholesterol Well controlled on current regimen.  - CMP14+EGFR - Lipid panel  6. Generalized anxiety disorder Well controlled on current regimen.  - CMP14+EGFR  7. Moderate episode of recurrent major depressive disorder (HCC) Well controlled on current regimen.  - CMP14+EGFR  8. Diastasis recti Reassurance provided.  Education provided on diastases recti.  Encouraged weight loss and core strengthening exercises.  9. Need for shingles vaccine - Zoster Recombinant (Shingrix )   Follow-up: Return in about 3 months (around 05/02/2022) for follow-up of chronic medication conditions.   BrHendricks LimesMSN, APRN, FNP-C [employer]  Subjective:  Patient ID: Cameron DIMMICKmale    DOB: [birth year]Age: [age].o. MRN: [id number] Care Team: Cameron BrooklynFNP as PCP - General ([facility], [facility] Ta[facility]dd ArbouraFrancella SolianMD as Referring Physician (Cardiology)   CC:  Chief Complaint  Patient presents with   Annual Exam    HPI JeRONNEY HONEYWELLs a [age].o. male who presents today for a complete physical exam. He reports consuming a general diet. The patient does not participate in regular exercise at present. He generally feels fairly well. He reports sleeping well. He does have additional problems to discuss today.   He is concerned that his lower abdominal pain is getting worse. This is chronic pain  just above his pubic bone. The pain is constant.  Describes it as 1 million goldfish nibbling on the insides and squeezing.  States it is very frustrating and aggravating.  States he was told in the past that he had a thin stomach from his smoking and drinking. No cause has been identified through either vascular or GI, CT scans or aortoiliac duplex.  I previously offered a referral to physical medicine and rehabilitation for  conservative treatment/possible injections that he declined.  He has done pain management in the past but  does not like taking pain medicines, so he quit going and quit taking the medication. He remains adamant that he does not want pain medication; he just wants the probable addressed.  Vision:Not within last year - denies any issues Dental:No current dental problems - has dentures  DZH:GDJMEQAS cancer screening and PSA options (with potential risks and benefits of testing vs. not testing) were discussed along with recent recs/guidelines.   Lab Results  Component Value Date   PSA1 1.5 10/13/2020   Lung Cancer Screening with low-dose Chest CT: declined  Hyperlipidemia/PVD: taking atorvastatin, Plavix, and aspirin.  He last saw his vascular specialist on 10/12/2021 at which time they discussed reintervention was an option for his claudication, but only if he is smoke-free.  He recommended continue dual antiplatelet therapy and statin with a follow-up in 1 year with repeat studies.  Anxiety/Depression: Most recently started on Pristiq in January 2023 and does not desire a dose change. Previously failed treatment with Celexa and Prozac.     01/31/2022    3:02 PM 07/29/2021    3:31 PM 06/08/2021    1:21 PM  Depression screen PHQ 2/9  Decreased Interest 0 0 1  Down, Depressed, Hopeless 1 0 1  PHQ - 2 Score 1 0 2  Altered sleeping 0 0 1  Tired, decreased energy 1 1 0  Change in appetite 0 0 0  Feeling bad or failure about yourself  0 1 0  Trouble concentrating 0 0 0  Moving slowly or fidgety/restless 0 0 0  Suicidal thoughts 0 0 0  PHQ-9 Score 2 2 3   Difficult doing work/chores  Not difficult at all Somewhat difficult      01/31/2022    3:02 PM 07/29/2021    3:31 PM 06/01/2021   10:09 AM 04/15/2021   10:37 AM  GAD 7 : Generalized Anxiety Score  Nervous, Anxious, on Edge 1 3 3 3   Control/stop worrying 1 1 3 3   Worry too much - different things 1 1 3 3   Trouble relaxing 0 0 3 3  Restless 0 0 3 3  Easily annoyed or irritable 0 1 3 3   Afraid - awful might happen 0 1 3 3   Total GAD 7 Score 3 7  21 21   Anxiety Difficulty Not difficult at all Not difficult at all Not difficult at all Somewhat difficult    Review of Systems  Constitutional:  Negative for chills, fever, malaise/fatigue and weight loss.  HENT:  Negative for congestion, ear discharge, ear pain, nosebleeds, sinus pain, sore throat and tinnitus.   Eyes:  Negative for blurred vision, double vision, pain, discharge and redness.  Respiratory:  Negative for cough, shortness of breath and wheezing.   Cardiovascular:  Negative for chest pain, palpitations and leg swelling.  Gastrointestinal:  Positive for abdominal pain. Negative for constipation, diarrhea, heartburn, nausea and vomiting.       Abdominal hernia.  Genitourinary:  Negative for dysuria, frequency and urgency.  Musculoskeletal:  Negative for myalgias.  Skin:  Negative for rash.  Neurological:  Negative for dizziness, seizures, weakness and headaches.  Psychiatric/Behavioral:  Negative for depression, substance abuse and suicidal ideas. The patient is not nervous/anxious.      Current Outpatient Medications:    aspirin EC 325 MG tablet, Take 325 mg by mouth daily., Disp: , Rfl:    atorvastatin (LIPITOR) 80 MG tablet, TAKE 1 TABLET DAILY  AT 6 PM, Disp: 90 tablet, Rfl: 1   clopidogrel (PLAVIX) 75 MG tablet, TAKE 1 TABLET DAILY, Disp: 90 tablet, Rfl: 0   desvenlafaxine (PRISTIQ) 25 MG 24 hr tablet, Take 1 tablet (25 mg total) by mouth daily., Disp: 90 tablet, Rfl: 1   dicyclomine (BENTYL) 10 MG capsule, TAKE 1 CAPSULE 3 TIMES A DAY BEFORE MEALS, Disp: 270 capsule, Rfl: 1   FLUoxetine (PROZAC) 40 MG capsule, Take 40 mg by mouth daily., Disp: , Rfl:    meloxicam (MOBIC) 7.5 MG tablet, Take 1 tablet (7.5 mg total) by mouth daily., Disp: 90 tablet, Rfl: 1   pantoprazole (PROTONIX) 40 MG tablet, TAKE 1 TABLET DAILY, Disp: 90 tablet, Rfl: 1   potassium chloride (KLOR-CON) 10 MEQ tablet, TAKE 2 TABLETS 2 TIMES A DAY, Disp: 360 tablet, Rfl: 1  No Known Allergies  Past  Medical History:  Diagnosis Date   Anorexia 11/29/2010   Arthritis    Decreased vision    Depression    Dyspnea on exertion    Generalized headaches    High cholesterol 01/30/2017   Hypertension    Peripheral vascular disease ([place])    Stroke [facility])     Past Surgical History:  Procedure Laterality Date   ABDOMINAL AORTAGRAM N/A 08/22/2011   Procedure: ABDOMINAL Maxcine Ham;  Surgeon: Serafina Mitchell, MD;  Location[facility] CATH LAB;  Service: Cardiovascular;  Laterality: N/A;   aortogram     COLONOSCOPY N/A 06/07/2017   Procedure: COLONOSCOPY;  Surgeon: Rogene Houston, MD;  Location: [facility] ENDO SUITE;  Service: Endoscopy;  Laterality: N/A;  150   PERCUTANEOUS STENT INTERVENTION Bilateral 08/22/2011   Procedure: PERCUTANEOUS STENT INTERVENTION;  Surgeon: Serafina Mitchell, MD;  Location: [facility] CATH LAB;  Service: Cardiovascular;  Laterality: Bilateral;   Stents  August 22, 2011   Bilateral iliac PTA and stenting    Family History  Problem Relation Age of Onset   Heart disease Mother    Diabetes Father    Colon cancer Father     Social History   Socioeconomic History   Marital status: Single    Spouse name: Not on file   Number of children: Not on file   Years of education: Not on file   Highest education level: Not on file  Occupational History   Not on file  Tobacco Use   Smoking status: Former    Packs/day: 1.00    Years: 40.00    Total pack years: 40.00    Types: Cigarettes    Quit date: 08/04/2012    Years since quitting: 9.4   Smokeless tobacco: Never   Tobacco comments:    quit smoking 5 yrs ago. smoked one cigarette yesterday (09/25/2018)  Vaping Use   Vaping Use: Former  Substance and Sexual Activity   Alcohol use: Yes    Comment: wine every other day; 2-3 a day on 02/20/2019   Drug use: Yes    Types: Marijuana   Sexual activity: Not on file  Other Topics Concern   Not on file  Social History Narrative   Lives with mother, Cameron Ware, who is currently under Hospice  care-06/08/2021.   Brother and sister in law live nearby and helps with pt's mom.   Social Determinants of Health   Financial Resource Strain: Low Risk  (06/08/2021)   Overall Financial Resource Strain (CARDIA)    Difficulty of Paying Living Expenses: Not hard at all  Food Insecurity: No Food Insecurity (06/08/2021)   Hunger Vital Sign  Worried About Charity fundraiser in the Last Year: Never true    [place] in the Last Year: Never true  Transportation Needs: No Transportation Needs (06/08/2021)   PRAPARE - Hydrologist (Medical): No    Lack of Transportation (Non-Medical): No  Physical Activity: Insufficiently Active (06/08/2021)   Exercise Vital Sign    Days of Exercise per Week: 4 days    Minutes of Exercise per Session: 30 min  Stress: Stress Concern Present (06/08/2021)   [place]    Feeling of Stress : To some extent  Social Connections: Moderately Integrated (06/08/2021)   Social Connection and Isolation Panel [NHANES]    Frequency of Communication with Friends and Family: More than three times a week    Frequency of Social Gatherings with Friends and Family: More than three times a week    Attends Religious Services: 1 to 4 times per year    Active Member of [employer] or Organizations: Yes    Attends Archivist Meetings: 1 to 4 times per year    Marital Status: Never married  Intimate Partner Violence: Not At Risk (06/08/2021)   Humiliation, Afraid, Rape, and Kick questionnaire    Fear of Current or Ex-Partner: No    Emotionally Abused: No    Physically Abused: No    Sexually Abused: No      Objective:    BP (!) 143/76   Pulse 65   Temp 97.9 F (36.6 C)   Resp 20   Ht 5' (1.524 m)   Wt 153 lb (69.4 kg)   SpO2 98%   BMI 29.88 kg/m   BP Readings from Last 3 Encounters:  01/31/22 131/76  10/12/21 (!) 156/84  07/29/21 128/69   Wt Readings from Last 3  Encounters:  01/31/22 153 lb (69.4 kg)  10/12/21 153 lb (69.4 kg)  07/29/21 155 lb (70.3 kg)    Physical Exam Vitals reviewed.  Constitutional:      General: He is not in acute distress.    Appearance: Normal appearance. He is overweight. He is not ill-appearing, toxic-appearing or diaphoretic.  HENT:     Head: Normocephalic and atraumatic.     Right Ear: Tympanic membrane, ear canal and external ear normal. There is no impacted cerumen.     Left Ear: Tympanic membrane, ear canal and external ear normal. There is no impacted cerumen.     Nose: Nose normal. No congestion or rhinorrhea.     Mouth/Throat:     Mouth: Mucous membranes are moist.     Pharynx: Oropharynx is clear. No oropharyngeal exudate or posterior oropharyngeal erythema.  Eyes:     General: No scleral icterus.       Right eye: No discharge.        Left eye: No discharge.     Conjunctiva/sclera: Conjunctivae normal.     Pupils: Pupils are equal, round, and reactive to light.  Neck:     Vascular: No carotid bruit.  Cardiovascular:     Rate and Rhythm: Normal rate and regular rhythm.     Heart sounds: Normal heart sounds. No murmur heard.    No friction rub. No gallop.  Pulmonary:     Effort: Pulmonary effort is normal. No respiratory distress.     Breath sounds: Normal breath sounds. No stridor. No wheezing, rhonchi or rales.  Abdominal:     General: Abdomen is flat. Bowel sounds  are normal. There is no distension.     Palpations: Abdomen is soft. There is no hepatomegaly, splenomegaly or mass.     Tenderness: There is no abdominal tenderness. There is no guarding or rebound.     Hernia: No hernia is present.  Musculoskeletal:        General: Normal range of motion.     Cervical back: Normal range of motion and neck supple. No rigidity. No muscular tenderness.     Right lower leg: No edema.     Left lower leg: No edema.  Lymphadenopathy:     Cervical: No cervical adenopathy.  Skin:    General: Skin is warm  and dry.     Capillary Refill: Capillary refill takes less than 2 seconds.  Neurological:     General: No focal deficit present.     Mental Status: He is alert and oriented to person, place, and time. Mental status is at baseline.  Psychiatric:        Mood and Affect: Mood normal.        Behavior: Behavior normal.        Thought Content: Thought content normal.        Judgment: Judgment normal.     Lab Results  Component Value Date   TSH 2.470 10/13/2020   Lab Results  Component Value Date   WBC 7.3 04/15/2021   HGB 16.1 04/15/2021   HCT 47.4 04/15/2021   MCV 91 04/15/2021   PLT 277 04/15/2021   Lab Results  Component Value Date   NA 139 04/15/2021   K 4.7 04/15/2021   CO2 23 04/15/2021   GLUCOSE 99 04/15/2021   BUN 13 04/15/2021   CREATININE 0.86 04/15/2021   BILITOT 0.3 04/15/2021   ALKPHOS 145 (H) 04/15/2021   AST 17 04/15/2021   ALT 14 04/15/2021   PROT 6.7 04/15/2021   ALBUMIN 4.4 04/15/2021   CALCIUM 10.2 04/15/2021   ANIONGAP 12 09/25/2018   EGFR 98 04/15/2021   Lab Results  Component Value Date   CHOL 159 04/15/2021   Lab Results  Component Value Date   HDL 55 04/15/2021   Lab Results  Component Value Date   LDLCALC 86 04/15/2021   Lab Results  Component Value Date   TRIG 96 04/15/2021   Lab Results  Component Value Date   CHOLHDL 2.9 04/15/2021   No results found for: "HGBA1C"

## 2022-02-01 ENCOUNTER — Telehealth: Payer: Self-pay | Admitting: Family Medicine

## 2022-02-01 ENCOUNTER — Encounter: Payer: Self-pay | Admitting: Family Medicine

## 2022-02-01 LAB — LIPID PANEL
Chol/HDL Ratio: 2.3 ratio (ref 0.0–5.0)
Cholesterol, Total: 166 mg/dL (ref 100–199)
HDL: 71 mg/dL (ref >39)
LDL Chol Calc (NIH): 80 mg/dL (ref 0–99)
Triglycerides: 80 mg/dL (ref 0–149)
VLDL Cholesterol Cal: 15 mg/dL (ref 5–40)

## 2022-02-01 LAB — CMP14+EGFR
ALT: 17 IU/L (ref 0–44)
AST: 19 IU/L (ref 0–40)
Albumin/Globulin Ratio: 1.8 (ref 1.2–2.2)
Albumin: 4.6 g/dL (ref 3.9–4.9)
Alkaline Phosphatase: 128 IU/L — ABNORMAL HIGH (ref 44–121)
BUN/Creatinine Ratio: 13 (ref 10–24)
BUN: 12 mg/dL (ref 8–27)
Bilirubin Total: 0.5 mg/dL (ref 0.0–1.2)
CO2: 23 mmol/L (ref 20–29)
Calcium: 9.7 mg/dL (ref 8.6–10.2)
Chloride: 101 mmol/L (ref 96–106)
Creatinine, Ser: 0.91 mg/dL (ref 0.76–1.27)
Globulin, Total: 2.5 g/dL (ref 1.5–4.5)
Glucose: 84 mg/dL (ref 70–99)
Potassium: 4.4 mmol/L (ref 3.5–5.2)
Sodium: 141 mmol/L (ref 134–144)
Total Protein: 7.1 g/dL (ref 6.0–8.5)
eGFR: 95 mL/min/{1.73_m2} (ref >59)

## 2022-02-01 LAB — CBC WITH DIFFERENTIAL/PLATELET
Basophils Absolute: 0.1 10*3/uL (ref 0.0–0.2)
Basos: 1 %
EOS (ABSOLUTE): 0.1 10*3/uL (ref 0.0–0.4)
Eos: 1 %
Hematocrit: 44.9 % (ref 37.5–51.0)
Hemoglobin: 15.7 g/dL (ref 13.0–17.7)
Immature Grans (Abs): 0 10*3/uL (ref 0.0–0.1)
Immature Granulocytes: 0 %
Lymphocytes Absolute: 1.9 10*3/uL (ref 0.7–3.1)
Lymphs: 25 %
MCH: 31.8 pg (ref 26.6–33.0)
MCHC: 35 g/dL (ref 31.5–35.7)
MCV: 91 fL (ref 79–97)
Monocytes Absolute: 0.7 10*3/uL (ref 0.1–0.9)
Monocytes: 8 %
Neutrophils Absolute: 5 10*3/uL (ref 1.4–7.0)
Neutrophils: 65 %
Platelets: 315 10*3/uL (ref 150–450)
RBC: 4.94 x10E6/uL (ref 4.14–5.80)
RDW: 12.7 % (ref 11.6–15.4)
WBC: 7.8 10*3/uL (ref 3.4–10.8)

## 2022-02-01 NOTE — Telephone Encounter (Signed)
Yes please

## 2022-02-01 NOTE — Telephone Encounter (Signed)
Pharm aware.  

## 2022-02-01 NOTE — Telephone Encounter (Signed)
Pharmacy needs confirmation on if PCP wants pt to take Famotidine in addition to the pantoprazole that he is already taking.  Please advise and call pharmacy.

## 2022-02-03 ENCOUNTER — Encounter (INDEPENDENT_AMBULATORY_CARE_PROVIDER_SITE_OTHER): Payer: Self-pay | Admitting: *Deleted

## 2022-02-14 ENCOUNTER — Other Ambulatory Visit: Payer: Self-pay | Admitting: Family Medicine

## 2022-02-14 DIAGNOSIS — I739 Peripheral vascular disease, unspecified: Secondary | ICD-10-CM

## 2022-02-28 ENCOUNTER — Other Ambulatory Visit: Payer: Self-pay | Admitting: Family Medicine

## 2022-02-28 DIAGNOSIS — R1030 Lower abdominal pain, unspecified: Secondary | ICD-10-CM

## 2022-03-28 ENCOUNTER — Other Ambulatory Visit: Payer: Self-pay | Admitting: Family Medicine

## 2022-03-28 DIAGNOSIS — F331 Major depressive disorder, recurrent, moderate: Secondary | ICD-10-CM

## 2022-03-28 DIAGNOSIS — F411 Generalized anxiety disorder: Secondary | ICD-10-CM

## 2022-04-17 ENCOUNTER — Encounter (INDEPENDENT_AMBULATORY_CARE_PROVIDER_SITE_OTHER): Payer: Self-pay | Admitting: Gastroenterology

## 2022-04-17 ENCOUNTER — Ambulatory Visit (INDEPENDENT_AMBULATORY_CARE_PROVIDER_SITE_OTHER): Payer: Medicare Other | Admitting: Gastroenterology

## 2022-04-18 ENCOUNTER — Other Ambulatory Visit: Payer: Self-pay | Admitting: Family Medicine

## 2022-04-18 DIAGNOSIS — K219 Gastro-esophageal reflux disease without esophagitis: Secondary | ICD-10-CM

## 2022-04-27 ENCOUNTER — Other Ambulatory Visit: Payer: Self-pay | Admitting: Family Medicine

## 2022-04-27 DIAGNOSIS — R1030 Lower abdominal pain, unspecified: Secondary | ICD-10-CM

## 2022-05-02 ENCOUNTER — Ambulatory Visit (INDEPENDENT_AMBULATORY_CARE_PROVIDER_SITE_OTHER): Payer: Medicare Other | Admitting: Family Medicine

## 2022-05-02 ENCOUNTER — Encounter: Payer: Self-pay | Admitting: Family Medicine

## 2022-05-02 ENCOUNTER — Other Ambulatory Visit: Payer: Self-pay | Admitting: Family Medicine

## 2022-05-02 VITALS — BP 135/73 | HR 66 | Temp 98.5°F | Ht 60.0 in | Wt 150.0 lb

## 2022-05-02 DIAGNOSIS — I70213 Atherosclerosis of native arteries of extremities with intermittent claudication, bilateral legs: Secondary | ICD-10-CM | POA: Diagnosis not present

## 2022-05-02 DIAGNOSIS — I739 Peripheral vascular disease, unspecified: Secondary | ICD-10-CM

## 2022-05-02 DIAGNOSIS — F411 Generalized anxiety disorder: Secondary | ICD-10-CM

## 2022-05-02 DIAGNOSIS — E782 Mixed hyperlipidemia: Secondary | ICD-10-CM | POA: Diagnosis not present

## 2022-05-02 DIAGNOSIS — F1010 Alcohol abuse, uncomplicated: Secondary | ICD-10-CM | POA: Insufficient documentation

## 2022-05-02 DIAGNOSIS — R1033 Periumbilical pain: Secondary | ICD-10-CM

## 2022-05-02 DIAGNOSIS — F330 Major depressive disorder, recurrent, mild: Secondary | ICD-10-CM

## 2022-05-02 DIAGNOSIS — K219 Gastro-esophageal reflux disease without esophagitis: Secondary | ICD-10-CM | POA: Diagnosis not present

## 2022-05-02 DIAGNOSIS — R1030 Lower abdominal pain, unspecified: Secondary | ICD-10-CM

## 2022-05-02 DIAGNOSIS — E7800 Pure hypercholesterolemia, unspecified: Secondary | ICD-10-CM

## 2022-05-02 NOTE — Progress Notes (Signed)
Subjective:  [name], male    DOB: March 16, 1961, 61 y.o.   MRN: [id number]  Patient Care Team: Baruch Gouty, FNP as PCP - [facility], [facility] Francella Solian, MD as Referring Physician (Cardiology)   Chief Complaint:  Medical Management of Chronic Issues   HPI: Cameron Ware is a 61 y.o. male presenting on 05/02/2022 for Medical Management of Chronic Issues   1. Gastroesophageal reflux disease without esophagitis Is currently on Pepcid with minimal control of symptoms. Denies hematochezia, melena, hemoptysis, voice change, cough, or dysphagia. Was scheduled to see GI but has not followed up. Has continuous periumbilical abdominal pain that does not seem to be improving. Does have some bloating and nausea with the pain. No vomiting, diarrhea, or constipation. He has a known history of alcohol abuse and continues to drink. States he has but back but does drink some alcohol daily. States the pain can be sharp and severe at times.   2. Mild episode of recurrent major depressive disorder ([place]) 3. Generalized anxiety disorder Has been taking medications as prescribed. Dealing with a lot as his brother was just diagnosed with cancer.      05/02/2022   10:45 AM 01/31/2022    3:02 PM 07/29/2021    3:31 PM 06/08/2021    1:21 PM 06/01/2021   10:09 AM  Depression screen PHQ 2/9  Decreased Interest 0 0 0 1 3  Down, Depressed, Hopeless 0 1 0 1 3  PHQ - 2 Score 0 1 0 2 6  Altered sleeping 0 0 0 1 0  Tired, decreased energy 0 1 1 0 3  Change in appetite 0 0 0 0 3  Feeling bad or failure about yourself  0 0 1 0 3  Trouble concentrating 0 0 0 0 3  Moving slowly or fidgety/restless 0 0 0 0 0  Suicidal thoughts 0 0 0 0 0  PHQ-9 Score 0 _0 Difficult doing work/chores   Not difficult at all Somewhat difficult Not difficult at all      05/02/2022   10:46 AM 01/31/2022    3:02 PM 07/29/2021    3:31 PM 06/01/2021   10:09 AM  GAD 7  : Generalized Anxiety Score  Nervous, Anxious, on Edge 0 _1 Control/stop worrying 0 _2 Worry too much - different things 0 _3 Trouble relaxing 0 0 0 3  Restless 0 0 0 3  Easily annoyed or irritable 0 0 1 3  Afraid - awful might happen 0 0 1 3  Total GAD 7 Score 0 _4 Anxiety Difficulty  Not difficult at all Not difficult at all Not difficult at all    4. Atherosclerosis of native artery of both lower extremities with intermittent claudication ([place]) Followed by specialist on a regular basis. On statin, ASA, and plavix. No new or worsening, symptoms.   5. Mixed hyperlipidemia On statin therapy and tolerating well. Denies myalgias.    Relevant past medical, surgical, family, and social history reviewed and updated as indicated.  Allergies and medications reviewed and updated. Data reviewed: Chart in Epic.   Past Medical History:  Diagnosis Date   Anorexia 11/29/2010   Arthritis    Decreased vision    Depression    Dyspnea on exertion    Generalized headaches    High cholesterol 01/30/2017   Hypertension    Peripheral  vascular disease ([place])    Stroke [facility])     Past Surgical History:  Procedure Laterality Date   ABDOMINAL AORTAGRAM N/A 08/22/2011   Procedure: ABDOMINAL AORTAGRAM;  Surgeon: Serafina Mitchell, MD;  Location: [facility] CATH LAB;  Service: Cardiovascular;  Laterality: N/A;   aortogram     COLONOSCOPY N/A 06/07/2017   Procedure: COLONOSCOPY;  Surgeon: Rogene Houston, MD;  Location: [facility] ENDO SUITE;  Service: Endoscopy;  Laterality: N/A;  150   PERCUTANEOUS STENT INTERVENTION Bilateral 08/22/2011   Procedure: PERCUTANEOUS STENT INTERVENTION;  Surgeon: Serafina Mitchell, MD;  Location: [facility] CATH LAB;  Service: Cardiovascular;  Laterality: Bilateral;   Stents  August 22, 2011   Bilateral iliac PTA and stenting    Social History   Socioeconomic History   Marital status: Single    Spouse name: Not on file   Number of children: Not on file   Years of  education: Not on file   Highest education level: Not on file  Occupational History   Not on file  Tobacco Use   Smoking status: Former    Packs/day: 1.00    Years: 40.00    Total pack years: 40.00    Types: Cigarettes    Quit date: 08/04/2012    Years since quitting: 9.7   Smokeless tobacco: Never   Tobacco comments:    quit smoking 5 yrs ago. smoked one cigarette yesterday (09/25/2018)  Vaping Use   Vaping Use: Former  Substance and Sexual Activity   Alcohol use: Yes    Comment: wine every other day; 2-3 a day on 02/20/2019   Drug use: Yes    Types: Marijuana   Sexual activity: Not on file  Other Topics Concern   Not on file  Social History Narrative   Lives with mother, Berenice Primas, who is currently under Hospice care-06/08/2021.   Brother and sister in law live nearby and helps with pt's mom.   Social Determinants of Health   Financial Resource Strain: Low Risk  (06/08/2021)   Overall Financial Resource Strain (CARDIA)    Difficulty of Paying Living Expenses: Not hard at all  Food Insecurity: No Food Insecurity (06/08/2021)   Hunger Vital Sign    Worried About Running Out of Food in the Last Year: Never true    Ran Out of Food in the Last Year: Never true  Transportation Needs: No Transportation Needs (06/08/2021)   PRAPARE - Hydrologist (Medical): No    Lack of Transportation (Non-Medical): No  Physical Activity: Insufficiently Active (06/08/2021)   Exercise Vital Sign    Days of Exercise per Week: 4 days    Minutes of Exercise per Session: 30 min  Stress: Stress Concern Present (06/08/2021)   [place]    Feeling of Stress : To some extent  Social Connections: Moderately Integrated (06/08/2021)   Social Connection and Isolation Panel [NHANES]    Frequency of Communication with Friends and Family: More than three times a week    Frequency of Social Gatherings with Friends and  Family: More than three times a week    Attends Religious Services: 1 to 4 times per year    Active Member of [employer] or Organizations: Yes    Attends Archivist Meetings: 1 to 4 times per year    Marital Status: Never married  Intimate Partner Violence: Not At Risk (06/08/2021)   Humiliation, Afraid, Rape, and Kick questionnaire  Fear of Current or Ex-Partner: No    Emotionally Abused: No    Physically Abused: No    Sexually Abused: No    Outpatient Encounter Medications as of 05/02/2022  Medication Sig   aspirin EC 325 MG tablet Take 325 mg by mouth daily.   atorvastatin (LIPITOR) 80 MG tablet TAKE 1 TABLET DAILY AT 6 PM   clopidogrel (PLAVIX) 75 MG tablet TAKE 1 TABLET DAILY   desvenlafaxine (PRISTIQ) 25 MG 24 hr tablet TAKE 1 TABLET DAILY   dicyclomine (BENTYL) 10 MG capsule TAKE 1 CAPSULE 3 TIMES A DAY BEFORE MEALS   famotidine (PEPCID) 20 MG tablet TAKE 1 TABLET 2 TIMES A DAY   FLUoxetine (PROZAC) 40 MG capsule Take 40 mg by mouth daily.   meloxicam (MOBIC) 7.5 MG tablet TAKE 1 TABLET DAILY   pantoprazole (PROTONIX) 40 MG tablet TAKE 1 TABLET DAILY   potassium chloride (KLOR-CON) 10 MEQ tablet TAKE 2 TABLETS 2 TIMES A DAY   [DISCONTINUED] pravastatin (PRAVACHOL) 40 MG tablet Take 40 mg by mouth daily.     No facility-administered encounter medications on file as of 05/02/2022.    No Known Allergies  Review of Systems  Constitutional:  Positive for appetite change. Negative for activity change, chills, diaphoresis, fatigue, fever and unexpected weight change.  HENT: Negative.    Eyes: Negative.  Negative for photophobia and visual disturbance.  Respiratory:  Negative for cough, chest tightness and shortness of breath.   Cardiovascular:  Negative for chest pain, palpitations and leg swelling.  Gastrointestinal:  Positive for abdominal distention and abdominal pain. Negative for anal bleeding, blood in stool, constipation, diarrhea, nausea and vomiting.  Endocrine:  Negative.  Negative for polydipsia, polyphagia and polyuria.  Genitourinary:  Negative for decreased urine volume, difficulty urinating, dysuria, frequency and urgency.  Musculoskeletal:  Negative for arthralgias and myalgias.  Skin: Negative.   Allergic/Immunologic: Negative.   Neurological:  Negative for dizziness, tremors, seizures, syncope, facial asymmetry, speech difficulty, weakness, light-headedness, numbness and headaches.  Hematological: Negative.   Psychiatric/Behavioral:  Negative for confusion, hallucinations, sleep disturbance and suicidal ideas.   All other systems reviewed and are negative.       Objective:  BP 135/73   Pulse 66   Temp 98.5 F (36.9 C)   Ht 5' (1.524 m)   Wt 150 lb (68 kg)   SpO2 97%   BMI 29.29 kg/m    Wt Readings from Last 3 Encounters:  05/02/22 150 lb (68 kg)  01/31/22 153 lb (69.4 kg)  10/12/21 153 lb (69.4 kg)    Physical Exam Vitals and nursing note reviewed.  Constitutional:      General: He is not in acute distress.    Appearance: Normal appearance. He is well-developed, well-groomed and overweight. He is not ill-appearing, toxic-appearing or diaphoretic.  HENT:     Head: Normocephalic and atraumatic.     Jaw: There is normal jaw occlusion.     Right Ear: Hearing normal.     Left Ear: Hearing normal.     Nose: Nose normal.     Mouth/Throat:     Lips: Pink.     Mouth: Mucous membranes are moist.     Pharynx: Oropharynx is clear. Uvula midline.  Eyes:     General: Lids are normal.     Extraocular Movements: Extraocular movements intact.     Conjunctiva/sclera: Conjunctivae normal.     Pupils: Pupils are equal, round, and reactive to light.  Neck:     Thyroid:  No thyroid mass, thyromegaly or thyroid tenderness.     Vascular: No carotid bruit or JVD.     Trachea: Trachea and phonation normal.  Cardiovascular:     Rate and Rhythm: Normal rate and regular rhythm.     Chest Wall: PMI is not displaced.     Heart sounds: Normal  heart sounds. No murmur heard.    No friction rub. No gallop.  Pulmonary:     Effort: Pulmonary effort is normal. No respiratory distress.     Breath sounds: Normal breath sounds. No wheezing.  Abdominal:     General: Bowel sounds are normal. There is distension. There is no abdominal bruit.     Palpations: Abdomen is soft. There is no hepatomegaly or splenomegaly.     Tenderness: There is abdominal tenderness in the periumbilical area. There is no right CVA tenderness or left CVA tenderness.     Hernia: No hernia is present.    Musculoskeletal:        General: Normal range of motion.     Cervical back: Normal range of motion and neck supple.     Right lower leg: No edema.     Left lower leg: No edema.  Lymphadenopathy:     Cervical: No cervical adenopathy.  Skin:    General: Skin is warm and dry.     Capillary Refill: Capillary refill takes less than 2 seconds.     Coloration: Skin is not cyanotic, jaundiced or pale.     Findings: No rash.  Neurological:     General: No focal deficit present.     Mental Status: He is alert and oriented to person, place, and time.     Sensory: Sensation is intact.     Motor: Motor function is intact.     Coordination: Coordination is intact.     Gait: Gait is intact.     Deep Tendon Reflexes: Reflexes are normal and symmetric.  Psychiatric:        Attention and Perception: Attention and perception normal.        Mood and Affect: Mood and affect normal.        Speech: Speech normal.        Behavior: Behavior normal. Behavior is cooperative.        Thought Content: Thought content normal.        Cognition and Memory: Cognition and memory normal.        Judgment: Judgment normal.     Results for orders placed or performed in visit on 01/31/22  CBC with Differential/Platelet  Result Value Ref Range   WBC 7.8 3.4 - 10.8 x10E3/uL   RBC 4.94 4.14 - 5.80 x10E6/uL   Hemoglobin 15.7 13.0 - 17.7 g/dL   Hematocrit 44.9 37.5 - 51.0 %   MCV 91 79  - 97 fL   MCH 31.8 26.6 - 33.0 pg   MCHC 35.0 31.5 - 35.7 g/dL   RDW 12.7 11.6 - 15.4 %   Platelets 315 150 - 450 x10E3/uL   Neutrophils 65 Not Estab. %   Lymphs 25 Not Estab. %   Monocytes 8 Not Estab. %   Eos 1 Not Estab. %   Basos 1 Not Estab. %   Neutrophils Absolute 5.0 1.4 - 7.0 x10E3/uL   Lymphocytes Absolute 1.9 0.7 - 3.1 x10E3/uL   Monocytes Absolute 0.7 0.1 - 0.9 x10E3/uL   EOS (ABSOLUTE) 0.1 0.0 - 0.4 x10E3/uL   Basophils Absolute 0.1 0.0 - 0.2 x10E3/uL  Immature Granulocytes 0 Not Estab. %   Immature Grans (Abs) 0.0 0.0 - 0.1 x10E3/uL  CMP14+EGFR  Result Value Ref Range   Glucose 84 70 - 99 mg/dL   BUN 12 8 - 27 mg/dL   Creatinine, Ser 0.91 0.76 - 1.27 mg/dL   eGFR 95 >59 mL/min/1.73   BUN/Creatinine Ratio 13 10 - 24   Sodium 141 134 - 144 mmol/L   Potassium 4.4 3.5 - 5.2 mmol/L   Chloride 101 96 - 106 mmol/L   CO2 23 20 - 29 mmol/L   Calcium 9.7 8.6 - 10.2 mg/dL   Total Protein 7.1 6.0 - 8.5 g/dL   Albumin 4.6 3.9 - 4.9 g/dL   Globulin, Total 2.5 1.5 - 4.5 g/dL   Albumin/Globulin Ratio 1.8 1.2 - 2.2   Bilirubin Total 0.5 0.0 - 1.2 mg/dL   Alkaline Phosphatase 128 (H) 44 - 121 IU/L   AST 19 0 - 40 IU/L   ALT 17 0 - 44 IU/L  Lipid panel  Result Value Ref Range   Cholesterol, Total 166 100 - 199 mg/dL   Triglycerides 80 0 - 149 mg/dL   HDL 71 >39 mg/dL   VLDL Cholesterol Cal 15 5 - 40 mg/dL   LDL Chol Calc (NIH) 80 0 - 99 mg/dL   Chol/HDL Ratio 2.3 0.0 - 5.0 ratio       Pertinent labs & imaging results that were available during my care of the patient were reviewed by me and considered in my medical decision making.  Assessment & Plan:  Peng was seen today for medical management of chronic issues.  Diagnoses and all orders for this visit:  Gastroesophageal reflux disease without esophagitis Pt aware to follow up with GI as referred.  -     CBC with Differential/Platelet -     CT ABDOMEN PELVIS W CONTRAST; Future  Mild episode of recurrent major  depressive disorder (HCC) Generalized anxiety disorder Doing well on current regimen, will continue.  -     Thyroid Panel With TSH  Atherosclerosis of native artery of both lower extremities with intermittent claudication ([place]) Followed by specialist on a regular basis. Labs pending.  -     CMP14+EGFR -     CBC with Differential/Platelet  Mixed hyperlipidemia Diet encouraged - increase intake of fresh fruits and vegetables, increase intake of lean proteins. Bake, broil, or grill foods. Avoid fried, greasy, and fatty foods. Avoid fast foods. Increase intake of fiber-rich whole grains. Exercise encouraged - at least 150 minutes per week and advance as tolerated. Goal BMI < 25. Continue medications as prescribed. Follow up in 3-6 months as discussed.  -     Lipid panel  Periumbilical abdominal pain Ongoing. No recent imaging. Due to history and continued pain, will obtain CT abdomen. Aware to follow up with GI as referred. Labs pending. Aware of red flags which require emergent evaluation and treatment.  -     CMP14+EGFR -     CBC with Differential/Platelet -     CT ABDOMEN PELVIS W CONTRAST; Future  Alcohol abuse Cessation encouraged.  -     CMP14+EGFR -     CBC with Differential/Platelet -     CT ABDOMEN PELVIS W CONTRAST; Future     Continue all other maintenance medications.  Follow up plan: Return in about 6 months (around 11/01/2022), or if symptoms worsen or fail to improve, for CPE with labs.   Continue healthy lifestyle choices, including diet (rich in fruits,  vegetables, and lean proteins, and low in salt and simple carbohydrates) and exercise (at least 30 minutes of moderate physical activity daily).  Educational handout given for abdominal pain  The above assessment and management plan was discussed with the patient. The patient verbalized understanding of and has agreed to the management plan. Patient is aware to call the clinic if they develop any new symptoms or if  symptoms persist or worsen. Patient is aware when to return to the clinic for a follow-up visit. Patient educated on when it is appropriate to go to the emergency department.   Monia Pouch, FNP-C [facility] [phone]

## 2022-05-03 LAB — CMP14+EGFR
ALT: 26 IU/L (ref 0–44)
AST: 24 IU/L (ref 0–40)
Albumin/Globulin Ratio: 1.9 (ref 1.2–2.2)
Albumin: 4.7 g/dL (ref 3.9–4.9)
Alkaline Phosphatase: 119 IU/L (ref 44–121)
BUN/Creatinine Ratio: 13 (ref 10–24)
BUN: 14 mg/dL (ref 8–27)
Bilirubin Total: 0.7 mg/dL (ref 0.0–1.2)
CO2: 25 mmol/L (ref 20–29)
Calcium: 10.3 mg/dL — ABNORMAL HIGH (ref 8.6–10.2)
Chloride: 102 mmol/L (ref 96–106)
Creatinine, Ser: 1.05 mg/dL (ref 0.76–1.27)
Globulin, Total: 2.5 g/dL (ref 1.5–4.5)
Glucose: 100 mg/dL — ABNORMAL HIGH (ref 70–99)
Potassium: 4.7 mmol/L (ref 3.5–5.2)
Sodium: 140 mmol/L (ref 134–144)
Total Protein: 7.2 g/dL (ref 6.0–8.5)
eGFR: 80 mL/min/{1.73_m2} (ref >59)

## 2022-05-03 LAB — LIPID PANEL
Chol/HDL Ratio: 2.8 ratio (ref 0.0–5.0)
Cholesterol, Total: 191 mg/dL (ref 100–199)
HDL: 68 mg/dL (ref >39)
LDL Chol Calc (NIH): 92 mg/dL (ref 0–99)
Triglycerides: 185 mg/dL — ABNORMAL HIGH (ref 0–149)
VLDL Cholesterol Cal: 31 mg/dL (ref 5–40)

## 2022-05-03 LAB — CBC WITH DIFFERENTIAL/PLATELET
Basophils Absolute: 0.1 10*3/uL (ref 0.0–0.2)
Basos: 1 %
EOS (ABSOLUTE): 0.2 10*3/uL (ref 0.0–0.4)
Eos: 2 %
Hematocrit: 48.4 % (ref 37.5–51.0)
Hemoglobin: 16.6 g/dL (ref 13.0–17.7)
Immature Grans (Abs): 0 10*3/uL (ref 0.0–0.1)
Immature Granulocytes: 0 %
Lymphocytes Absolute: 1.9 10*3/uL (ref 0.7–3.1)
Lymphs: 23 %
MCH: 31 pg (ref 26.6–33.0)
MCHC: 34.3 g/dL (ref 31.5–35.7)
MCV: 91 fL (ref 79–97)
Monocytes Absolute: 0.9 10*3/uL (ref 0.1–0.9)
Monocytes: 11 %
Neutrophils Absolute: 5.2 10*3/uL (ref 1.4–7.0)
Neutrophils: 63 %
Platelets: 303 10*3/uL (ref 150–450)
RBC: 5.35 x10E6/uL (ref 4.14–5.80)
RDW: 11.9 % (ref 11.6–15.4)
WBC: 8.2 10*3/uL (ref 3.4–10.8)

## 2022-05-03 LAB — THYROID PANEL WITH TSH
Free Thyroxine Index: 1.6 (ref 1.2–4.9)
T3 Uptake Ratio: 27 % (ref 24–39)
T4, Total: 5.9 ug/dL (ref 4.5–12.0)
TSH: 3.08 u[IU]/mL (ref 0.450–4.500)

## 2022-05-03 NOTE — Addendum Note (Signed)
Addended by: [name] on: 05/03/2022 10:13 AM   Modules accepted: Orders

## 2022-05-12 ENCOUNTER — Other Ambulatory Visit: Payer: Self-pay | Admitting: Family Medicine

## 2022-05-12 DIAGNOSIS — R1030 Lower abdominal pain, unspecified: Secondary | ICD-10-CM

## 2022-05-16 ENCOUNTER — Ambulatory Visit (HOSPITAL_COMMUNITY): Admission: RE | Admit: 2022-05-16 | Payer: Medicare Other | Source: Ambulatory Visit

## 2022-05-30 ENCOUNTER — Other Ambulatory Visit: Payer: Self-pay | Admitting: Family Medicine

## 2022-05-30 DIAGNOSIS — R1030 Lower abdominal pain, unspecified: Secondary | ICD-10-CM

## 2022-06-12 ENCOUNTER — Ambulatory Visit (INDEPENDENT_AMBULATORY_CARE_PROVIDER_SITE_OTHER): Payer: 59

## 2022-06-12 VITALS — Ht 65.0 in | Wt 150.0 lb

## 2022-06-12 DIAGNOSIS — Z0000 Encounter for general adult medical examination without abnormal findings: Secondary | ICD-10-CM

## 2022-06-12 DIAGNOSIS — Z0100 Encounter for examination of eyes and vision without abnormal findings: Secondary | ICD-10-CM

## 2022-06-12 NOTE — Progress Notes (Signed)
Subjective:   Cameron Ware is a 62 y.o. male who presents for Medicare Annual/Subsequent preventive examination. I connected with  Cameron Ware on 06/12/22 by a audio enabled telemedicine application and verified that I am speaking with the correct person using two identifiers.  Patient Location: Home  Provider Location: Home Office  I discussed the limitations of evaluation and management by telemedicine. The patient expressed understanding and agreed to proceed.  Review of Systems     Cardiac Risk Factors include: advanced age (>35men, >87 women);male gender;hypertension;dyslipidemia     Objective:    Today's Vitals   06/12/22 1313  Weight: 150 lb (68 kg)  Height: 5\' 5"  (1.651 m)   Body mass index is 24.96 kg/m.     06/12/2022    1:16 PM 06/08/2021    1:25 PM 08/25/2019    9:11 AM 09/25/2018    9:50 PM 06/07/2017   11:26 AM 11/13/2016   12:28 PM 08/04/2016   10:35 AM  Advanced Directives  Does Patient Have a Medical Advance Directive? No No No No No No No  Would patient like information on creating a medical advance directive? No - Patient declined No - Patient declined No - Patient declined No - Patient declined No - Patient declined      Current Medications (verified) Outpatient Encounter Medications as of 06/12/2022  Medication Sig   aspirin EC 325 MG tablet Take 325 mg by mouth daily.   atorvastatin (LIPITOR) 80 MG tablet TAKE 1 TABLET DAILY AT 6 PM   clopidogrel (PLAVIX) 75 MG tablet TAKE 1 TABLET DAILY   desvenlafaxine (PRISTIQ) 25 MG 24 hr tablet TAKE 1 TABLET DAILY   dicyclomine (BENTYL) 10 MG capsule TAKE 1 CAPSULE 3 TIMES A DAY BEFORE MEALS   famotidine (PEPCID) 20 MG tablet TAKE ONE TABLET TWICE DAILY   FLUoxetine (PROZAC) 40 MG capsule Take 40 mg by mouth daily.   meloxicam (MOBIC) 7.5 MG tablet TAKE ONE TABLET DAILY   pantoprazole (PROTONIX) 40 MG tablet TAKE 1 TABLET DAILY   potassium chloride (KLOR-CON) 10 MEQ tablet TAKE 2 TABLETS 2  TIMES A DAY   [DISCONTINUED] pravastatin (PRAVACHOL) 40 MG tablet Take 40 mg by mouth daily.     No facility-administered encounter medications on file as of 06/12/2022.    Allergies (verified) Patient has no known allergies.   History: Past Medical History:  Diagnosis Date   Anorexia 11/29/2010   Arthritis    Decreased vision    Depression    Dyspnea on exertion    Generalized headaches    High cholesterol 01/30/2017   Hypertension    Peripheral vascular disease ([place])    Stroke [facility])    Past Surgical History:  Procedure Laterality Date   ABDOMINAL AORTAGRAM N/A 08/22/2011   Procedure: ABDOMINAL Maxcine Ham;  Surgeon: Serafina Mitchell, MD;  Location: [facility] CATH LAB;  Service: Cardiovascular;  Laterality: N/A;   aortogram     COLONOSCOPY N/A 06/07/2017   Procedure: COLONOSCOPY;  Surgeon: Rogene Houston, MD;  Location: [facility] SUITE;  Service: Endoscopy;  Laterality: N/A;  150   PERCUTANEOUS STENT INTERVENTION Bilateral 08/22/2011   Procedure: PERCUTANEOUS STENT INTERVENTION;  Surgeon: Serafina Mitchell, MD;  Location: [facility] CATH LAB;  Service: Cardiovascular;  Laterality: Bilateral;   Stents  August 22, 2011   Bilateral iliac PTA and stenting   Family History  Problem Relation Age of Onset   Heart disease Mother    Diabetes Father    Colon  cancer Father    Social History   Socioeconomic History   Marital status: Single    Spouse name: Not on file   Number of children: Not on file   Years of education: Not on file   Highest education level: Not on file  Occupational History   Not on file  Tobacco Use   Smoking status: Former    Packs/day: 1.00    Years: 40.00    Total pack years: 40.00    Types: Cigarettes    Quit date: 08/04/2012    Years since quitting: 9.8   Smokeless tobacco: Never   Tobacco comments:    quit smoking 5 yrs ago. smoked one cigarette yesterday (09/25/2018)  Vaping Use   Vaping Use: Former  Substance and Sexual Activity   Alcohol use: Yes    Comment:  wine every other day; 2-3 a day on 02/20/2019   Drug use: Yes    Types: Marijuana   Sexual activity: Not on file  Other Topics Concern   Not on file  Social History Narrative   Lives with mother, Cameron Ware, who is currently under Hospice care-06/08/2021.   Brother and sister in law live nearby and helps with pt's mom.   Social Determinants of Health   Financial Resource Strain: Low Risk  (06/12/2022)   Overall Financial Resource Strain (CARDIA)    Difficulty of Paying Living Expenses: Not hard at all  Food Insecurity: No Food Insecurity (06/12/2022)   Hunger Vital Sign    Worried About Running Out of Food in the Last Year: Never true    Ran Out of Food in the Last Year: Never true  Transportation Needs: No Transportation Needs (06/12/2022)   PRAPARE - Administrator, Civil Service (Medical): No    Lack of Transportation (Non-Medical): No  Physical Activity: Inactive (06/12/2022)   Exercise Vital Sign    Days of Exercise per Week: 0 days    Minutes of Exercise per Session: 0 min  Stress: No Stress Concern Present (06/12/2022)   [employer] of Occupational Health - Occupational Stress Questionnaire    Feeling of Stress : Only a little  Social Connections: Moderately Isolated (06/12/2022)   Social Connection and Isolation Panel [NHANES]    Frequency of Communication with Friends and Family: More than three times a week    Frequency of Social Gatherings with Friends and Family: More than three times a week    Attends Religious Services: 1 to 4 times per year    Active Member of [employer] or Organizations: No    Attends Banker Meetings: Never    Marital Status: Never married    Tobacco Counseling Counseling given: Not Answered Tobacco comments: quit smoking 5 yrs ago. smoked one cigarette yesterday (09/25/2018)   Clinical Intake:  Pre-visit preparation completed: Yes  Pain : No/denies pain     Nutritional Risks: None Diabetes: No  How often do you  need to have someone help you when you read instructions, pamphlets, or other written materials from your doctor or pharmacy?: 1 - Never  Diabetic?no   Interpreter Needed?: No  Information entered by :: Renie Ora, LPN   Activities of Daily Living    06/12/2022    1:16 PM  In your present state of health, do you have any difficulty performing the following activities:  Hearing? 0  Vision? 0  Difficulty concentrating or making decisions? 0  Walking or climbing stairs? 0  Dressing or bathing? 0  Doing errands,  shopping? 0  Preparing Food and eating ? N  Using the Toilet? N  In the past six months, have you accidently leaked urine? N  Do you have problems with loss of bowel control? N  Managing your Medications? N  Managing your Finances? N  Housekeeping or managing your Housekeeping? N    Patient Care Team: Baruch Gouty, FNP as PCP - [facility], [facility] Francella Solian, MD as Referring Physician (Cardiology)  Indicate any recent Medical Services you may have received from other than [facility] providers in the past year (date may be approximate).     Assessment:   This is a routine wellness examination for [place].  Hearing/Vision screen Vision Screening - Comments:: Referral 06/12/2022  Dietary issues and exercise activities discussed:     Goals Addressed             This Visit's Progress    Exercise 3x per week (30 min per time)   On track    Continue to exercise.       Depression Screen    06/12/2022    1:15 PM 05/02/2022   10:45 AM 01/31/2022    3:02 PM 07/29/2021    3:31 PM 06/08/2021    1:21 PM 06/01/2021   10:09 AM 04/15/2021   10:38 AM  PHQ 2/9 Scores  PHQ - 2 Score 0 0 1 0 2 6 5   PHQ- 9 Score 0 0 2 2 3 18 20     Fall Risk    06/12/2022    1:14 PM 01/31/2022    3:03 PM 06/08/2021    1:25 PM 06/01/2021   10:09 AM 04/15/2021   10:37 AM  Fall Risk   Falls in the past year? 0 0 0 0 0  Number falls in past yr: 0  0     Injury with Fall? 0  0    Risk for fall due to : No Fall Risks  No Fall Risks    Follow up Falls prevention discussed  Falls prevention discussed      FALL RISK PREVENTION PERTAINING TO THE HOME:  Any stairs in or around the home? Yes  If so, are there any without handrails? No  Home free of loose throw rugs in walkways, pet beds, electrical cords, etc? Yes  Adequate lighting in your home to reduce risk of falls? Yes   ASSISTIVE DEVICES UTILIZED TO PREVENT FALLS:  Life alert? No  Use of a cane, walker or w/c? No  Grab bars in the bathroom? Yes  Shower chair or bench in shower? No  Elevated toilet seat or a handicapped toilet? No        06/12/2022    1:17 PM 06/08/2021    1:27 PM  6CIT Screen  What Year? 0 points 0 points  What month? 0 points 0 points  What time? 0 points 0 points  Count back from 20 0 points 0 points  Months in reverse 0 points 4 points  Repeat phrase 0 points 2 points  Total Score 0 points 6 points    Immunizations Immunization History  Administered Date(s) Administered   Influenza, Quadrivalent, Recombinant, Inj, Pf 04/07/2015   Influenza,inj,Quad PF,6+ Mos 03/01/2016, 04/17/2017, 02/20/2019   Influenza,trivalent, recombinat, inj, PF 03/11/2014   Influenza-Unspecified 03/11/2014, 04/07/2015, 05/05/2020, 04/12/2021, 04/12/2021   Moderna Covid-19 Vaccine Bivalent Booster 61yrs & up 04/12/2021   Moderna Sars-Covid-2 Vaccination 09/08/2019, 10/06/2019, 04/27/2020   Tdap 03/25/2019   Zoster Recombinat (Shingrix) 07/29/2021, 01/31/2022  TDAP status: Up to date  Flu Vaccine status: Due, Education has been provided regarding the importance of this vaccine. Advised may receive this vaccine at local pharmacy or Health Dept. Aware to provide a copy of the vaccination record if obtained from local pharmacy or Health Dept. Verbalized acceptance and understanding.  Pneumococcal vaccine status: Due, Education has been provided regarding the importance of  this vaccine. Advised may receive this vaccine at local pharmacy or Health Dept. Aware to provide a copy of the vaccination record if obtained from local pharmacy or Health Dept. Verbalized acceptance and understanding.  Covid-19 vaccine status: Completed vaccines  Qualifies for Shingles Vaccine? Yes   Zostavax completed Yes   Shingrix Completed?: Yes  Screening Tests Health Maintenance  Topic Date Due   Lung Cancer Screening  12/05/2018   COVID-19 Vaccine (5 - 2023-24 season) 01/27/2022   INFLUENZA VACCINE  08/27/2022 (Originally 12/27/2021)   HIV Screening  02/01/2023 (Originally 03/18/1976)   Medicare Annual Wellness (AWV)  06/13/2023   COLONOSCOPY (Pts 45-72yrs Insurance coverage will need to be confirmed)  06/08/2027   DTaP/Tdap/Td (2 - Td or Tdap) 03/24/2029   Hepatitis C Screening  Completed   Zoster Vaccines- Shingrix  Completed   HPV VACCINES  Aged Out    Health Maintenance  Health Maintenance Due  Topic Date Due   Lung Cancer Screening  12/05/2018   COVID-19 Vaccine (5 - 2023-24 season) 01/27/2022    Colorectal cancer screening: Type of screening: Colonoscopy. Completed 06/07/2017. Repeat every 10 years  Lung Cancer Screening: (Low Dose CT Chest recommended if Age 22-80 years, 30 pack-year currently smoking OR have quit w/in 15years.) does qualify.   Lung Cancer Screening Referral: declined   Additional Screening:  Hepatitis C Screening: does not qualify; Completed 08/11/2016  Vision Screening: Recommended annual ophthalmology exams for early detection of glaucoma and other disorders of the eye. Is the patient up to date with their annual eye exam?  No  Who is the provider or what is the name of the office in which the patient attends annual eye exams? None , referral 06/12/2022 If pt is not established with a provider, would they like to be referred to a provider to establish care? No .   Dental Screening: Recommended annual dental exams for proper oral  hygiene  Community Resource Referral / Chronic Care Management: CRR required this visit?  No   CCM required this visit?  No      Plan:     I have personally reviewed and noted the following in the patient's chart:   Medical and social history Use of alcohol, tobacco or illicit drugs  Current medications and supplements including opioid prescriptions. Patient is not currently taking opioid prescriptions. Functional ability and status Nutritional status Physical activity Advanced directives List of other physicians Hospitalizations, surgeries, and ER visits in previous 12 months Vitals Screenings to include cognitive, depression, and falls Referrals and appointments  In addition, I have reviewed and discussed with patient certain preventive protocols, quality metrics, and best practice recommendations. A written personalized care plan for preventive services as well as general preventive health recommendations were provided to patient.     Lorrene Reid, LPN   [id number]/[id number]   Nurse Notes: Due Pneumonia vaccine , declined lung cancer screening

## 2022-06-12 NOTE — Patient Instructions (Signed)
Cameron Ware , Thank you for taking time to come for your Medicare Wellness Visit. I appreciate your ongoing commitment to your health goals. Please review the following plan we discussed and let me know if I can assist you in the future.   These are the goals we discussed:  Goals      Exercise 3x per week (30 min per time)     Continue to exercise.        This is a list of the screening recommended for you and due dates:  Health Maintenance  Topic Date Due   Screening for Lung Cancer  12/05/2018   COVID-19 Vaccine (5 - 2023-24 season) 01/27/2022   Flu Shot  08/27/2022*   HIV Screening  02/01/2023*   Medicare Annual Wellness Visit  06/13/2023   Colon Cancer Screening  06/08/2027   DTaP/Tdap/Td vaccine (2 - Td or Tdap) 03/24/2029   Hepatitis C Screening: USPSTF Recommendation to screen - Ages 2-79 yo.  Completed   Zoster (Shingles) Vaccine  Completed   HPV Vaccine  Aged Out  *Topic was postponed. The date shown is not the original due date.    Advanced directives: Advance directive discussed with you today. I have provided a copy for you to complete at home and have notarized. Once this is complete please bring a copy in to our office so we can scan it into your chart.   Conditions/risks identified: Aim for 30 minutes of exercise or brisk walking, 6-8 glasses of water, and 5 servings of fruits and vegetables each day.   Next appointment: Follow up in one year for your annual wellness visit.   Preventive Care 26 Years and Older, Male  Preventive care refers to lifestyle choices and visits with your health care provider that can promote health and wellness. What does preventive care include? A yearly physical exam. This is also called an annual well check. Dental exams once or twice a year. Routine eye exams. Ask your health care provider how often you should have your eyes checked. Personal lifestyle choices, including: Daily care of your teeth and gums. Regular physical  activity. Eating a healthy diet. Avoiding tobacco and drug use. Limiting alcohol use. Practicing safe sex. Taking low doses of aspirin every day. Taking vitamin and mineral supplements as recommended by your health care provider. What happens during an annual well check? The services and screenings done by your health care provider during your annual well check will depend on your age, overall health, lifestyle risk factors, and family history of disease. Counseling  Your health care provider may ask you questions about your: Alcohol use. Tobacco use. Drug use. Emotional well-being. Home and relationship well-being. Sexual activity. Eating habits. History of falls. Memory and ability to understand (cognition). Work and work Statistician. Screening  You may have the following tests or measurements: Height, weight, and BMI. Blood pressure. Lipid and cholesterol levels. These may be checked every 5 years, or more frequently if you are over 90 years old. Skin check. Lung cancer screening. You may have this screening every year starting at age 18 if you have a 30-pack-year history of smoking and currently smoke or have quit within the past 15 years. Fecal occult blood test (FOBT) of the stool. You may have this test every year starting at age 43. Flexible sigmoidoscopy or colonoscopy. You may have a sigmoidoscopy every 5 years or a colonoscopy every 10 years starting at age 59. Prostate cancer screening. Recommendations will vary depending on your family  history and other risks. Hepatitis C blood test. Hepatitis B blood test. Sexually transmitted disease (STD) testing. Diabetes screening. This is done by checking your blood sugar (glucose) after you have not eaten for a while (fasting). You may have this done every 1-3 years. Abdominal aortic aneurysm (AAA) screening. You may need this if you are a current or former smoker. Osteoporosis. You may be screened starting at age 7 if you are  at high risk. Talk with your health care provider about your test results, treatment options, and if necessary, the need for more tests. Vaccines  Your health care provider may recommend certain vaccines, such as: Influenza vaccine. This is recommended every year. Tetanus, diphtheria, and acellular pertussis (Tdap, Td) vaccine. You may need a Td booster every 10 years. Zoster vaccine. You may need this after age 53. Pneumococcal 13-valent conjugate (PCV13) vaccine. One dose is recommended after age 31. Pneumococcal polysaccharide (PPSV23) vaccine. One dose is recommended after age 37. Talk to your health care provider about which screenings and vaccines you need and how often you need them. This information is not intended to replace advice given to you by your health care provider. Make sure you discuss any questions you have with your health care provider. Document Released: 06/11/2015 Document Revised: 02/02/2016 Document Reviewed: 03/16/2015 Elsevier Interactive Patient Education  2017 [employer] Prevention in the Home Falls can cause injuries. They can happen to people of all ages. There are many things you can do to make your home safe and to help prevent falls. What can I do on the outside of my home? Regularly fix the edges of walkways and driveways and fix any cracks. Remove anything that might make you trip as you walk through a door, such as a raised step or threshold. Trim any bushes or trees on the path to your home. Use bright outdoor lighting. Clear any walking paths of anything that might make someone trip, such as rocks or tools. Regularly check to see if handrails are loose or broken. Make sure that both sides of any steps have handrails. Any raised decks and porches should have guardrails on the edges. Have any leaves, snow, or ice cleared regularly. Use sand or salt on walking paths during winter. Clean up any spills in your garage right away. This includes oil  or grease spills. What can I do in the bathroom? Use night lights. Install grab bars by the toilet and in the tub and shower. Do not use towel bars as grab bars. Use non-skid mats or decals in the tub or shower. If you need to sit down in the shower, use a plastic, non-slip stool. Keep the floor dry. Clean up any water that spills on the floor as soon as it happens. Remove soap buildup in the tub or shower regularly. Attach bath mats securely with double-sided non-slip rug tape. Do not have throw rugs and other things on the floor that can make you trip. What can I do in the bedroom? Use night lights. Make sure that you have a light by your bed that is easy to reach. Do not use any sheets or blankets that are too big for your bed. They should not hang down onto the floor. Have a firm chair that has side arms. You can use this for support while you get dressed. Do not have throw rugs and other things on the floor that can make you trip. What can I do in the kitchen? Clean up any  spills right away. Avoid walking on wet floors. Keep items that you use a lot in easy-to-reach places. If you need to reach something above you, use a strong step stool that has a grab bar. Keep electrical cords out of the way. Do not use floor polish or wax that makes floors slippery. If you must use wax, use non-skid floor wax. Do not have throw rugs and other things on the floor that can make you trip. What can I do with my stairs? Do not leave any items on the stairs. Make sure that there are handrails on both sides of the stairs and use them. Fix handrails that are broken or loose. Make sure that handrails are as long as the stairways. Check any carpeting to make sure that it is firmly attached to the stairs. Fix any carpet that is loose or worn. Avoid having throw rugs at the top or bottom of the stairs. If you do have throw rugs, attach them to the floor with carpet tape. Make sure that you have a light  switch at the top of the stairs and the bottom of the stairs. If you do not have them, ask someone to add them for you. What else can I do to help prevent falls? Wear shoes that: Do not have high heels. Have rubber bottoms. Are comfortable and fit you well. Are closed at the toe. Do not wear sandals. If you use a stepladder: Make sure that it is fully opened. Do not climb a closed stepladder. Make sure that both sides of the stepladder are locked into place. Ask someone to hold it for you, if possible. Clearly mark and make sure that you can see: Any grab bars or handrails. First and last steps. Where the edge of each step is. Use tools that help you move around (mobility aids) if they are needed. These include: Canes. Walkers. Scooters. Crutches. Turn on the lights when you go into a dark area. Replace any light bulbs as soon as they burn out. Set up your furniture so you have a clear path. Avoid moving your furniture around. If any of your floors are uneven, fix them. If there are any pets around you, be aware of where they are. Review your medicines with your doctor. Some medicines can make you feel dizzy. This can increase your chance of falling. Ask your doctor what other things that you can do to help prevent falls. This information is not intended to replace advice given to you by your health care provider. Make sure you discuss any questions you have with your health care provider. Document Released: 03/11/2009 Document Revised: 10/21/2015 Document Reviewed: 06/19/2014 Elsevier Interactive Patient Education  2017 [employer].

## 2022-06-23 ENCOUNTER — Other Ambulatory Visit: Payer: Self-pay | Admitting: Family Medicine

## 2022-06-23 DIAGNOSIS — F411 Generalized anxiety disorder: Secondary | ICD-10-CM

## 2022-06-23 DIAGNOSIS — F331 Major depressive disorder, recurrent, moderate: Secondary | ICD-10-CM

## 2022-06-26 ENCOUNTER — Ambulatory Visit (HOSPITAL_COMMUNITY)
Admission: RE | Admit: 2022-06-26 | Discharge: 2022-06-26 | Disposition: A | Discharge status: Home / Self Care | Payer: 59 | Source: Ambulatory Visit | Attending: Family Medicine | Admitting: Family Medicine

## 2022-06-26 DIAGNOSIS — F1010 Alcohol abuse, uncomplicated: Secondary | ICD-10-CM | POA: Diagnosis present

## 2022-06-26 DIAGNOSIS — I70213 Atherosclerosis of native arteries of extremities with intermittent claudication, bilateral legs: Secondary | ICD-10-CM | POA: Diagnosis not present

## 2022-06-26 DIAGNOSIS — R1033 Periumbilical pain: Secondary | ICD-10-CM | POA: Insufficient documentation

## 2022-06-26 DIAGNOSIS — K219 Gastro-esophageal reflux disease without esophagitis: Secondary | ICD-10-CM | POA: Insufficient documentation

## 2022-06-26 MED ORDER — IOHEXOL 300 MG/ML  SOLN
100.0000 mL | Freq: Once | INTRAMUSCULAR | Status: AC | PRN
  Administered 2022-06-26: 100 mL via INTRAVENOUS

## 2022-06-27 DIAGNOSIS — R109 Unspecified abdominal pain: Secondary | ICD-10-CM | POA: Diagnosis not present

## 2022-06-27 DIAGNOSIS — I700 Atherosclerosis of aorta: Secondary | ICD-10-CM | POA: Diagnosis not present

## 2022-07-17 ENCOUNTER — Other Ambulatory Visit: Payer: Self-pay | Admitting: Family Medicine

## 2022-07-17 DIAGNOSIS — K219 Gastro-esophageal reflux disease without esophagitis: Secondary | ICD-10-CM

## 2022-08-29 ENCOUNTER — Other Ambulatory Visit: Payer: Self-pay | Admitting: Family Medicine

## 2022-08-29 DIAGNOSIS — R1030 Lower abdominal pain, unspecified: Secondary | ICD-10-CM

## 2022-09-15 ENCOUNTER — Other Ambulatory Visit: Payer: Self-pay | Admitting: Family Medicine

## 2022-09-15 DIAGNOSIS — F411 Generalized anxiety disorder: Secondary | ICD-10-CM

## 2022-09-15 DIAGNOSIS — F331 Major depressive disorder, recurrent, moderate: Secondary | ICD-10-CM

## 2022-10-12 ENCOUNTER — Other Ambulatory Visit: Payer: Self-pay | Admitting: *Deleted

## 2022-10-12 DIAGNOSIS — I739 Peripheral vascular disease, unspecified: Secondary | ICD-10-CM

## 2022-10-17 ENCOUNTER — Other Ambulatory Visit: Payer: Self-pay | Admitting: Family Medicine

## 2022-10-17 DIAGNOSIS — K219 Gastro-esophageal reflux disease without esophagitis: Secondary | ICD-10-CM

## 2022-10-24 NOTE — Progress Notes (Deleted)
HISTORY AND PHYSICAL     CC:  follow up. Requesting Provider:  [name], FNP  HPI: This is a 63 y.o. male who is here today for follow up for PAD.  Pt has hx of stenting of right EIA, left EIA and CIA for claudication on 12/13/2010 by Dr. Myra Gianotti.  He subsequently underwent stent of bilateral CIA stenting on 08/22/2011 also by Dr. Myra Gianotti.    Pt was last seen 10/12/2021 and at that time, he was able to walk at least 200 ft on flat ground.  [facility] may be less but he did not have lifestyle limiting claudication.  He as returned to smoking a few cigarettes per day.  He had been evaluated for chronic mesenteric ischemia in the past but had gained some weight.  He had been diagnosed with some other stomach issues. He was to continue his dual antiplatelet therapy and statin and f/u in one year.  Dr. Randie Heinz discussed that if his claudication worsened, we could consider interventioin but only if he was smoke free.   The pt returns today for follow up.  ***  The pt is on a statin for cholesterol management.    The pt is on an aspirin.    Other AC:  Plavix The pt is not on medication for hypertension.  The pt is not on medication for diabetes. Tobacco hx:  ***  Pt does *** have family hx of AAA.  Past Medical History:  Diagnosis Date   Anorexia 11/29/2010   Arthritis    Decreased vision    Depression    Dyspnea on exertion    Generalized headaches    High cholesterol 01/30/2017   Hypertension    Peripheral vascular disease (HCC)    Stroke [facility])     Past Surgical History:  Procedure Laterality Date   ABDOMINAL AORTAGRAM N/A 08/22/2011   Procedure: ABDOMINAL Ronny Flurry;  Surgeon: Nada Libman, MD;  Location: [facility] CATH LAB;  Service: Cardiovascular;  Laterality: N/A;   aortogram     COLONOSCOPY N/A 06/07/2017   Procedure: COLONOSCOPY;  Surgeon: Malissa Hippo, MD;  Location: [facility] ENDO SUITE;  Service: Endoscopy;  Laterality: N/A;  150   PERCUTANEOUS STENT INTERVENTION Bilateral 08/22/2011    Procedure: PERCUTANEOUS STENT INTERVENTION;  Surgeon: Nada Libman, MD;  Location: [facility] CATH LAB;  Service: Cardiovascular;  Laterality: Bilateral;   Stents  August 22, 2011   Bilateral iliac PTA and stenting    No Known Allergies  Current Outpatient Medications  Medication Sig Dispense Refill   aspirin EC 325 MG tablet Take 325 mg by mouth daily.     atorvastatin (LIPITOR) 80 MG tablet TAKE 1 TABLET DAILY AT 6 PM 90 tablet 1   clopidogrel (PLAVIX) 75 MG tablet TAKE 1 TABLET DAILY 90 tablet 1   desvenlafaxine (PRISTIQ) 25 MG 24 hr tablet TAKE ONE TABLET DAILY 90 tablet 0   dicyclomine (BENTYL) 10 MG capsule TAKE 1 CAPSULE 3 TIMES A DAY BEFORE MEALS 270 capsule 1   famotidine (PEPCID) 20 MG tablet TAKE ONE TABLET TWICE DAILY 60 tablet 5   FLUoxetine (PROZAC) 40 MG capsule Take 40 mg by mouth daily.     meloxicam (MOBIC) 7.5 MG tablet TAKE ONE TABLET DAILY 90 tablet 0   pantoprazole (PROTONIX) 40 MG tablet TAKE ONE TABLET DAILY 90 tablet 0   potassium chloride (KLOR-CON) 10 MEQ tablet TAKE 2 TABLETS 2 TIMES A DAY 360 tablet 1   No current facility-administered medications for this  visit.    Family History  Problem Relation Age of Onset   Heart disease Mother    Diabetes Father    Colon cancer Father     Social History   Socioeconomic History   Marital status: Single    Spouse name: Not on file   Number of children: Not on file   Years of education: Not on file   Highest education level: Not on file  Occupational History   Not on file  Tobacco Use   Smoking status: Former    Packs/day: 1.00    Years: 40.00    Additional pack years: 0.00    Total pack years: 40.00    Types: Cigarettes    Quit date: 08/04/2012    Years since quitting: 10.2   Smokeless tobacco: Never   Tobacco comments:    quit smoking 5 yrs ago. smoked one cigarette yesterday (09/25/2018)  Vaping Use   Vaping Use: Former  Substance and Sexual Activity   Alcohol use: Yes    Comment: wine every other  day; 2-3 a day on 02/20/2019   Drug use: Yes    Types: Marijuana   Sexual activity: Not on file  Other Topics Concern   Not on file  Social History Narrative   Lives with mother, Doristine Counter, who is currently under Hospice care-06/08/2021.   Brother and sister in law live nearby and helps with pt's mom.   Social Determinants of Health   Financial Resource Strain: Low Risk  (06/12/2022)   Overall Financial Resource Strain (CARDIA)    Difficulty of Paying Living Expenses: Not hard at all  Food Insecurity: No Food Insecurity (06/12/2022)   Hunger Vital Sign    Worried About Running Out of Food in the Last Year: Never true    Ran Out of Food in the Last Year: Never true  Transportation Needs: No Transportation Needs (06/12/2022)   PRAPARE - Administrator, Civil Service (Medical): No    Lack of Transportation (Non-Medical): No  Physical Activity: Inactive (06/12/2022)   Exercise Vital Sign    Days of Exercise per Week: 0 days    Minutes of Exercise per Session: 0 min  Stress: No Stress Concern Present (06/12/2022)   [employer] of Occupational Health - Occupational Stress Questionnaire    Feeling of Stress : Only a little  Social Connections: Moderately Isolated (06/12/2022)   Social Connection and Isolation Panel [NHANES]    Frequency of Communication with Friends and Family: More than three times a week    Frequency of Social Gatherings with Friends and Family: More than three times a week    Attends Religious Services: 1 to 4 times per year    Active Member of [employer] or Organizations: No    Attends Banker Meetings: Never    Marital Status: Never married  Intimate Partner Violence: Not At Risk (06/12/2022)   Humiliation, Afraid, Rape, and Kick questionnaire    Fear of Current or Ex-Partner: No    Emotionally Abused: No    Physically Abused: No    Sexually Abused: No     REVIEW OF SYSTEMS:  *** [X]  denotes positive finding, [ ]  denotes negative  finding Cardiac  Comments:  Chest pain or chest pressure:    Shortness of breath upon exertion:    Short of breath when lying flat:    Irregular heart rhythm:        Vascular    Pain in calf, thigh, or hip brought  on by ambulation:    Pain in feet at night that wakes you up from your sleep:     Blood clot in your veins:    Leg swelling:         Pulmonary    Oxygen at home:    Productive cough:     Wheezing:         Neurologic    Sudden weakness in arms or legs:     Sudden numbness in arms or legs:     Sudden onset of difficulty speaking or slurred speech:    Temporary loss of vision in one eye:     Problems with dizziness:         Gastrointestinal    Blood in stool:     Vomited blood:         Genitourinary    Burning when urinating:     Blood in urine:        Psychiatric    Major depression:         Hematologic    Bleeding problems:    Problems with blood clotting too easily:        Skin    Rashes or ulcers:        Constitutional    Fever or chills:      PHYSICAL EXAMINATION:  ***  General:  WDWN in NAD; vital signs documented above Gait: Not observed HENT: WNL, normocephalic Pulmonary: normal non-labored breathing , without wheezing Cardiac: {Desc; regular/irreg:14544} HR, {With/Without:20273} carotid bruit*** Abdomen: soft, NT; aortic pulse is *** palpable Skin: {With/Without:20273} rashes Vascular Exam/Pulses:  Right Left  Radial {Exam; arterial pulse strength 0-4:30167} {Exam; arterial pulse strength 0-4:30167}  Femoral {Exam; arterial pulse strength 0-4:30167} {Exam; arterial pulse strength 0-4:30167}  Popliteal {Exam; arterial pulse strength 0-4:30167} {Exam; arterial pulse strength 0-4:30167}  DP {Exam; arterial pulse strength 0-4:30167} {Exam; arterial pulse strength 0-4:30167}  PT {Exam; arterial pulse strength 0-4:30167} {Exam; arterial pulse strength 0-4:30167}  Peroneal *** ***   Extremities: {With/Without:20273} ischemic changes,  {With/Without:20273} Gangrene , {With/Without:20273} cellulitis; {With/Without:20273} open wounds Musculoskeletal: no muscle wasting or atrophy  Neurologic: A&O X 3 Psychiatric:  The pt has {Desc; normal/abnormal:11317::"Normal"} affect.   Non-Invasive Vascular Imaging:   ABI's/TBI's on 10/25/2022: Right:  *** - Great toe pressure: *** Left:  *** - Great toe pressure: ***  Arterial duplex on 10/25/2022: *** Previous ABI's/TBI's on 10/12/2021: Right:  0.82/0.51 - Great toe pressure: 80 Left:  0.80/0.50 - Great toe pressure:  78  Previous arterial duplex on 10/12/2021: Abdominal Aorta Findings:  +-------------+-------+----------+----------+----------+--------+--------+  Location    AP (cm)Trans (cm)PSV (cm/s)Waveform  ThrombusComments  +-------------+-------+----------+----------+----------+--------+--------+  Distal                       36        biphasic                    +-------------+-------+----------+----------+----------+--------+--------+  RT CIA Prox                   100       monophasic                  +-------------+-------+----------+----------+----------+--------+--------+  RT CIA Mid                    73        monophasic                  +-------------+-------+----------+----------+----------+--------+--------+  RT CIA Distal                 125       monophasic                  +-------------+-------+----------+----------+----------+--------+--------+  RT EIA Prox                                               NV        +-------------+-------+----------+----------+----------+--------+--------+  RT EIA Mid                    51                                    +-------------+-------+----------+----------+----------+--------+--------+  RT EIA Distal                 388                                   +-------------+-------+----------+----------+----------+--------+--------+  LT CIA Prox                    48        monophasic                  +-------------+-------+----------+----------+----------+--------+--------+  LT CIA Mid                    60        monophasic                  +-------------+-------+----------+----------+----------+--------+--------+  LT CIA Distal                 46        monophasic                  +-------------+-------+----------+----------+----------+--------+--------+  LT EIA Prox                   94        monophasic                  +-------------+-------+----------+----------+----------+--------+--------+  LT EIA Mid                    165       monophasic                  +-------------+-------+----------+----------+----------+--------+--------+  LT EIA Distal                 388       monophasic                  +-------------+-------+----------+----------+----------+--------+--------+     ASSESSMENT/PLAN:: 62 y.o. male here for follow up for PAD with hx of  stenting of right EIA, left EIA and CIA for claudication on 12/13/2010 by Dr. Myra Gianotti.  He subsequently underwent stent of bilateral CIA stenting on 08/22/2011 also by Dr. Myra Gianotti.     -*** -continue asa/statin/plavix -pt will f/u in *** with ***.   Doreatha Massed, [facility] [phone]  Clinic MD:   Randie Heinz

## 2022-10-25 ENCOUNTER — Ambulatory Visit: Payer: 59

## 2022-10-25 ENCOUNTER — Encounter (HOSPITAL_COMMUNITY): Payer: 59

## 2022-10-25 ENCOUNTER — Inpatient Hospital Stay (HOSPITAL_COMMUNITY): Admission: RE | Admit: 2022-10-25 | Payer: 59 | Source: Ambulatory Visit

## 2022-10-27 ENCOUNTER — Other Ambulatory Visit: Payer: Self-pay | Admitting: Family Medicine

## 2022-10-27 DIAGNOSIS — I739 Peripheral vascular disease, unspecified: Secondary | ICD-10-CM

## 2022-10-27 DIAGNOSIS — E7800 Pure hypercholesterolemia, unspecified: Secondary | ICD-10-CM

## 2022-10-27 DIAGNOSIS — R1030 Lower abdominal pain, unspecified: Secondary | ICD-10-CM

## 2022-11-01 ENCOUNTER — Ambulatory Visit (INDEPENDENT_AMBULATORY_CARE_PROVIDER_SITE_OTHER): Payer: 59 | Admitting: Family Medicine

## 2022-11-01 ENCOUNTER — Encounter: Payer: Self-pay | Admitting: Family Medicine

## 2022-11-01 VITALS — BP 133/75 | HR 74 | Temp 97.2°F | Ht 65.0 in | Wt 147.2 lb

## 2022-11-01 DIAGNOSIS — I70219 Atherosclerosis of native arteries of extremities with intermittent claudication, unspecified extremity: Secondary | ICD-10-CM | POA: Diagnosis not present

## 2022-11-01 DIAGNOSIS — Z0001 Encounter for general adult medical examination with abnormal findings: Secondary | ICD-10-CM

## 2022-11-01 DIAGNOSIS — G8929 Other chronic pain: Secondary | ICD-10-CM

## 2022-11-01 DIAGNOSIS — Z1159 Encounter for screening for other viral diseases: Secondary | ICD-10-CM

## 2022-11-01 DIAGNOSIS — Z2821 Immunization not carried out because of patient refusal: Secondary | ICD-10-CM | POA: Diagnosis not present

## 2022-11-01 DIAGNOSIS — R1030 Lower abdominal pain, unspecified: Secondary | ICD-10-CM

## 2022-11-01 DIAGNOSIS — R6889 Other general symptoms and signs: Secondary | ICD-10-CM | POA: Diagnosis not present

## 2022-11-01 DIAGNOSIS — M542 Cervicalgia: Secondary | ICD-10-CM

## 2022-11-01 DIAGNOSIS — Z5320 Procedure and treatment not carried out because of patient's decision for unspecified reasons: Secondary | ICD-10-CM

## 2022-11-01 DIAGNOSIS — K219 Gastro-esophageal reflux disease without esophagitis: Secondary | ICD-10-CM | POA: Diagnosis not present

## 2022-11-01 DIAGNOSIS — Z114 Encounter for screening for human immunodeficiency virus [HIV]: Secondary | ICD-10-CM

## 2022-11-01 DIAGNOSIS — Z0000 Encounter for general adult medical examination without abnormal findings: Secondary | ICD-10-CM

## 2022-11-01 DIAGNOSIS — Z7289 Other problems related to lifestyle: Secondary | ICD-10-CM | POA: Diagnosis not present

## 2022-11-01 DIAGNOSIS — Z1329 Encounter for screening for other suspected endocrine disorder: Secondary | ICD-10-CM

## 2022-11-01 DIAGNOSIS — E785 Hyperlipidemia, unspecified: Secondary | ICD-10-CM | POA: Diagnosis not present

## 2022-11-01 MED ORDER — PREDNISONE 20 MG PO TABS
40.0000 mg | ORAL_TABLET | Freq: Every day | ORAL | 0 refills | Status: AC
Start: 2022-11-01 — End: 2022-11-06

## 2022-11-01 NOTE — Progress Notes (Signed)
Subjective:  [name], male    DOB: Nov 23, 1960, 62 y.o.   MRN: [id number]  Patient Care Team: [name], FNP as PCP - [facility] ([facility], [facility] Jodie Echevaria Osie Cheeks, MD as Referring Physician (Cardiology)   Chief Complaint:  Annual Exam   HPI: Cameron Ware is a 62 y.o. male presenting on 11/01/2022 for Annual Exam   Pt presents today for annual physical exam. He states overall he is doing well. He declines indicated cancer screenings today. Declines vaccinations today. Does not follow with a dentist or eye doctor on a regular basis. States he has cut back on his drinking significantly since he lost his brother. He does report ongoing abdominal pain and distention. Has been taking bentyl with some relief of symptoms. Has not seen GI. Does see vascular surgery. Denies changes in bowel habits, melena, or hematochezia. No urinary symptoms reported. Denies fever, chills, weakness, or confusion. CT abdomen in 05/2022 was negative for acute findings. He also has ongoing neck pain that has worsened over the last several weeks. CT in the past revealed degenerative changes with foraminal narrowing. He has not followed up with orthopedics or neurosurgery. He is on Meloxicam but reports this only helps some.      Relevant past medical, surgical, family, and social history reviewed and updated as indicated.  Allergies and medications reviewed and updated. Data reviewed: Chart in Epic.   Past Medical History:  Diagnosis Date   Anorexia 11/29/2010   Arthritis    Decreased vision    Depression    Dyspnea on exertion    Generalized headaches    High cholesterol 01/30/2017   Hypertension    Peripheral vascular disease (HCC)    Stroke [facility])     Past Surgical History:  Procedure Laterality Date   ABDOMINAL AORTAGRAM N/A 08/22/2011   Procedure: ABDOMINAL Ronny Flurry;  Surgeon: Nada Libman, MD;  Location: [facility] CATH LAB;  Service:  Cardiovascular;  Laterality: N/A;   aortogram     COLONOSCOPY N/A 06/07/2017   Procedure: COLONOSCOPY;  Surgeon: Malissa Hippo, MD;  Location: [facility] ENDO SUITE;  Service: Endoscopy;  Laterality: N/A;  150   PERCUTANEOUS STENT INTERVENTION Bilateral 08/22/2011   Procedure: PERCUTANEOUS STENT INTERVENTION;  Surgeon: Nada Libman, MD;  Location: [facility] CATH LAB;  Service: Cardiovascular;  Laterality: Bilateral;   Stents  August 22, 2011   Bilateral iliac PTA and stenting    Social History   Socioeconomic History   Marital status: Single    Spouse name: Not on file   Number of children: Not on file   Years of education: Not on file   Highest education level: Not on file  Occupational History   Not on file  Tobacco Use   Smoking status: Former    Packs/day: 1.00    Years: 40.00    Additional pack years: 0.00    Total pack years: 40.00    Types: Cigarettes    Quit date: 08/04/2012    Years since quitting: 10.2   Smokeless tobacco: Never   Tobacco comments:    quit smoking 5 yrs ago. smoked one cigarette yesterday (09/25/2018)  Vaping Use   Vaping Use: Former  Substance and Sexual Activity   Alcohol use: Yes    Comment: wine every other day; 2-3 a day on 02/20/2019   Drug use: Yes    Types: Marijuana   Sexual activity: Not on file  Other Topics Concern  Not on file  Social History Narrative   Lives with mother, Cameron Ware, who is currently under Hospice care-06/08/2021.   Brother and sister in law live nearby and helps with pt's mom.   Social Determinants of Health   Financial Resource Strain: Low Risk  (06/12/2022)   Overall Financial Resource Strain (CARDIA)    Difficulty of Paying Living Expenses: Not hard at all  Food Insecurity: No Food Insecurity (06/12/2022)   Hunger Vital Sign    Worried About Running Out of Food in the Last Year: Never true    Ran Out of Food in the Last Year: Never true  Transportation Needs: No Transportation Needs (06/12/2022)   PRAPARE - Therapist, art (Medical): No    Lack of Transportation (Non-Medical): No  Physical Activity: Inactive (06/12/2022)   Exercise Vital Sign    Days of Exercise per Week: 0 days    Minutes of Exercise per Session: 0 min  Stress: No Stress Concern Present (06/12/2022)   [employer] of Occupational Health - Occupational Stress Questionnaire    Feeling of Stress : Only a little  Social Connections: Moderately Isolated (06/12/2022)   Social Connection and Isolation Panel [NHANES]    Frequency of Communication with Friends and Family: More than three times a week    Frequency of Social Gatherings with Friends and Family: More than three times a week    Attends Religious Services: 1 to 4 times per year    Active Member of [employer] or Organizations: No    Attends Banker Meetings: Never    Marital Status: Never married  Intimate Partner Violence: Not At Risk (06/12/2022)   Humiliation, Afraid, Rape, and Kick questionnaire    Fear of Current or Ex-Partner: No    Emotionally Abused: No    Physically Abused: No    Sexually Abused: No    Outpatient Encounter Medications as of 11/01/2022  Medication Sig   aspirin EC 325 MG tablet Take 325 mg by mouth daily.   atorvastatin (LIPITOR) 80 MG tablet TAKE 1 TABLET DAILY AT 6 PM   clopidogrel (PLAVIX) 75 MG tablet TAKE ONE TABLET DAILY   desvenlafaxine (PRISTIQ) 25 MG 24 hr tablet TAKE ONE TABLET DAILY   dicyclomine (BENTYL) 10 MG capsule TAKE 1 CAPSULE 3 TIMES A DAY BEFORE MEALS   famotidine (PEPCID) 20 MG tablet TAKE ONE TABLET TWICE DAILY   meloxicam (MOBIC) 7.5 MG tablet TAKE ONE TABLET DAILY   pantoprazole (PROTONIX) 40 MG tablet TAKE ONE TABLET DAILY   potassium chloride (KLOR-CON) 10 MEQ tablet TAKE TWO TABLETS TWICE DAILY   predniSONE (DELTASONE) 20 MG tablet Take 2 tablets (40 mg total) by mouth daily with breakfast for 5 days.   [DISCONTINUED] FLUoxetine (PROZAC) 40 MG capsule Take 40 mg by mouth daily. (Patient not  taking: Reported on 11/01/2022)   [DISCONTINUED] pravastatin (PRAVACHOL) 40 MG tablet Take 40 mg by mouth daily.     No facility-administered encounter medications on file as of 11/01/2022.    No Known Allergies  Review of Systems  Constitutional:  Negative for activity change, appetite change, chills, diaphoresis, fatigue, fever and unexpected weight change.  HENT: Negative.    Eyes: Negative.  Negative for photophobia and visual disturbance.  Respiratory:  Negative for cough, chest tightness and shortness of breath.   Cardiovascular:  Negative for chest pain, palpitations and leg swelling.  Gastrointestinal:  Positive for abdominal distention and abdominal pain. Negative for anal bleeding, blood in stool,  constipation, diarrhea, nausea, rectal pain and vomiting.  Endocrine: Negative.  Negative for cold intolerance, heat intolerance, polydipsia, polyphagia and polyuria.  Genitourinary:  Negative for decreased urine volume, difficulty urinating, dysuria, frequency and urgency.  Musculoskeletal:  Positive for arthralgias, myalgias, neck pain and neck stiffness. Negative for back pain, gait problem and joint swelling.  Skin: Negative.   Allergic/Immunologic: Negative.   Neurological:  Negative for dizziness, tremors, seizures, syncope, facial asymmetry, speech difficulty, weakness, light-headedness, numbness and headaches.  Hematological: Negative.   Psychiatric/Behavioral:  Negative for confusion, hallucinations, sleep disturbance and suicidal ideas.         Objective:  BP 133/75   Pulse 74   Temp (!) 97.2 F (36.2 C) (Temporal)   Ht 5\' 5"  (1.651 m)   Wt 147 lb 3.2 oz (66.8 kg)   SpO2 98%   BMI 24.50 kg/m    Wt Readings from Last 3 Encounters:  11/01/22 147 lb 3.2 oz (66.8 kg)  06/12/22 150 lb (68 kg)  05/02/22 150 lb (68 kg)    Physical Exam Vitals and nursing note reviewed.  Constitutional:      General: He is not in acute distress.    Appearance: Normal appearance. He is  well-developed, well-groomed and normal weight. He is not ill-appearing, toxic-appearing or diaphoretic.  HENT:     Head: Normocephalic and atraumatic.     Jaw: There is normal jaw occlusion.     Right Ear: Hearing, tympanic membrane, ear canal and external ear normal.     Left Ear: Hearing, tympanic membrane, ear canal and external ear normal.     Nose: Nose normal.     Mouth/Throat:     Lips: Pink.     Mouth: Mucous membranes are moist.     Pharynx: Oropharynx is clear. Uvula midline.  Eyes:     General: Lids are normal.     Extraocular Movements: Extraocular movements intact.     Conjunctiva/sclera: Conjunctivae normal.     Pupils: Pupils are equal, round, and reactive to light.  Neck:     Thyroid: No thyroid mass, thyromegaly or thyroid tenderness.     Vascular: No carotid bruit or JVD.     Trachea: Trachea and phonation normal.  Cardiovascular:     Rate and Rhythm: Normal rate and regular rhythm.     Chest Wall: PMI is not displaced.     Pulses: Normal pulses.     Heart sounds: Normal heart sounds. No murmur heard.    No friction rub. No gallop.  Pulmonary:     Effort: Pulmonary effort is normal. No respiratory distress.     Breath sounds: Normal breath sounds. No wheezing.  Abdominal:     General: Abdomen is protuberant. Bowel sounds are normal. There is no distension or abdominal bruit.     Palpations: Abdomen is soft. There is no hepatomegaly or splenomegaly.     Tenderness: There is abdominal tenderness in the periumbilical area and suprapubic area. There is guarding. There is no right CVA tenderness, left CVA tenderness or rebound. Negative signs include Murphy's sign, Rovsing's sign, McBurney's sign and obturator sign.     Hernia: No hernia is present.  Genitourinary:    Comments: Declined  Musculoskeletal:     Right shoulder: Normal.     Left shoulder: Normal.     Cervical back: Neck supple. Tenderness present. No swelling, edema, deformity, erythema, signs of  trauma, lacerations, rigidity, spasms, torticollis, bony tenderness or crepitus. Muscular tenderness present. No pain with movement  or spinous process tenderness. Decreased range of motion.     Thoracic back: Normal.     Right lower leg: No edema.     Left lower leg: No edema.  Lymphadenopathy:     Cervical: No cervical adenopathy.  Skin:    General: Skin is warm and dry.     Capillary Refill: Capillary refill takes less than 2 seconds.     Coloration: Skin is not cyanotic, jaundiced or pale.     Findings: No rash.  Neurological:     General: No focal deficit present.     Mental Status: He is alert and oriented to person, place, and time.     Sensory: Sensation is intact.     Motor: Motor function is intact.     Coordination: Coordination is intact.     Gait: Gait is intact.     Deep Tendon Reflexes: Reflexes are normal and symmetric.  Psychiatric:        Attention and Perception: Attention and perception normal.        Mood and Affect: Mood and affect normal.        Speech: Speech normal.        Behavior: Behavior normal. Behavior is cooperative.        Thought Content: Thought content normal.        Cognition and Memory: Cognition and memory normal.        Judgment: Judgment normal.     Results for orders placed or performed in visit on 05/02/22  CMP14+EGFR  Result Value Ref Range   Glucose 100 (H) 70 - 99 mg/dL   BUN 14 8 - 27 mg/dL   Creatinine, Ser 5.78 0.76 - 1.27 mg/dL   eGFR 80 >46 NG/EXB/2.84   BUN/Creatinine Ratio 13 10 - 24   Sodium 140 134 - 144 mmol/L   Potassium 4.7 3.5 - 5.2 mmol/L   Chloride 102 96 - 106 mmol/L   CO2 25 20 - 29 mmol/L   Calcium 10.3 (H) 8.6 - 10.2 mg/dL   Total Protein 7.2 6.0 - 8.5 g/dL   Albumin 4.7 3.9 - 4.9 g/dL   Globulin, Total 2.5 1.5 - 4.5 g/dL   Albumin/Globulin Ratio 1.9 1.2 - 2.2   Bilirubin Total 0.7 0.0 - 1.2 mg/dL   Alkaline Phosphatase 119 44 - 121 IU/L   AST 24 0 - 40 IU/L   ALT 26 0 - 44 IU/L  CBC with  Differential/Platelet  Result Value Ref Range   WBC 8.2 3.4 - 10.8 x10E3/uL   RBC 5.35 4.14 - 5.80 x10E6/uL   Hemoglobin 16.6 13.0 - 17.7 g/dL   Hematocrit 13.2 44.0 - 51.0 %   MCV 91 79 - 97 fL   MCH 31.0 26.6 - 33.0 pg   MCHC 34.3 31.5 - 35.7 g/dL   RDW 10.2 72.5 - 36.6 %   Platelets 303 150 - 450 x10E3/uL   Neutrophils 63 Not Estab. %   Lymphs 23 Not Estab. %   Monocytes 11 Not Estab. %   Eos 2 Not Estab. %   Basos 1 Not Estab. %   Neutrophils Absolute 5.2 1.4 - 7.0 x10E3/uL   Lymphocytes Absolute 1.9 0.7 - 3.1 x10E3/uL   Monocytes Absolute 0.9 0.1 - 0.9 x10E3/uL   EOS (ABSOLUTE) 0.2 0.0 - 0.4 x10E3/uL   Basophils Absolute 0.1 0.0 - 0.2 x10E3/uL   Immature Granulocytes 0 Not Estab. %   Immature Grans (Abs) 0.0 0.0 - 0.1 x10E3/uL  Lipid panel  Result Value Ref Range   Cholesterol, Total 191 100 - 199 mg/dL   Triglycerides 323 (H) 0 - 149 mg/dL   HDL 68 >55 mg/dL   VLDL Cholesterol Cal 31 5 - 40 mg/dL   LDL Chol Calc (NIH) 92 0 - 99 mg/dL   Chol/HDL Ratio 2.8 0.0 - 5.0 ratio  Thyroid Panel With TSH  Result Value Ref Range   TSH 3.080 0.450 - 4.500 uIU/mL   T4, Total 5.9 4.5 - 12.0 ug/dL   T3 Uptake Ratio 27 24 - 39 %   Free Thyroxine Index 1.6 1.2 - 4.9       Pertinent labs & imaging results that were available during my care of the patient were reviewed by me and considered in my medical decision making.  Assessment & Plan:  Cristo was seen today for annual exam.  Diagnoses and all orders for this visit:  Annual physical exam Health maintenance dicussed in detail. Declined cancer screenings at this time. Agrees to below.  -     CMP14+EGFR -     CBC with Differential/Platelet -     Lipid panel -     Thyroid Panel With TSH -     HIV Antibody (routine testing w rflx) -     Hepatitis C antibody  Screening for endocrine disorder -     CMP14+EGFR -     Thyroid Panel With TSH  Need for hepatitis C screening test -     Hepatitis C antibody  Encounter for  screening for HIV -     HIV Antibody (routine testing w rflx)  Lung cancer screening declined by patient Declined imaging.   Prostate cancer screening declined Declined screening.  Vaccination declined by patient All vaccinations declined.  Lower abdominal pain Diagnostic procedure declined  Declined imaging. Will check CBC today. Referral to GI. Aware to follow up with vascular surgery.  -     CBC with Differential/Platelet -     Ambulatory referral to Gastroenterology -     Cancel: CT Abdomen Pelvis W Contrast; Future  Neck pain, chronic Chronic in nature. Previous imaging reviewed and revealed degenerative changes. Will burst with steroids to see if beneficial. Pt aware to report continued symptoms. Will refer to neurosurgery if warranted.  -     predniSONE (DELTASONE) 20 MG tablet; Take 2 tablets (40 mg total) by mouth daily with breakfast for 5 days.     Continue all other maintenance medications.  Follow up plan: Return in about 3 months (around 02/01/2023), or if symptoms worsen or fail to improve, for chronic follow up.   Continue healthy lifestyle choices, including diet (rich in fruits, vegetables, and lean proteins, and low in salt and simple carbohydrates) and exercise (at least 30 minutes of moderate physical activity daily).  Educational handout given for health maintenance   The above assessment and management plan was discussed with the patient. The patient verbalized understanding of and has agreed to the management plan. Patient is aware to call the clinic if they develop any new symptoms or if symptoms persist or worsen. Patient is aware when to return to the clinic for a follow-up visit. Patient educated on when it is appropriate to go to the emergency department.   Kari Baars, FNP-C [facility] [phone]

## 2022-11-02 ENCOUNTER — Encounter (INDEPENDENT_AMBULATORY_CARE_PROVIDER_SITE_OTHER): Payer: Self-pay | Admitting: *Deleted

## 2022-11-02 LAB — CBC WITH DIFFERENTIAL/PLATELET
Basophils Absolute: 0.1 10*3/uL (ref 0.0–0.2)
Basos: 1 %
EOS (ABSOLUTE): 0.2 10*3/uL (ref 0.0–0.4)
Eos: 2 %
Hematocrit: 45.1 % (ref 37.5–51.0)
Hemoglobin: 15.4 g/dL (ref 13.0–17.7)
Immature Grans (Abs): 0 10*3/uL (ref 0.0–0.1)
Immature Granulocytes: 0 %
Lymphocytes Absolute: 1.7 10*3/uL (ref 0.7–3.1)
Lymphs: 17 %
MCH: 31 pg (ref 26.6–33.0)
MCHC: 34.1 g/dL (ref 31.5–35.7)
MCV: 91 fL (ref 79–97)
Monocytes Absolute: 1.5 10*3/uL — ABNORMAL HIGH (ref 0.1–0.9)
Monocytes: 15 %
Neutrophils Absolute: 6.5 10*3/uL (ref 1.4–7.0)
Neutrophils: 65 %
Platelets: 323 10*3/uL (ref 150–450)
RBC: 4.97 x10E6/uL (ref 4.14–5.80)
RDW: 11.8 % (ref 11.6–15.4)
WBC: 9.9 10*3/uL (ref 3.4–10.8)

## 2022-11-02 LAB — CMP14+EGFR
ALT: 17 IU/L (ref 0–44)
AST: 16 IU/L (ref 0–40)
Albumin/Globulin Ratio: 1.6 (ref 1.2–2.2)
Albumin: 4.3 g/dL (ref 3.9–4.9)
Alkaline Phosphatase: 117 IU/L (ref 44–121)
BUN/Creatinine Ratio: 13 (ref 10–24)
BUN: 12 mg/dL (ref 8–27)
Bilirubin Total: 0.7 mg/dL (ref 0.0–1.2)
CO2: 24 mmol/L (ref 20–29)
Calcium: 9.8 mg/dL (ref 8.6–10.2)
Chloride: 101 mmol/L (ref 96–106)
Creatinine, Ser: 0.92 mg/dL (ref 0.76–1.27)
Globulin, Total: 2.7 g/dL (ref 1.5–4.5)
Glucose: 96 mg/dL (ref 70–99)
Potassium: 4.1 mmol/L (ref 3.5–5.2)
Sodium: 138 mmol/L (ref 134–144)
Total Protein: 7 g/dL (ref 6.0–8.5)
eGFR: 93 mL/min/{1.73_m2} (ref >59)

## 2022-11-02 LAB — LIPID PANEL
Chol/HDL Ratio: 2.8 ratio (ref 0.0–5.0)
Cholesterol, Total: 128 mg/dL (ref 100–199)
HDL: 46 mg/dL (ref >39)
LDL Chol Calc (NIH): 63 mg/dL (ref 0–99)
Triglycerides: 105 mg/dL (ref 0–149)
VLDL Cholesterol Cal: 19 mg/dL (ref 5–40)

## 2022-11-02 LAB — THYROID PANEL WITH TSH
Free Thyroxine Index: 2.1 (ref 1.2–4.9)
T3 Uptake Ratio: 29 % (ref 24–39)
T4, Total: 7.2 ug/dL (ref 4.5–12.0)
TSH: 3.36 u[IU]/mL (ref 0.450–4.500)

## 2022-11-02 LAB — HEPATITIS C ANTIBODY: Hep C Virus Ab: NONREACTIVE

## 2022-11-02 LAB — HIV ANTIBODY (ROUTINE TESTING W REFLEX): HIV Screen 4th Generation wRfx: NONREACTIVE

## 2022-11-18 ENCOUNTER — Other Ambulatory Visit: Payer: Self-pay | Admitting: Family Medicine

## 2022-11-18 DIAGNOSIS — R1030 Lower abdominal pain, unspecified: Secondary | ICD-10-CM

## 2022-12-06 ENCOUNTER — Other Ambulatory Visit: Payer: Self-pay | Admitting: Family Medicine

## 2022-12-06 DIAGNOSIS — F411 Generalized anxiety disorder: Secondary | ICD-10-CM

## 2022-12-06 DIAGNOSIS — F331 Major depressive disorder, recurrent, moderate: Secondary | ICD-10-CM

## 2022-12-19 ENCOUNTER — Other Ambulatory Visit: Payer: Self-pay | Admitting: Family Medicine

## 2022-12-19 DIAGNOSIS — F411 Generalized anxiety disorder: Secondary | ICD-10-CM

## 2022-12-19 DIAGNOSIS — F331 Major depressive disorder, recurrent, moderate: Secondary | ICD-10-CM

## 2023-01-02 ENCOUNTER — Ambulatory Visit (INDEPENDENT_AMBULATORY_CARE_PROVIDER_SITE_OTHER): Payer: 59 | Admitting: Gastroenterology

## 2023-01-02 ENCOUNTER — Encounter (INDEPENDENT_AMBULATORY_CARE_PROVIDER_SITE_OTHER): Payer: Self-pay | Admitting: Gastroenterology

## 2023-01-02 ENCOUNTER — Telehealth (INDEPENDENT_AMBULATORY_CARE_PROVIDER_SITE_OTHER): Payer: Self-pay | Admitting: Gastroenterology

## 2023-01-02 VITALS — BP 174/90 | HR 65 | Temp 98.0°F | Ht 65.0 in | Wt 151.7 lb

## 2023-01-02 DIAGNOSIS — I771 Stricture of artery: Secondary | ICD-10-CM | POA: Diagnosis not present

## 2023-01-02 DIAGNOSIS — Z800 Family history of malignant neoplasm of digestive organs: Secondary | ICD-10-CM

## 2023-01-02 DIAGNOSIS — R1033 Periumbilical pain: Secondary | ICD-10-CM

## 2023-01-02 MED ORDER — PEG 3350-KCL-NA BICARB-NACL 420 G PO SOLR: 4000.0000 mL | Freq: Once | ORAL | 0 refills | Status: AC

## 2023-01-02 NOTE — H&P (View-Only) (Signed)
Referring Provider: [name], FNP Primary Care Physician:  [name], FNP Primary GI Physician: Previously Rehman (Dr. Tasia Catchings)   Chief Complaint  Patient presents with   Abdominal Pain    Referred for lower abdominal pain. States its been happening for years. States his stomach is thin and has had stents put in - in the past.    HPI:   Cameron Ware is a 62 y.o. male with past medical history of arthritis, depression, high cholesterol, HTN, PVD, stroke   Patient presenting today as a new patient for abdominal pain   CT A/P with contrast done 05/2022 with sigmoid diverticulosis without inflammation, no acute abnormality  Per chart review, appears patient has chronic history of lower abdominal pain. CTA in 2018 with 70% narrowing at origin of celiac axis and 70-80% narrowing just beyond origin of SMA. saw vascular in 2019 though pain not felt to be ischemic. He has history of bilateral common iliac artery stents in 2013. He follows with Dr. Randie Heinz, last seen in May 2023.   Present:  Patient reports ongoing periumbilical abdominal pain for the past few years though worse recently. Pain is present all the time. About anything he eats he feels will make his pain worse. Denies rectal bleeding or melena. Defecation does not improve his pain. Appetite is okay.  Weight has been relatively stable. He does note that any pushing/pulling or doing much exertion will cause his abdominal pain to be worse. He is having a BM usually daily. No nausea or vomiting. Taking dicyclomine 10mg  TID PRN without much improvement of pain.  He notes that he previously was drinking a 12 pack of beer daily, now drinking 2-3 24 oz beers almost everyday though sometimes will skip a few days.  He reports he is supposed to see Vein and vascular yearly but he missed his last appt so is not seeing them again until October.   NSAID use: none  Social hx: alcohol use as above, 1-2 Cigarettes per day   Fam hx:  father had colon cancer, patient thinks father was in his 60s at time of diagnosis?   Last Colonoscopy: 05/2017 diverticulosis, hemorrhoids   Recommendations:    Past Medical History:  Diagnosis Date   Anorexia 11/29/2010   Arthritis    Decreased vision    Depression    Dyspnea on exertion    Generalized headaches    High cholesterol 01/30/2017   Hypertension    Peripheral vascular disease (HCC)    Stroke [facility])     Past Surgical History:  Procedure Laterality Date   ABDOMINAL AORTAGRAM N/A 08/22/2011   Procedure: ABDOMINAL Ronny Flurry;  Surgeon: Nada Libman, MD;  Location: [facility] CATH LAB;  Service: Cardiovascular;  Laterality: N/A;   aortogram     COLONOSCOPY N/A 06/07/2017   Procedure: COLONOSCOPY;  Surgeon: Malissa Hippo, MD;  Location: [facility] ENDO [facility];  Service: Endoscopy;  Laterality: N/A;  150   PERCUTANEOUS STENT INTERVENTION Bilateral 08/22/2011   Procedure: PERCUTANEOUS STENT INTERVENTION;  Surgeon: Nada Libman, MD;  Location: [facility] CATH LAB;  Service: Cardiovascular;  Laterality: Bilateral;   Stents  August 22, 2011   Bilateral iliac PTA and stenting    Current Outpatient Medications  Medication Sig Dispense Refill   aspirin EC 325 MG tablet Take 325 mg by mouth daily.     atorvastatin (LIPITOR) 80 MG tablet TAKE 1 TABLET DAILY AT 6 PM 90 tablet 0   clopidogrel (PLAVIX) 75 MG tablet  TAKE ONE TABLET DAILY 90 tablet 0   desvenlafaxine (PRISTIQ) 25 MG 24 hr tablet TAKE ONE TABLET DAILY 90 tablet 0   dicyclomine (BENTYL) 10 MG capsule TAKE 1 CAPSULE 3 TIMES A DAY BEFORE MEALS 270 capsule 0   famotidine (PEPCID) 20 MG tablet TAKE ONE TABLET TWICE DAILY 60 tablet 2   pantoprazole (PROTONIX) 40 MG tablet TAKE ONE TABLET DAILY 90 tablet 0   potassium chloride (KLOR-CON) 10 MEQ tablet TAKE TWO TABLETS TWICE DAILY 360 tablet 0   meloxicam (MOBIC) 7.5 MG tablet TAKE ONE TABLET DAILY 90 tablet 0   No current facility-administered medications for this visit.    Allergies as  of 01/02/2023   (No Known Allergies)    Family History  Problem Relation Age of Onset   Heart disease Mother    Diabetes Father    Colon cancer Father     Social History   Socioeconomic History   Marital status: Single    Spouse name: Not on file   Number of children: Not on file   Years of education: Not on file   Highest education level: Not on file  Occupational History   Not on file  Tobacco Use   Smoking status: Former    Current packs/day: 0.00    Average packs/day: 1 pack/day for 40.0 years (40.0 ttl pk-yrs)    Types: Cigarettes    Start date: 08/04/1972    Quit date: 08/04/2012    Years since quitting: 10.4   Smokeless tobacco: Never   Tobacco comments:    quit smoking 5 yrs ago. smoked one cigarette yesterday (09/25/2018)  Vaping Use   Vaping status: Former  Substance and Sexual Activity   Alcohol use: Yes    Comment: wine every other day; 2-3 a day on 02/20/2019   Drug use: Yes    Types: Marijuana   Sexual activity: Not on file  Other Topics Concern   Not on file  Social History Narrative   Lives with mother, Cameron Ware, who is currently under Hospice care-06/08/2021.   Brother and sister in law live nearby and helps with pt's mom.   Social Determinants of Health   Financial Resource Strain: Low Risk  (06/12/2022)   Overall Financial Resource Strain (CARDIA)    Difficulty of Paying Living Expenses: Not hard at all  Food Insecurity: No Food Insecurity (06/12/2022)   Hunger Vital Sign    Worried About Running Out of Food in the Last Year: Never true    Ran Out of Food in the Last Year: Never true  Transportation Needs: No Transportation Needs (06/12/2022)   PRAPARE - Administrator, Civil Service (Medical): No    Lack of Transportation (Non-Medical): No  Physical Activity: Inactive (06/12/2022)   Exercise Vital Sign    Days of Exercise per Week: 0 days    Minutes of Exercise per Session: 0 min  Stress: No Stress Concern Present (06/12/2022)   [employer] of Occupational Health - Occupational Stress Questionnaire    Feeling of Stress : Only a little  Social Connections: Moderately Isolated (06/12/2022)   Social Connection and Isolation Panel [NHANES]    Frequency of Communication with Friends and Family: More than three times a week    Frequency of Social Gatherings with Friends and Family: More than three times a week    Attends Religious Services: 1 to 4 times per year    Active Member of [employer] or Organizations: No    Attends [employer]  or Organization Meetings: Never    Marital Status: Never married    Review of systems General: negative for malaise, night sweats, fever, chills, weight loss Neck: Negative for lumps, goiter, pain and significant neck swelling Resp: Negative for cough, wheezing, dyspnea at rest CV: Negative for chest pain, leg swelling, palpitations, orthopnea GI: denies melena, hematochezia, nausea, vomiting, diarrhea, constipation, dysphagia, odyonophagia, early satiety or unintentional weight loss. +periumbilical abdominal pain MSK: Negative for joint pain or swelling, back pain, and muscle pain. Derm: Negative for itching or rash Psych: Denies depression, anxiety, memory loss, confusion. No homicidal or suicidal ideation.  Heme: Negative for prolonged bleeding, bruising easily, and swollen nodes. Endocrine: Negative for cold or heat intolerance, polyuria, polydipsia and goiter. Neuro: negative for tremor, gait imbalance, syncope and seizures. The remainder of the review of systems is noncontributory.  Physical Exam: BP (!) 174/90   Pulse 65   Temp 98 F (36.7 C) (Oral)   Ht 5\' 5"  (1.651 m)   Wt 151 lb 11.2 oz (68.8 kg)   BMI 25.24 kg/m  General:   Alert and oriented. No distress noted. Pleasant and cooperative.  Head:  Normocephalic and atraumatic. Eyes:  Conjuctiva clear without scleral icterus. Mouth:  Oral mucosa pink and moist. Good dentition. No lesions. Heart: Normal rate and rhythm, s1 and s2  heart sounds present.  Lungs: Clear lung sounds in all lobes. Respirations equal and unlabored. Abdomen:  +BS, soft, and non-distended. TTP of periumbilical area. No rebound or guarding. No HSM or masses noted. Derm: No palmar erythema or jaundice Msk:  Symmetrical without gross deformities. Normal posture. Extremities:  Without edema. Neurologic:  Alert and  oriented x4 Psych:  Alert and cooperative. Normal mood and affect.  Invalid input(s): "6 MONTHS"   ASSESSMENT: DRAYCE HOLLINGS is a 62 y.o. male presenting today for periumbilical abdominal pain  Patient with chronic periumbilical abdominal pain that has been ongoing for the past few years, CTA in 2018 with  70% narrowing at origin of celiac axis and 70-80% narrowing just beyond origin of SMA though vascular did not feel his pain was related to ischemia. He notes worsening pain over the past few months and pain increased with eating. No rectal bleeding, melena or diarrhea. No weight loss. Give his history of significant vascular disease, presentation raises some concern for potential mesenteric ischemia. Recommend updating CT angio of the abdomen/pelvis for further evaluation. He should continue to follow with vascular surgery, has upcoming appt with them in October.   Last Colonoscopy in January 2019, he reports CRC history in his father, believes father was diagnosed in his 19s, this would put patient at increased risk for developing CRC, recommend updating screening colonoscopy at this time. Indications, risks and benefits of procedure discussed in detail with patient. Patient verbalized understanding and is in agreement to proceed with Colonoscopy.    PLAN:  CT Angio A/p with/wo  2. Schedule screening colonoscopy-ASA III (patient on chronic opiates, will need extended prep) 3. Continue to follow with vascular surgery   All questions were answered, patient verbalized understanding and is in agreement with plan as outlined above.    Follow Up: 3 months   Joaovictor Krone L. Jeanmarie Hubert, MSN, APRN, AGNP-C Adult-Gerontology Nurse Practitioner [facility]

## 2023-01-02 NOTE — Progress Notes (Unsigned)
Referring Provider: [name], FNP Primary Care Physician:  [name], FNP Primary GI Physician: Previously Rehman (Dr. Tasia Catchings)   Chief Complaint  Patient presents with   Abdominal Pain    Referred for lower abdominal pain. States its been happening for years. States his stomach is thin and has had stents put in - in the past.    HPI:   Cameron Ware is a 62 y.o. male with past medical history of arthritis, depression, high cholesterol, HTN, PVD, stroke   Patient presenting today as a new patient for abdominal pain   CT A/P with contrast done 05/2022 with sigmoid diverticulosis without inflammation, no acute abnormality  Per chart review, appears patient has chronic history of lower abdominal pain. CTA in 2018 with 70% narrowing at origin of celiac axis and 70-80% narrowing just beyond origin of SMA. saw vascular in 2019 though pain not felt to be ischemicHe has history of bilateral common iliac artery stents in 2013. He follows with Dr. Randie Heinz, last seen in May 2023.   Present:  Patient reports ongoing lower abdominal pain for the past few years though worse recently. Pain is present all the time. About anything he eats he feels may make his pain some worse. Denies rectal bleeding or melena. Defecation does not improve his pain. Appetite is pretty good. Weight has been relatively stable. He does note that any pushing/pulling or doing much exertion will cause his abdominal pain to be worse. He is having a BM usually daily. No nausea or vomiting. Taking dicyclomine 10mg  TID PRN without much improvement of pain.  He notes that he previously was drinking a 12 pack of beer previously, now drinking 2-3 24 oz beers almost daily.   He reports he is supposed to see Vein and vascular yearly but he missed his last appt so is not seeing them again until October.   NSAID use:  Social hx: alcohol use as above, 1-2 Cigarettes per day   Fam hx: father had colon cancer <25 years of age    Last Colonoscopy: 05/2017 diverticulosis, hemorrhoids   Recommendations:    Past Medical History:  Diagnosis Date   Anorexia 11/29/2010   Arthritis    Decreased vision    Depression    Dyspnea on exertion    Generalized headaches    High cholesterol 01/30/2017   Hypertension    Peripheral vascular disease (HCC)    Stroke [facility])     Past Surgical History:  Procedure Laterality Date   ABDOMINAL AORTAGRAM N/A 08/22/2011   Procedure: ABDOMINAL Ronny Flurry;  Surgeon: Nada Libman, MD;  Location: [facility] CATH LAB;  Service: Cardiovascular;  Laterality: N/A;   aortogram     COLONOSCOPY N/A 06/07/2017   Procedure: COLONOSCOPY;  Surgeon: Malissa Hippo, MD;  Location: [facility] ENDO [facility];  Service: Endoscopy;  Laterality: N/A;  150   PERCUTANEOUS STENT INTERVENTION Bilateral 08/22/2011   Procedure: PERCUTANEOUS STENT INTERVENTION;  Surgeon: Nada Libman, MD;  Location: [facility] CATH LAB;  Service: Cardiovascular;  Laterality: Bilateral;   Stents  August 22, 2011   Bilateral iliac PTA and stenting    Current Outpatient Medications  Medication Sig Dispense Refill   aspirin EC 325 MG tablet Take 325 mg by mouth daily.     atorvastatin (LIPITOR) 80 MG tablet TAKE 1 TABLET DAILY AT 6 PM 90 tablet 0   clopidogrel (PLAVIX) 75 MG tablet TAKE ONE TABLET DAILY 90 tablet 0   desvenlafaxine (PRISTIQ) 25 MG 24  hr tablet TAKE ONE TABLET DAILY 90 tablet 0   dicyclomine (BENTYL) 10 MG capsule TAKE 1 CAPSULE 3 TIMES A DAY BEFORE MEALS 270 capsule 0   famotidine (PEPCID) 20 MG tablet TAKE ONE TABLET TWICE DAILY 60 tablet 2   pantoprazole (PROTONIX) 40 MG tablet TAKE ONE TABLET DAILY 90 tablet 0   potassium chloride (KLOR-CON) 10 MEQ tablet TAKE TWO TABLETS TWICE DAILY 360 tablet 0   meloxicam (MOBIC) 7.5 MG tablet TAKE ONE TABLET DAILY 90 tablet 0   No current facility-administered medications for this visit.    Allergies as of 01/02/2023   (No Known Allergies)    Family History  Problem Relation Age of  Onset   Heart disease Mother    Diabetes Father    Colon cancer Father     Social History   Socioeconomic History   Marital status: Single    Spouse name: Not on file   Number of children: Not on file   Years of education: Not on file   Highest education level: Not on file  Occupational History   Not on file  Tobacco Use   Smoking status: Former    Current packs/day: 0.00    Average packs/day: 1 pack/day for 40.0 years (40.0 ttl pk-yrs)    Types: Cigarettes    Start date: 08/04/1972    Quit date: 08/04/2012    Years since quitting: 10.4   Smokeless tobacco: Never   Tobacco comments:    quit smoking 5 yrs ago. smoked one cigarette yesterday (09/25/2018)  Vaping Use   Vaping status: Former  Substance and Sexual Activity   Alcohol use: Yes    Comment: wine every other day; 2-3 a day on 02/20/2019   Drug use: Yes    Types: Marijuana   Sexual activity: Not on file  Other Topics Concern   Not on file  Social History Narrative   Lives with mother, Doristine Counter, who is currently under Hospice care-06/08/2021.   Brother and sister in law live nearby and helps with pt's mom.   Social Determinants of Health   Financial Resource Strain: Low Risk  (06/12/2022)   Overall Financial Resource Strain (CARDIA)    Difficulty of Paying Living Expenses: Not hard at all  Food Insecurity: No Food Insecurity (06/12/2022)   Hunger Vital Sign    Worried About Running Out of Food in the Last Year: Never true    Ran Out of Food in the Last Year: Never true  Transportation Needs: No Transportation Needs (06/12/2022)   PRAPARE - Administrator, Civil Service (Medical): No    Lack of Transportation (Non-Medical): No  Physical Activity: Inactive (06/12/2022)   Exercise Vital Sign    Days of Exercise per Week: 0 days    Minutes of Exercise per Session: 0 min  Stress: No Stress Concern Present (06/12/2022)   [employer] of Occupational Health - Occupational Stress Questionnaire    Feeling of  Stress : Only a little  Social Connections: Moderately Isolated (06/12/2022)   Social Connection and Isolation Panel [NHANES]    Frequency of Communication with Friends and Family: More than three times a week    Frequency of Social Gatherings with Friends and Family: More than three times a week    Attends Religious Services: 1 to 4 times per year    Active Member of [employer] or Organizations: No    Attends [employer] Meetings: Never    Marital Status: Never married  Review of systems General: negative for malaise, night sweats, fever, chills, weight los Neck: Negative for lumps, goiter, pain and significant neck swelling Resp: Negative for cough, wheezing, dyspnea at rest CV: Negative for chest pain, leg swelling, palpitations, orthopnea GI: denies melena, hematochezia, nausea, vomiting, diarrhea, constipation, dysphagia, odyonophagia, early satiety or unintentional weight loss.  MSK: Negative for joint pain or swelling, back pain, and muscle pain. Derm: Negative for itching or rash Psych: Denies depression, anxiety, memory loss, confusion. No homicidal or suicidal ideation.  Heme: Negative for prolonged bleeding, bruising easily, and swollen nodes. Endocrine: Negative for cold or heat intolerance, polyuria, polydipsia and goiter. Neuro: negative for tremor, gait imbalance, syncope and seizures. The remainder of the review of systems is noncontributory.  Physical Exam: BP (!) 174/90   Pulse 65   Temp 98 F (36.7 C) (Oral)   Ht 5\' 5"  (1.651 m)   Wt 151 lb 11.2 oz (68.8 kg)   BMI 25.24 kg/m  General:   Alert and oriented. No distress noted. Pleasant and cooperative.  Head:  Normocephalic and atraumatic. Eyes:  Conjuctiva clear without scleral icterus. Mouth:  Oral mucosa pink and moist. Good dentition. No lesions. Heart: Normal rate and rhythm, s1 and s2 heart sounds present.  Lungs: Clear lung sounds in all lobes. Respirations equal and unlabored. Abdomen:  +BS, soft,  non-tender and non-distended. No rebound or guarding. No HSM or masses noted. Derm: No palmar erythema or jaundice Msk:  Symmetrical without gross deformities. Normal posture. Extremities:  Without edema. Neurologic:  Alert and  oriented x4 Psych:  Alert and cooperative. Normal mood and affect.  Invalid input(s): "6 MONTHS"   ASSESSMENT: Cameron Ware is a 62 y.o. male presenting today for lower abdominal pain     PLAN:  CT Angio A/p with/wo  2. Schedule screening colonoscopy-ASA III  3. Continue to follow with vascular  All questions were answered, patient verbalized understanding and is in agreement with plan as outlined above.   Follow Up: 3 months   Deborh Pense L. Jeanmarie Hubert, MSN, APRN, AGNP-C Adult-Gerontology Nurse Practitioner [facility]

## 2023-01-02 NOTE — Patient Instructions (Signed)
We will schedule you for a special CT of your abdomen to look at blood flow to the intestines Please keep appt with vascular in October We will update colonoscopy given your family history of colon cancer  Follow up 3 months

## 2023-01-02 NOTE — Telephone Encounter (Signed)
    01/02/23  Cameron Ware 1960-07-23  What type of surgery is being performed? Colonoscopy  When is surgery scheduled? 01/18/23  Clearance to hold Plavix x5 days  Name of physician performing surgery?  Dr. Starleen Arms [facility] at [facility] Phone: [phone] Fax: [phone]  Anethesia type (none, local, MAC, general)? MAC

## 2023-01-03 ENCOUNTER — Ambulatory Visit (HOSPITAL_COMMUNITY): Payer: 59

## 2023-01-03 ENCOUNTER — Ambulatory Visit: Payer: 59

## 2023-01-03 DIAGNOSIS — I771 Stricture of artery: Secondary | ICD-10-CM | POA: Insufficient documentation

## 2023-01-03 DIAGNOSIS — Z800 Family history of malignant neoplasm of digestive organs: Secondary | ICD-10-CM | POA: Insufficient documentation

## 2023-01-09 ENCOUNTER — Ambulatory Visit (HOSPITAL_COMMUNITY)
Admission: RE | Admit: 2023-01-09 | Discharge: 2023-01-09 | Disposition: A | Discharge status: Home / Self Care | Payer: 59 | Source: Ambulatory Visit | Attending: Gastroenterology | Admitting: Gastroenterology

## 2023-01-09 DIAGNOSIS — R1033 Periumbilical pain: Secondary | ICD-10-CM | POA: Diagnosis not present

## 2023-01-09 DIAGNOSIS — I771 Stricture of artery: Secondary | ICD-10-CM | POA: Insufficient documentation

## 2023-01-09 DIAGNOSIS — R109 Unspecified abdominal pain: Secondary | ICD-10-CM | POA: Diagnosis not present

## 2023-01-09 DIAGNOSIS — N200 Calculus of kidney: Secondary | ICD-10-CM | POA: Diagnosis not present

## 2023-01-09 DIAGNOSIS — K5730 Diverticulosis of large intestine without perforation or abscess without bleeding: Secondary | ICD-10-CM | POA: Diagnosis not present

## 2023-01-09 DIAGNOSIS — I700 Atherosclerosis of aorta: Secondary | ICD-10-CM | POA: Diagnosis not present

## 2023-01-09 MED ORDER — IOHEXOL 350 MG/ML SOLN
100.0000 mL | Freq: Once | INTRAVENOUS | Status: AC | PRN
  Administered 2023-01-09: 100 mL via INTRAVENOUS

## 2023-01-11 ENCOUNTER — Other Ambulatory Visit: Payer: Self-pay | Admitting: Gastroenterology

## 2023-01-11 ENCOUNTER — Telehealth (INDEPENDENT_AMBULATORY_CARE_PROVIDER_SITE_OTHER): Payer: Self-pay | Admitting: Gastroenterology

## 2023-01-11 DIAGNOSIS — K551 Chronic vascular disorders of intestine: Secondary | ICD-10-CM

## 2023-01-11 NOTE — Telephone Encounter (Signed)
Left detailed message on pt voicemail informing him of pre op appt.

## 2023-01-12 ENCOUNTER — Telehealth: Payer: Self-pay | Admitting: Vascular Surgery

## 2023-01-12 NOTE — Telephone Encounter (Signed)
-----   Message from Nurse Alger Simons B sent at 01/12/2023  2:31 PM EDT -----  ----- Message ----- From: Maeola Harman, MD Sent: 01/12/2023  12:45 PM EDT To: [name]; Vvs Charge [address]  [name] [phone] [birth year]  I need to see this patient ASAP in the next week or 2 for chronic mesenteric ischemia.  He is an established patient of mine and does not need any further studies.  Cameron Ware

## 2023-01-15 NOTE — Patient Instructions (Signed)
Cameron Ware  01/15/2023     @PREFPERIOPPHARMACY @   Your procedure is scheduled on  01/18/2023.   Report to [facility] at  1100  A.M.   Call this number if you have problems the morning of surgery:  [phone]  If you experience any cold or flu symptoms such as cough, fever, chills, shortness of breath, etc. between now and your scheduled surgery, please notify us at the above number.   Remember:  Follow the diet and prep instructions given to you by the office.  You should have decreased your aspirin to 81 mg and stopped your plavix on 01/12/2023.     Take these medicines the morning of surgery with A SIP OF WATER         pristiq, pepcid, meloxicam(if needed), pantoprazole.     Do not wear jewelry, make-up or nail polish, including gel polish,  artificial nails, or any other type of covering on natural nails (fingers and  toes).  Do not wear lotions, powders, or perfumes, or deodorant.  Do not shave 48 hours prior to surgery.  Men may shave face and neck.  Do not bring valuables to the hospital.  [facility] is not responsible for any belongings or valuables.  Contacts, dentures or bridgework may not be worn into surgery.  Leave your suitcase in the car.  After surgery it may be brought to your room.  For patients admitted to the hospital, discharge time will be determined by your treatment team.  Patients discharged the day of surgery will not be allowed to drive home and must have someone with them for 24 hours.    Special instructions:   DO NOT smoke tobacco or vape for 24 hours before your procedure.  Please read over the following fact sheets that you were given. Anesthesia Post-op Instructions and Care and Recovery After Surgery       Colonoscopy, Adult, Care After The following information offers guidance on how to care for yourself after your procedure. Your health care provider may also give you more specific instructions. If you have  problems or questions, contact your health care provider. What can I expect after the procedure? After the procedure, it is common to have: A small amount of blood in your stool for 24 hours after the procedure. Some gas. Mild cramping or bloating of your abdomen. Follow these instructions at home: Eating and drinking  Drink enough fluid to keep your urine pale yellow. Follow instructions from your health care provider about eating or drinking restrictions. Resume your normal diet as told by your health care provider. Avoid heavy or fried foods that are hard to digest. Activity Rest as told by your health care provider. Avoid sitting for a long time without moving. Get up to take short walks every 1-2 hours. This is important to improve blood flow and breathing. Ask for help if you feel weak or unsteady. Return to your normal activities as told by your health care provider. Ask your health care provider what activities are safe for you. Managing cramping and bloating  Try walking around when you have cramps or feel bloated. If directed, apply heat to your abdomen as told by your health care provider. Use the heat source that your health care provider recommends, such as a moist heat pack or a heating pad. Place a towel between your skin and the heat source. Leave the heat on for 20-30 minutes. Remove the heat if your skin  turns bright red. This is especially important if you are unable to feel pain, heat, or cold. You have a greater risk of getting burned. General instructions If you were given a sedative during the procedure, it can affect you for several hours. Do not drive or operate machinery until your health care provider says that it is safe. For the first 24 hours after the procedure: Do not sign important documents. Do not drink alcohol. Do your regular daily activities at a slower pace than normal. Eat soft foods that are easy to digest. Take over-the-counter and prescription  medicines only as told by your health care provider. Keep all follow-up visits. This is important. Contact a health care provider if: You have blood in your stool 2-3 days after the procedure. Get help right away if: You have more than a small spotting of blood in your stool. You have large blood clots in your stool. You have swelling of your abdomen. You have nausea or vomiting. You have a fever. You have increasing pain in your abdomen that is not relieved with medicine. These symptoms may be an emergency. Get help right away. Call 911. Do not wait to see if the symptoms will go away. Do not drive yourself to the hospital. Summary After the procedure, it is common to have a small amount of blood in your stool. You may also have mild cramping and bloating of your abdomen. If you were given a sedative during the procedure, it can affect you for several hours. Do not drive or operate machinery until your health care provider says that it is safe. Get help right away if you have a lot of blood in your stool, nausea or vomiting, a fever, or increased pain in your abdomen. This information is not intended to replace advice given to you by your health care provider. Make sure you discuss any questions you have with your health care provider. Document Revised: 06/27/2022 Document Reviewed: 01/05/2021 Elsevier Patient Education  2024 [employer] Monitored Anesthesia Care, Care After The following information offers guidance on how to care for yourself after your procedure. Your health care provider may also give you more specific instructions. If you have problems or questions, contact your health care provider. What can I expect after the procedure? After the procedure, it is common to have: Tiredness. Little or no memory about what happened during or after the procedure. Impaired judgment when it comes to making decisions. Nausea or vomiting. Some trouble with balance. Follow these  instructions at home: For the time period you were told by your health care provider:  Rest. Do not participate in activities where you could fall or become injured. Do not drive or use machinery. Do not drink alcohol. Do not take sleeping pills or medicines that cause drowsiness. Do not make important decisions or sign legal documents. Do not take care of children on your own. Medicines Take over-the-counter and prescription medicines only as told by your health care provider. If you were prescribed antibiotics, take them as told by your health care provider. Do not stop using the antibiotic even if you start to feel better. Eating and drinking Follow instructions from your health care provider about what you may eat and drink. Drink enough fluid to keep your urine pale yellow. If you vomit: Drink clear fluids slowly and in small amounts as you are able. Clear fluids include water, ice chips, low-calorie sports drinks, and fruit juice that has water added to it (diluted fruit juice).  Eat light and bland foods in small amounts as you are able. These foods include bananas, applesauce, rice, lean meats, toast, and crackers. General instructions  Have a responsible adult stay with you for the time you are told. It is important to have someone help care for you until you are awake and alert. If you have sleep apnea, surgery and some medicines can increase your risk for breathing problems. Follow instructions from your health care provider about wearing your sleep device: When you are sleeping. This includes during daytime naps. While taking prescription pain medicines, sleeping medicines, or medicines that make you drowsy. Do not use any products that contain nicotine or tobacco. These products include cigarettes, chewing tobacco, and vaping devices, such as e-cigarettes. If you need help quitting, ask your health care provider. Contact a health care provider if: You feel nauseous or vomit  every time you eat or drink. You feel light-headed. You are still sleepy or having trouble with balance after 24 hours. You get a rash. You have a fever. You have redness or swelling around the IV site. Get help right away if: You have trouble breathing. You have new confusion after you get home. These symptoms may be an emergency. Get help right away. Call 911. Do not wait to see if the symptoms will go away. Do not drive yourself to the hospital. This information is not intended to replace advice given to you by your health care provider. Make sure you discuss any questions you have with your health care provider. Document Revised: 10/10/2021 Document Reviewed: 10/10/2021 Elsevier Patient Education  2024 [employer].

## 2023-01-16 ENCOUNTER — Encounter (HOSPITAL_COMMUNITY): Admission: RE | Admit: 2023-01-16 | Payer: 59 | Source: Ambulatory Visit

## 2023-01-16 ENCOUNTER — Other Ambulatory Visit: Payer: Self-pay | Admitting: Family Medicine

## 2023-01-16 ENCOUNTER — Other Ambulatory Visit: Payer: Self-pay

## 2023-01-16 ENCOUNTER — Encounter (HOSPITAL_COMMUNITY): Payer: Self-pay

## 2023-01-16 VITALS — BP 174/88 | HR 65 | Temp 98.0°F | Resp 18 | Ht 65.0 in | Wt 151.7 lb

## 2023-01-16 DIAGNOSIS — Z01818 Encounter for other preprocedural examination: Secondary | ICD-10-CM | POA: Diagnosis present

## 2023-01-16 DIAGNOSIS — F1010 Alcohol abuse, uncomplicated: Secondary | ICD-10-CM

## 2023-01-16 DIAGNOSIS — K219 Gastro-esophageal reflux disease without esophagitis: Secondary | ICD-10-CM

## 2023-01-16 DIAGNOSIS — Z01810 Encounter for preprocedural cardiovascular examination: Secondary | ICD-10-CM | POA: Diagnosis not present

## 2023-01-18 ENCOUNTER — Ambulatory Visit (HOSPITAL_COMMUNITY)
Admission: RE | Admit: 2023-01-18 | Discharge: 2023-01-18 | Disposition: A | Discharge status: Home / Self Care | Payer: 59 | Attending: Gastroenterology | Admitting: Gastroenterology

## 2023-01-18 ENCOUNTER — Ambulatory Visit (HOSPITAL_BASED_OUTPATIENT_CLINIC_OR_DEPARTMENT_OTHER): Payer: 59 | Admitting: Certified Registered"

## 2023-01-18 ENCOUNTER — Encounter (HOSPITAL_COMMUNITY)
Admission: RE | Disposition: A | Discharge status: Home / Self Care | Payer: Self-pay | Source: Home / Self Care | Attending: Gastroenterology

## 2023-01-18 ENCOUNTER — Encounter (HOSPITAL_COMMUNITY): Payer: Self-pay

## 2023-01-18 ENCOUNTER — Ambulatory Visit (HOSPITAL_COMMUNITY): Payer: 59 | Admitting: Certified Registered"

## 2023-01-18 ENCOUNTER — Encounter (INDEPENDENT_AMBULATORY_CARE_PROVIDER_SITE_OTHER): Payer: Self-pay | Admitting: *Deleted

## 2023-01-18 DIAGNOSIS — E7800 Pure hypercholesterolemia, unspecified: Secondary | ICD-10-CM | POA: Insufficient documentation

## 2023-01-18 DIAGNOSIS — K635 Polyp of colon: Secondary | ICD-10-CM | POA: Diagnosis not present

## 2023-01-18 DIAGNOSIS — I10 Essential (primary) hypertension: Secondary | ICD-10-CM | POA: Insufficient documentation

## 2023-01-18 DIAGNOSIS — K648 Other hemorrhoids: Secondary | ICD-10-CM | POA: Diagnosis not present

## 2023-01-18 DIAGNOSIS — I739 Peripheral vascular disease, unspecified: Secondary | ICD-10-CM | POA: Diagnosis not present

## 2023-01-18 DIAGNOSIS — F32A Depression, unspecified: Secondary | ICD-10-CM | POA: Diagnosis not present

## 2023-01-18 DIAGNOSIS — Z8673 Personal history of transient ischemic attack (TIA), and cerebral infarction without residual deficits: Secondary | ICD-10-CM | POA: Diagnosis not present

## 2023-01-18 DIAGNOSIS — Z800 Family history of malignant neoplasm of digestive organs: Secondary | ICD-10-CM | POA: Diagnosis not present

## 2023-01-18 DIAGNOSIS — M1990 Unspecified osteoarthritis, unspecified site: Secondary | ICD-10-CM | POA: Diagnosis not present

## 2023-01-18 DIAGNOSIS — D125 Benign neoplasm of sigmoid colon: Secondary | ICD-10-CM | POA: Diagnosis not present

## 2023-01-18 DIAGNOSIS — K649 Unspecified hemorrhoids: Secondary | ICD-10-CM | POA: Diagnosis not present

## 2023-01-18 DIAGNOSIS — K219 Gastro-esophageal reflux disease without esophagitis: Secondary | ICD-10-CM | POA: Insufficient documentation

## 2023-01-18 DIAGNOSIS — Z1211 Encounter for screening for malignant neoplasm of colon: Secondary | ICD-10-CM | POA: Insufficient documentation

## 2023-01-18 DIAGNOSIS — Z79891 Long term (current) use of opiate analgesic: Secondary | ICD-10-CM | POA: Diagnosis not present

## 2023-01-18 DIAGNOSIS — F17210 Nicotine dependence, cigarettes, uncomplicated: Secondary | ICD-10-CM | POA: Insufficient documentation

## 2023-01-18 DIAGNOSIS — K5730 Diverticulosis of large intestine without perforation or abscess without bleeding: Secondary | ICD-10-CM | POA: Insufficient documentation

## 2023-01-18 DIAGNOSIS — F419 Anxiety disorder, unspecified: Secondary | ICD-10-CM | POA: Diagnosis not present

## 2023-01-18 HISTORY — PX: POLYPECTOMY: SHX5525

## 2023-01-18 HISTORY — PX: COLONOSCOPY WITH PROPOFOL: SHX5780

## 2023-01-18 LAB — HM COLONOSCOPY

## 2023-01-18 SURGERY — COLONOSCOPY WITH PROPOFOL
Anesthesia: General

## 2023-01-18 MED ORDER — PROPOFOL 500 MG/50ML IV EMUL
INTRAVENOUS | Status: DC | PRN
  Administered 2023-01-18: 150 ug/kg/min via INTRAVENOUS

## 2023-01-18 MED ORDER — LACTATED RINGERS IV SOLN: INTRAVENOUS | Status: DC | PRN

## 2023-01-18 MED ORDER — PROPOFOL 1000 MG/100ML IV EMUL
INTRAVENOUS | Status: AC
  Filled 2023-01-18: qty 100

## 2023-01-18 MED ORDER — EPHEDRINE SULFATE-NACL 50-0.9 MG/10ML-% IV SOSY
PREFILLED_SYRINGE | INTRAVENOUS | Status: DC | PRN
Start: 2023-01-18 — End: 2023-01-18
  Administered 2023-01-18: 10 mg via INTRAVENOUS
  Administered 2023-01-18: 5 mg via INTRAVENOUS
  Administered 2023-01-18: 10 mg via INTRAVENOUS

## 2023-01-18 MED ORDER — EPHEDRINE 5 MG/ML INJ
INTRAVENOUS | Status: AC
  Filled 2023-01-18: qty 5

## 2023-01-18 MED ORDER — PHENYLEPHRINE 80 MCG/ML (10ML) SYRINGE FOR IV PUSH (FOR BLOOD PRESSURE SUPPORT)
PREFILLED_SYRINGE | INTRAVENOUS | Status: AC
  Filled 2023-01-18: qty 10

## 2023-01-18 MED ORDER — LIDOCAINE HCL (CARDIAC) PF 100 MG/5ML IV SOSY
PREFILLED_SYRINGE | INTRAVENOUS | Status: DC | PRN
  Administered 2023-01-18: 80 mg via INTRAVENOUS

## 2023-01-18 MED ORDER — GLYCOPYRROLATE PF 0.2 MG/ML IJ SOSY
PREFILLED_SYRINGE | INTRAMUSCULAR | Status: AC
  Filled 2023-01-18: qty 1

## 2023-01-18 MED ORDER — PHENYLEPHRINE 80 MCG/ML (10ML) SYRINGE FOR IV PUSH (FOR BLOOD PRESSURE SUPPORT)
PREFILLED_SYRINGE | INTRAVENOUS | Status: DC | PRN
  Administered 2023-01-18: 160 ug via INTRAVENOUS

## 2023-01-18 MED ORDER — PROPOFOL 10 MG/ML IV BOLUS
INTRAVENOUS | Status: DC | PRN
  Administered 2023-01-18: 100 mg via INTRAVENOUS
  Administered 2023-01-18: 50 mg via INTRAVENOUS
  Administered 2023-01-18: 30 mg via INTRAVENOUS

## 2023-01-18 NOTE — Op Note (Signed)
[facility] Patient Name: Cameron Ware Procedure Date: 01/18/2023 9:48 AM MRN: [id number] Date of Birth: 10/30/1960 Attending MD: Sanjuan Dame , MD, [id number] CSN: [id number] Age: 62 Admit Type: Outpatient Procedure:                Colonoscopy Indications:              Family history of colon cancer in a first-degree                            relative before age 55 years Providers:                Sanjuan Dame, MD, Angelica Ran, Pandora Leiter,                            Technician Referring MD:              Medicines:                Monitored Anesthesia Care Complications:            No immediate complications. Estimated Blood Loss:     Estimated blood loss: none. Procedure:                Pre-Anesthesia Assessment:                           - Prior to the procedure, a History and Physical                            was performed, and patient medications and                            allergies were reviewed. The patient's tolerance of                            previous anesthesia was also reviewed. The risks                            and benefits of the procedure and the sedation                            options and risks were discussed with the patient.                            All questions were answered, and informed consent                            was obtained. Prior Anticoagulants: The patient has                            taken no anticoagulant or antiplatelet agents                            except for aspirin. ASA Grade Assessment: III - A  patient with severe systemic disease. After                            reviewing the risks and benefits, the patient was                            deemed in satisfactory condition to undergo the                            procedure.                           After obtaining informed consent, the colonoscope                            was passed under direct vision. Throughout the                             procedure, the patient's blood pressure, pulse, and                            oxygen saturations were monitored continuously. The                            [id number]) scope was introduced through the                            anus and advanced to the the cecum, identified by                            appendiceal orifice and ileocecal valve. The                            colonoscopy was performed without difficulty. The                            patient tolerated the procedure well. The quality                            of the bowel preparation was evaluated using the                            BBPS [facility] Bowel Preparation Scale) with scores                            of: Right Colon = 2 (minor amount of residual                            staining, small fragments of stool and/or opaque                            liquid, but mucosa seen well), Transverse Colon = 2                            (  minor amount of residual staining, small fragments                            of stool and/or opaque liquid, but mucosa seen                            well) and Left Colon = 2 (minor amount of residual                            staining, small fragments of stool and/or opaque                            liquid, but mucosa seen well). The total BBPS score                            equals 6. The ileocecal valve, appendiceal orifice,                            and rectum were photographed. Scope In: 10:08:38 AM Scope Out: 10:39:29 AM Scope Withdrawal Time: 0 hours 27 minutes 20 seconds  Total Procedure Duration: 0 hours 30 minutes 51 seconds  Findings:      The perianal and digital rectal examinations were normal.      Scattered diverticula were found in the left colon.      A 7 mm polyp was found in the sigmoid colon. The polyp was sessile. The       polyp was removed with a cold snare. Resection and retrieval were       complete.      Non-bleeding external and  internal hemorrhoids were found during       retroflexion. Impression:               - Diverticulosis in the left colon.                           - One 7 mm polyp in the sigmoid colon, removed with                            a cold snare. Resected and retrieved.                           - Non-bleeding external and internal hemorrhoids. Moderate Sedation:      Per Anesthesia Care Recommendation:           - Repeat colonoscopy in 5 years for surveillance.                           - Return to primary care physician as previously                            scheduled. Procedure Code(s):        --- Professional ---                           [phone], Colonoscopy, flexible; with removal of  tumor(s), polyp(s), or other lesion(s) by snare                            technique Diagnosis Code(s):        --- Professional ---                           K64.8, Other hemorrhoids                           D12.5, Benign neoplasm of sigmoid colon                           Z80.0, Family history of malignant neoplasm of                            digestive organs                           K57.30, Diverticulosis of large intestine without                            perforation or abscess without bleeding CPT copyright 2022 American Medical Association. All rights reserved. The codes documented in this report are preliminary and upon coder review may  be revised to meet current compliance requirements. Sanjuan Dame, MD Sanjuan Dame, MD 01/18/2023 10:43:06 AM This report has been signed electronically. Number of Addenda: 0

## 2023-01-18 NOTE — Anesthesia Preprocedure Evaluation (Signed)
Anesthesia Evaluation  Patient identified by MRN, date of birth, ID band Patient awake    Reviewed: Allergy & Precautions, H&P , NPO status , Patient's Chart, lab work & pertinent test results, reviewed documented beta blocker date and time   Airway Mallampati: II  TM Distance: >3 FB Neck ROM: full    Dental no notable dental hx.    Pulmonary neg pulmonary ROS, Current Smoker and Patient abstained from smoking.   Pulmonary exam normal breath sounds clear to auscultation       Cardiovascular Exercise Tolerance: Good hypertension, + Peripheral Vascular Disease  negative cardio ROS  Rhythm:regular Rate:Normal     Neuro/Psych  Headaches PSYCHIATRIC DISORDERS Anxiety Depression    CVA negative neurological ROS  negative psych ROS   GI/Hepatic negative GI ROS, Neg liver ROS,GERD  ,,  Endo/Other  negative endocrine ROS    Renal/GU negative Renal ROS  negative genitourinary   Musculoskeletal   Abdominal   Peds  Hematology negative hematology ROS (+)   Anesthesia Other Findings   Reproductive/Obstetrics negative OB ROS                             Anesthesia Physical Anesthesia Plan  ASA: 2  Anesthesia Plan: General   Post-op Pain Management:    Induction:   PONV Risk Score and Plan: Propofol infusion  Airway Management Planned:   Additional Equipment:   Intra-op Plan:   Post-operative Plan:   Informed Consent: I have reviewed the patients History and Physical, chart, labs and discussed the procedure including the risks, benefits and alternatives for the proposed anesthesia with the patient or authorized representative who has indicated his/her understanding and acceptance.     Dental Advisory Given  Plan Discussed with: CRNA  Anesthesia Plan Comments:        Anesthesia Quick Evaluation

## 2023-01-18 NOTE — Transfer of Care (Signed)
Immediate Anesthesia Transfer of Care Note  Patient: Cameron Ware  Procedure(s) Performed: COLONOSCOPY WITH PROPOFOL POLYPECTOMY  Patient Location: Short Stay  Anesthesia Type:General  Level of Consciousness: drowsy and patient cooperative  Airway & Oxygen Therapy: Patient Spontanous Breathing and Patient connected to nasal cannula oxygen  Post-op Assessment: Report given to RN and Post -op Vital signs reviewed and stable  Post vital signs: Reviewed and stable  Last Vitals:  Vitals Value Taken Time  BP 158/56 01/18/23 1042  Temp 35.6 C 01/18/23 1042  Pulse 56 01/18/23 1042  Resp 14 01/18/23 1042  SpO2 99 % 01/18/23 1042    Last Pain:  Vitals:   01/18/23 1042  TempSrc: Axillary  PainSc: Asleep      Patients Stated Pain Goal: 9 (01/18/23 0804)  Complications: No notable events documented.

## 2023-01-18 NOTE — Interval H&P Note (Signed)
History and Physical Interval Note:  01/18/2023 8:56 AM  Cameron Ware  has presented today for surgery, with the diagnosis of family hx colorectal cancer.  The various methods of treatment have been discussed with the patient and family. After consideration of risks, benefits and other options for treatment, the patient has consented to  Procedure(s) with comments: COLONOSCOPY WITH PROPOFOL (N/A) - 1:00pm;asa 3 as a surgical intervention.  The patient's history has been reviewed, patient examined, no change in status, stable for surgery.  I have reviewed the patient's chart and labs.  Questions were answered to the patient's satisfaction.     [name] Oleda Borski

## 2023-01-18 NOTE — Discharge Instructions (Signed)

## 2023-01-19 LAB — SURGICAL PATHOLOGY

## 2023-01-19 NOTE — Anesthesia Postprocedure Evaluation (Signed)
Anesthesia Post Note  Patient: Cameron Ware  Procedure(s) Performed: COLONOSCOPY WITH PROPOFOL POLYPECTOMY  Patient location during evaluation: Phase II Anesthesia Type: General Level of consciousness: awake Pain management: pain level controlled Vital Signs Assessment: post-procedure vital signs reviewed and stable Respiratory status: spontaneous breathing and respiratory function stable Cardiovascular status: blood pressure returned to baseline and stable Postop Assessment: no headache and no apparent nausea or vomiting Anesthetic complications: no Comments: Late entry   No notable events documented.   Last Vitals:  Vitals:   01/18/23 0804 01/18/23 1042  BP: (!) 146/67 (!) 158/56  Pulse:  (!) 56  Resp: 16 14  Temp: 36.6 C (!) 35.6 C  SpO2: 99% 99%    Last Pain:  Vitals:   01/18/23 1042  TempSrc: Axillary  PainSc: Asleep                 Windell Norfolk

## 2023-01-22 ENCOUNTER — Encounter (HOSPITAL_COMMUNITY): Payer: Self-pay | Admitting: Gastroenterology

## 2023-01-22 ENCOUNTER — Encounter (INDEPENDENT_AMBULATORY_CARE_PROVIDER_SITE_OTHER): Payer: Self-pay | Admitting: *Deleted

## 2023-01-22 NOTE — Progress Notes (Signed)
I reviewed the pathology results. [name], can you send her a letter with the findings as described below please? Repeat colonoscopy in 5 years  Thanks,  Vista Lawman, MD Gastroenterology and Hepatology [facility]  ---------------------------------------------------------------------------------------------  [facility] [address], [place], [place] [address] Phone:  [phone]   01/22/23 [name], [place]   Dear Cameron Ware,  I am writing to inform you that the biopsies taken during your recent endoscopic examination showed: Tubular Adenoma   I am writing to let you know the results of your recent colonoscopy.  You had a total of 1 polyps removed. The pathology came back as "tubular adenoma." These findings are NOT cancer, but had the polyps remained in your colon, they could have turned into cancer.  Given these findings and family history of colon cancer , it is recommended that your next colonoscopy be performed in 5 years.  Please call us at [phone] if you have persistent problems or have questions about your condition that have not been fully answered at this time.  Sincerely,  Vista Lawman, MD Gastroenterology and Hepatology

## 2023-01-24 ENCOUNTER — Ambulatory Visit: Payer: 59 | Admitting: Vascular Surgery

## 2023-01-30 ENCOUNTER — Other Ambulatory Visit: Payer: Self-pay | Admitting: Family Medicine

## 2023-01-30 DIAGNOSIS — I739 Peripheral vascular disease, unspecified: Secondary | ICD-10-CM

## 2023-01-30 NOTE — Addendum Note (Signed)
Addendum  created 01/30/23 1136 by Oletha Cruel, CRNA   Intraprocedure Staff edited

## 2023-02-02 ENCOUNTER — Ambulatory Visit
Admission: RE | Admit: 2023-02-02 | Discharge: 2023-02-02 | Disposition: A | Discharge status: Home / Self Care | Payer: 59 | Source: Ambulatory Visit | Attending: Gastroenterology | Admitting: Gastroenterology

## 2023-02-02 ENCOUNTER — Other Ambulatory Visit: Payer: Self-pay | Admitting: Family Medicine

## 2023-02-02 DIAGNOSIS — E7800 Pure hypercholesterolemia, unspecified: Secondary | ICD-10-CM

## 2023-02-02 DIAGNOSIS — R1030 Lower abdominal pain, unspecified: Secondary | ICD-10-CM

## 2023-02-02 DIAGNOSIS — K551 Chronic vascular disorders of intestine: Secondary | ICD-10-CM

## 2023-02-02 HISTORY — PX: IR RADIOLOGIST EVAL & MGMT: IMG5224

## 2023-02-02 NOTE — Progress Notes (Signed)
Chief Complaint: Patient was seen in virtual telephone consultation for concern for mesenteric ischemia  Referring Physician(s): Carlan,Chelsea L  History of Present Illness: Cameron Ware is a 62 y.o. male with history of peripheral vascular disease, hypercholesterolemia, hypertension, stroke and chronic abdominal pain of uncertain etiology.  Recent CTA abdomen/pelvis demonstrated worsening of severe celiac stenosis and fibrofatty SMA atherosclerotic plaque with associated stenosis.  He describes his abdominal pain as located centrally and in the upper abdomen.  He attributes the pain to drinking a lot over his life.  The pain is constant, and made worse by straining or doing significant physical activity.  He denies post-prandial abdominal pain.  He is able to eat whatever he wants.  He denies weight loss, and has gained about 20 pounds over the past year.  He endorses bilateral lower extremity cramping/aching in his calf and ankle regions, not related to walking or physical activity.  No rest pain at night.  He continues to smoke cigarettes though states he has cut back, now just 2-3 per day.  Currently on high dose statin as well as dual antiplatelet therapy.      Past Medical History:  Diagnosis Date   Anorexia 11/29/2010   Arthritis    Decreased vision    Depression    Dyspnea on exertion    Generalized headaches    High cholesterol 01/30/2017   Hypertension    Peripheral vascular disease (HCC)    Stroke [facility])     Past Surgical History:  Procedure Laterality Date   ABDOMINAL AORTAGRAM N/A 08/22/2011   Procedure: ABDOMINAL Ronny Flurry;  Surgeon: Nada Libman, MD;  Location: [facility] CATH LAB;  Service: Cardiovascular;  Laterality: N/A;   aortogram     COLONOSCOPY N/A 06/07/2017   Procedure: COLONOSCOPY;  Surgeon: Malissa Hippo, MD;  Location: [facility] ENDO SUITE;  Service: Endoscopy;  Laterality: N/A;  150   COLONOSCOPY WITH PROPOFOL N/A 01/18/2023   Procedure: COLONOSCOPY  WITH PROPOFOL;  Surgeon: Franky Macho, MD;  Location: [facility] ENDO SUITE;  Service: Endoscopy;  Laterality: N/A;  1:00pm;asa 3   PERCUTANEOUS STENT INTERVENTION Bilateral 08/22/2011   Procedure: PERCUTANEOUS STENT INTERVENTION;  Surgeon: Nada Libman, MD;  Location: [facility] CATH LAB;  Service: Cardiovascular;  Laterality: Bilateral;   POLYPECTOMY  01/18/2023   Procedure: POLYPECTOMY;  Surgeon: Franky Macho, MD;  Location: [facility] ENDO SUITE;  Service: Endoscopy;;   Stents  August 22, 2011   Bilateral iliac PTA and stenting    Allergies: Patient has no known allergies.  Medications: Prior to Admission medications   Medication Sig Start Date End Date Taking? Authorizing Provider  aspirin EC 325 MG tablet Take 325 mg by mouth daily.    [provider]  atorvastatin (LIPITOR) 80 MG tablet TAKE 1 TABLET DAILY AT 6 PM 10/27/22   [name], FNP  clopidogrel (PLAVIX) 75 MG tablet TAKE ONE TABLET DAILY 01/30/23   [name], FNP  desvenlafaxine (PRISTIQ) 25 MG 24 hr tablet TAKE ONE TABLET DAILY 12/06/22   Gabriel Earing, FNP  dicyclomine (BENTYL) 10 MG capsule TAKE 1 CAPSULE 3 TIMES A DAY BEFORE MEALS 10/27/22   [name], FNP  famotidine (PEPCID) 20 MG tablet TAKE ONE TABLET TWICE DAILY 11/20/22   [name], FNP  meloxicam (MOBIC) 7.5 MG tablet TAKE ONE TABLET DAILY 11/20/22   [name], FNP  pantoprazole (PROTONIX) 40 MG tablet TAKE ONE TABLET DAILY 01/16/23   Rakes, Doralee Albino, FNP  potassium chloride (KLOR-CON) 10 MEQ tablet TAKE TWO TABLETS TWICE DAILY 10/27/22   [name], FNP  pravastatin (PRAVACHOL) 40 MG tablet Take 40 mg by mouth daily.    08/22/11  [provider]     Family History  Problem Relation Age of Onset   Heart disease Mother    Diabetes Father    Colon cancer Father     Social History   Socioeconomic History   Marital status: Single    Spouse name: Not on file   Number of children: Not on file   Years of education: Not on file    Highest education level: Not on file  Occupational History   Not on file  Tobacco Use   Smoking status: Some Days    Current packs/day: 0.00    Average packs/day: 1 pack/day for 40.0 years (40.0 ttl pk-yrs)    Types: Cigarettes    Start date: 08/04/1972    Last attempt to quit: 08/04/2012    Years since quitting: 10.5   Smokeless tobacco: Never   Tobacco comments:    quit smoking 5 yrs ago. smoked one cigarette yesterday (09/25/2018)  Vaping Use   Vaping status: Former  Substance and Sexual Activity   Alcohol use: Yes    Comment: wine every other day; 2-3 a day on 02/20/2019   Drug use: Yes    Frequency: 3.0 times per week    Types: Marijuana   Sexual activity: Not on file  Other Topics Concern   Not on file  Social History Narrative   Lives with mother, Doristine Counter, who is currently under Hospice care-06/08/2021.   Brother and sister in law live nearby and helps with pt's mom.   Social Determinants of Health   Financial Resource Strain: Low Risk  (06/12/2022)   Overall Financial Resource Strain (CARDIA)    Difficulty of Paying Living Expenses: Not hard at all  Food Insecurity: No Food Insecurity (06/12/2022)   Hunger Vital Sign    Worried About Running Out of Food in the Last Year: Never true    Ran Out of Food in the Last Year: Never true  Transportation Needs: No Transportation Needs (06/12/2022)   PRAPARE - Administrator, Civil Service (Medical): No    Lack of Transportation (Non-Medical): No  Physical Activity: Inactive (06/12/2022)   Exercise Vital Sign    Days of Exercise per Week: 0 days    Minutes of Exercise per Session: 0 min  Stress: No Stress Concern Present (06/12/2022)   [employer] of Occupational Health - Occupational Stress Questionnaire    Feeling of Stress : Only a little  Social Connections: Moderately Isolated (06/12/2022)   Social Connection and Isolation Panel [NHANES]    Frequency of Communication with Friends and Family: More than three  times a week    Frequency of Social Gatherings with Friends and Family: More than three times a week    Attends Religious Services: 1 to 4 times per year    Active Member of [employer] or Organizations: No    Attends Banker Meetings: Never    Marital Status: Never married    Review of Systems: A 12 point ROS discussed and pertinent positives are indicated in the HPI above.  All other systems are negative.   Vital Signs: There were no vitals taken for this visit.  No physical examination was performed in lieu of virtual telephone clinic visit.  Imaging: CT Angio Abd/Pel w/ and/or w/o  Result  Date: 01/11/2023 CLINICAL DATA:  62 year old male with postprandial abdominal pain and history of atherosclerosis with abnormal CT abdomen pelvis in 2018. EXAM: CT ANGIOGRAPHY ABDOMEN AND PELVIS WITH CONTRAST AND WITHOUT CONTRAST TECHNIQUE: Multidetector CT imaging of the abdomen and pelvis was performed using the standard protocol during bolus administration of intravenous contrast. Multiplanar reconstructed images and MIPs were obtained and reviewed to evaluate the vascular anatomy. RADIATION DOSE REDUCTION: This exam was performed according to the departmental dose-optimization program which includes automated exposure control, adjustment of the mA and/or kV according to patient size and/or use of iterative reconstruction technique. CONTRAST:  OMNIPAQUE IOHEXOL 350 MG/ML SOLN COMPARISON:  11/13/2016, 06/27/2022 FINDINGS: VASCULAR Aorta: Normal caliber aorta without aneurysm, dissection, vasculitis or significant stenosis. Circumferential atherosclerotic calcifications. Celiac: Ostial occlusion versus severe stenosis, progressed from 2018 comparison. The celiac is patent distally. SMA: The ostium is patent. There is a 1.6 cm mixed calcific and fibrofatty plaque in the proximal SMA resulting in severe focal stenosis, slightly progressed from 2018 comparison. The distal SMA and its branches  appear patent. There is a replaced right hepatic artery arising from the proximal SMA, just distal to the stenosis. Renals: Single bilateral renal arteries are patent without evidence of aneurysm, dissection, vasculitis, fibromuscular dysplasia or significant stenosis. IMA: Patent without evidence of aneurysm, dissection, vasculitis or significant stenosis. Inflow: There are bilateral, non kissing common iliac stents in place which are patent. The bilateral internal iliac arteries are diminutive but appear patent. Bilateral external iliac artery stents in place which appear patent. Proximal Outflow: There is an approximately 1 cm focal occlusion versus severe stenosis in the left common femoral artery in the region of prominent atherosclerotic plaque. The right common femoral artery is patent but diminutive with scattered coarse atherosclerotic calcifications. Veins: The hepatic veins are widely patent. The portal system is widely patent and normal in caliber. The renal veins are patent bilaterally in standard anatomic configuration. No evidence of iliocaval thrombosis or anomaly. Review of the MIP images confirms the above findings. NON-VASCULAR Lower chest: No acute abnormality. Hepatobiliary: No focal liver abnormality is seen. No gallstones, gallbladder wall thickening, or biliary dilatation. Pancreas: Unremarkable. No pancreatic ductal dilatation or surrounding inflammatory changes. Spleen: Normal in size without focal abnormality. Adrenals/Urinary Tract: Adrenal glands are unremarkable. Unchanged nonobstructive right upper pole 4 mm renal calculus. Kidneys are otherwise normal, without focal lesion or hydronephrosis. Bladder is unremarkable. Stomach/Bowel: Stomach is within normal limits. Appendix appears normal. Similar appearing descending and sigmoid diverticula without surrounding inflammatory changes. No evidence of bowel wall thickening, distention, or inflammatory changes. Lymphatic: No abdominopelvic  lymphadenopathy. Reproductive: Prostate gland is normal in size with scattered dystrophic calcifications. Other: No abdominal wall hernia or abnormality. No abdominopelvic ascites. Musculoskeletal: No acute or significant osseous findings. IMPRESSION: VASCULAR 1. Interval progression of celiac ostial severe stenosis and proximal SMA severe stenosis since 2018 comparison. In a patient with postprandial abdominal pain and weight loss, these findings are consistent with chronic mesenteric ischemia. 2. Short segment occlusion versus severe stenosis about the left common femoral artery secondary to fibrofatty atherosclerotic plaque. 3. Patent indwelling bilateral common and external iliac artery stents. 4.  Aortic Atherosclerosis (ICD10-I70.0). NON-VASCULAR 1. No acute abdominopelvic abnormality. 2. Unchanged nonobstructive right nephrolithiasis. 3. Diverticulosis. Marliss Coots, MD [facility] Electronically Signed   By: Marliss Coots M.D.   On: 01/11/2023 12:25    Labs:  CBC: Recent Labs    05/02/22 1057 11/01/22 1040  WBC 8.2 9.9  HGB  16.6 15.4  HCT 48.4 45.1  PLT 303 323    COAGS: No results for input(s): "INR", "APTT" in the last 8760 hours.  BMP: Recent Labs    05/02/22 1057 11/01/22 1040  NA 140 138  K 4.7 4.1  CL 102 101  CO2 25 24  GLUCOSE 100* 96  BUN 14 12  CALCIUM 10.3* 9.8  CREATININE 1.05 0.92    LIVER FUNCTION TESTS: Recent Labs    05/02/22 1057 11/01/22 1040  BILITOT 0.7 0.7  AST 24 16  ALT 26 17  ALKPHOS 119 117  PROT 7.2 7.0  ALBUMIN 4.7 4.3    TUMOR MARKERS: No results for input(s): "AFPTM", "CEA", "CA199", "CHROMGRNA" in the last 8760 hours.  Assessment and Plan: 62 year old male with abdominal pain of indeterminate etiology.  While he has many risk factors for and exhibits significant atherosclerotic disease including focal celiac high grade stenosis/occlusion and prominent proximal SMA  fibrofatty atherosclerotic plaque with associated stenosis, he has no symptoms that are compatible with chronic mesenteric ischemia.  It seems his pain is more musculoskeletal in nature.  There is no indication for intervention at this time.  -plan for 1 year follow up in IR clinic with repeat CTA abdomen/pelvis -agree with continued lipitor and DAPT -encouraged smoking cessation -encouraged follow up with Vascular Surgery  Electronically Signed: Bennie Dallas, MD 02/02/2023, 2:03 PM   I spent a total of  40 Minutes  in virtual telephone clinical consultation, greater than 50% of which was counseling/coordinating care for chronic abdominal pain.

## 2023-02-08 ENCOUNTER — Ambulatory Visit (INDEPENDENT_AMBULATORY_CARE_PROVIDER_SITE_OTHER): Payer: 59 | Admitting: Family Medicine

## 2023-02-08 ENCOUNTER — Encounter: Payer: Self-pay | Admitting: Family Medicine

## 2023-02-08 VITALS — BP 122/62 | HR 78 | Temp 97.8°F | Ht 65.0 in | Wt 152.4 lb

## 2023-02-08 DIAGNOSIS — F411 Generalized anxiety disorder: Secondary | ICD-10-CM | POA: Diagnosis not present

## 2023-02-08 DIAGNOSIS — E782 Mixed hyperlipidemia: Secondary | ICD-10-CM | POA: Diagnosis not present

## 2023-02-08 DIAGNOSIS — I70213 Atherosclerosis of native arteries of extremities with intermittent claudication, bilateral legs: Secondary | ICD-10-CM | POA: Diagnosis not present

## 2023-02-08 DIAGNOSIS — F331 Major depressive disorder, recurrent, moderate: Secondary | ICD-10-CM | POA: Diagnosis not present

## 2023-02-08 DIAGNOSIS — F1010 Alcohol abuse, uncomplicated: Secondary | ICD-10-CM

## 2023-02-08 DIAGNOSIS — K219 Gastro-esophageal reflux disease without esophagitis: Secondary | ICD-10-CM

## 2023-02-08 DIAGNOSIS — R1030 Lower abdominal pain, unspecified: Secondary | ICD-10-CM

## 2023-02-08 DIAGNOSIS — Z23 Encounter for immunization: Secondary | ICD-10-CM

## 2023-02-08 DIAGNOSIS — Z1283 Encounter for screening for malignant neoplasm of skin: Secondary | ICD-10-CM

## 2023-02-08 DIAGNOSIS — N521 Erectile dysfunction due to diseases classified elsewhere: Secondary | ICD-10-CM

## 2023-02-08 DIAGNOSIS — R748 Abnormal levels of other serum enzymes: Secondary | ICD-10-CM | POA: Diagnosis not present

## 2023-02-08 MED ORDER — FAMOTIDINE 20 MG PO TABS: 20.0000 mg | ORAL_TABLET | Freq: Two times a day (BID) | ORAL | 1 refills | Status: DC

## 2023-02-08 MED ORDER — DESVENLAFAXINE SUCCINATE ER 25 MG PO TB24
25.0000 mg | ORAL_TABLET | Freq: Every day | ORAL | 1 refills | Status: DC
Start: 2023-02-08 — End: 2023-03-06

## 2023-02-08 MED ORDER — SILDENAFIL CITRATE 50 MG PO TABS
50.0000 mg | ORAL_TABLET | Freq: Every day | ORAL | 0 refills | Status: AC | PRN
Start: 2023-02-08 — End: ?

## 2023-02-08 MED ORDER — ATORVASTATIN CALCIUM 80 MG PO TABS: 80.0000 mg | ORAL_TABLET | Freq: Every day | ORAL | 1 refills | Status: DC

## 2023-02-08 NOTE — Progress Notes (Signed)
Subjective:  [name], male    DOB: [birth year], 62 y.o.   MRN: [id number]  Patient Care Team: [name], FNP as PCP - [facility] ([facility]) [facility], [facility] Jodie Echevaria Osie Cheeks, MD as Referring Physician (Cardiology)   Chief Complaint:  Medical Management of Chronic Issues (3 month chronic follow up )   HPI: Cameron Ware is a 62 y.o. male presenting on 02/08/2023 for Medical Management of Chronic Issues (3 month chronic follow up )    1. Moderate episode of recurrent major depressive disorder (HCC) 2. GAD Compliant with current medications and tolerating well. States he is doing well.     11/01/2022   10:17 AM 06/12/2022    1:15 PM 05/02/2022   10:45 AM 01/31/2022    3:02 PM 07/29/2021    3:31 PM  Depression screen PHQ 2/9  Decreased Interest 0 0 0 0 0  Down, Depressed, Hopeless 0 0 0 1 0  PHQ - 2 Score 0 0 0 1 0  Altered sleeping 0 0 0 0 0  Tired, decreased energy 0 0 0 1 1  Change in appetite 0 0 0 0 0  Feeling bad or failure about yourself  0 0 0 0 1  Trouble concentrating 0 0 0 0 0  Moving slowly or fidgety/restless 0 0 0 0 0  Suicidal thoughts 0 0 0 0 0  PHQ-9 Score 0 0 0 2 2  Difficult doing work/chores Not difficult at all    Not difficult at all      05/02/2022   10:46 AM 01/31/2022    3:02 PM 07/29/2021    3:31 PM 06/01/2021   10:09 AM  GAD 7 : Generalized Anxiety Score  Nervous, Anxious, on Edge 0 1 3 3   Control/stop worrying 0 1 1 3   Worry too much - different things 0 1 1 3   Trouble relaxing 0 0 0 3  Restless 0 0 0 3  Easily annoyed or irritable 0 0 1 3  Afraid - awful might happen 0 0 1 3  Total GAD 7 Score 0 3 7 21   Anxiety Difficulty  Not difficult at all Not difficult at all Not difficult at all    3. Lower abdominal pain This is chronic in nature and is followed by GI and vascular on a regular basis. Nothing new or worsening.   4. Mixed hyperlipidemia Compliant with medications - Yes Current  medications - lipitor Side effects from medications - No Diet - general Exercise - none   5. Atherosclerosis of native artery of both lower extremities with intermittent claudication (HCC) He is on ASA and plavix. He is followed by Vascular on a regular basis. Reports minimal claudication symptoms.   6. Gastroesophageal reflux disease without esophagitis Compliant with medications and is followed by GI on a regular basis. No red flags. Does have chronic abdominal pain.   7. Alcohol abuse Continues to drink. States he has cut back significantly. Still drinks daily   8. ED Pt states he is unable to sustain an erection, states he is able to achieve one but can not sustain one.   Relevant past medical, surgical, family, and social history reviewed and updated as indicated.  Allergies and medications reviewed and updated. Data reviewed: Chart in Epic.   Past Medical History:  Diagnosis Date   Anorexia 11/29/2010   Arthritis    Decreased vision    Depression    Dyspnea on exertion  Generalized headaches    High cholesterol 01/30/2017   Hypertension    Peripheral vascular disease (HCC)    Stroke [facility])     Past Surgical History:  Procedure Laterality Date   ABDOMINAL AORTAGRAM N/A 08/22/2011   Procedure: ABDOMINAL Ronny Flurry;  Surgeon: Nada Libman, MD;  Location: [facility] CATH LAB;  Service: Cardiovascular;  Laterality: N/A;   aortogram     COLONOSCOPY N/A 06/07/2017   Procedure: COLONOSCOPY;  Surgeon: Malissa Hippo, MD;  Location: [facility] ENDO SUITE;  Service: Endoscopy;  Laterality: N/A;  150   COLONOSCOPY WITH PROPOFOL N/A 01/18/2023   Procedure: COLONOSCOPY WITH PROPOFOL;  Surgeon: Franky Macho, MD;  Location: [facility] ENDO SUITE;  Service: Endoscopy;  Laterality: N/A;  1:00pm;asa 3   IR RADIOLOGIST EVAL & MGMT  02/02/2023   PERCUTANEOUS STENT INTERVENTION Bilateral 08/22/2011   Procedure: PERCUTANEOUS STENT INTERVENTION;  Surgeon: Nada Libman, MD;  Location: [facility] CATH LAB;   Service: Cardiovascular;  Laterality: Bilateral;   POLYPECTOMY  01/18/2023   Procedure: POLYPECTOMY;  Surgeon: Franky Macho, MD;  Location: [facility] ENDO SUITE;  Service: Endoscopy;;   Stents  August 22, 2011   Bilateral iliac PTA and stenting    Social History   Socioeconomic History   Marital status: Single    Spouse name: Not on file   Number of children: Not on file   Years of education: Not on file   Highest education level: Not on file  Occupational History   Not on file  Tobacco Use   Smoking status: Some Days    Current packs/day: 0.00    Average packs/day: 1 pack/day for 40.0 years (40.0 ttl pk-yrs)    Types: Cigarettes    Start date: 08/04/1972    Last attempt to quit: 08/04/2012    Years since quitting: 10.5   Smokeless tobacco: Never   Tobacco comments:    quit smoking 5 yrs ago. smoked one cigarette yesterday (09/25/2018)  Vaping Use   Vaping status: Former  Substance and Sexual Activity   Alcohol use: Yes    Comment: wine every other day; 2-3 a day on 02/20/2019   Drug use: Yes    Frequency: 3.0 times per week    Types: Marijuana   Sexual activity: Not on file  Other Topics Concern   Not on file  Social History Narrative   Lives with mother, Doristine Counter, who is currently under Hospice care-06/08/2021.   Brother and sister in law live nearby and helps with pt's mom.   Social Determinants of Health   Financial Resource Strain: Low Risk  (06/12/2022)   Overall Financial Resource Strain (CARDIA)    Difficulty of Paying Living Expenses: Not hard at all  Food Insecurity: No Food Insecurity (06/12/2022)   Hunger Vital Sign    Worried About Running Out of Food in the Last Year: Never true    Ran Out of Food in the Last Year: Never true  Transportation Needs: No Transportation Needs (06/12/2022)   PRAPARE - Administrator, Civil Service (Medical): No    Lack of Transportation (Non-Medical): No  Physical Activity: Inactive (06/12/2022)   Exercise Vital Sign     Days of Exercise per Week: 0 days    Minutes of Exercise per Session: 0 min  Stress: No Stress Concern Present (06/12/2022)   [employer] of Occupational Health - Occupational Stress Questionnaire    Feeling of Stress : Only a little  Social Connections: Moderately Isolated (06/12/2022)  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]    Frequency of Communication with Friends and Family: More than three times a week    Frequency of Social Gatherings with Friends and Family: More than three times a week    Attends Religious Services: 1 to 4 times per year    Active Member of [employer] or Organizations: No    Attends Banker Meetings: Never    Marital Status: Never married  Intimate Partner Violence: Not At Risk (06/12/2022)   Humiliation, Afraid, Rape, and Kick questionnaire    Fear of Current or Ex-Partner: No    Emotionally Abused: No    Physically Abused: No    Sexually Abused: No    Outpatient Encounter Medications as of 02/08/2023  Medication Sig   aspirin EC 81 MG tablet Take 81 mg by mouth daily. Swallow whole.   clopidogrel (PLAVIX) 75 MG tablet TAKE ONE TABLET DAILY   dicyclomine (BENTYL) 10 MG capsule TAKE 1 CAPSULE 3 TIMES A DAY BEFORE MEALS   meloxicam (MOBIC) 7.5 MG tablet TAKE ONE TABLET DAILY   pantoprazole (PROTONIX) 40 MG tablet TAKE ONE TABLET DAILY   potassium chloride (KLOR-CON) 10 MEQ tablet TAKE TWO TABLETS TWICE DAILY   sildenafil (VIAGRA) 50 MG tablet Take 1 tablet (50 mg total) by mouth daily as needed for erectile dysfunction.   [DISCONTINUED] atorvastatin (LIPITOR) 80 MG tablet TAKE 1 TABLET DAILY AT 6 PM   [DISCONTINUED] desvenlafaxine (PRISTIQ) 25 MG 24 hr tablet TAKE ONE TABLET DAILY   [DISCONTINUED] famotidine (PEPCID) 20 MG tablet TAKE ONE TABLET TWICE DAILY   atorvastatin (LIPITOR) 80 MG tablet Take 1 tablet (80 mg total) by mouth daily.   desvenlafaxine (PRISTIQ) 25 MG 24 hr tablet Take 1 tablet (25 mg total) by mouth daily.   famotidine  (PEPCID) 20 MG tablet Take 1 tablet (20 mg total) by mouth 2 (two) times daily.   [DISCONTINUED] aspirin EC 325 MG tablet Take 325 mg by mouth daily.   [DISCONTINUED] pravastatin (PRAVACHOL) 40 MG tablet Take 40 mg by mouth daily.     No facility-administered encounter medications on file as of 02/08/2023.    No Known Allergies  Review of Systems  Constitutional:  Negative for activity change, appetite change, chills, diaphoresis, fatigue, fever and unexpected weight change.  HENT: Negative.    Eyes: Negative.  Negative for photophobia and visual disturbance.  Respiratory:  Negative for cough, chest tightness and shortness of breath.   Cardiovascular:  Negative for chest pain, palpitations and leg swelling.  Gastrointestinal:  Positive for abdominal pain. Negative for abdominal distention, anal bleeding, blood in stool, constipation, diarrhea, nausea, rectal pain and vomiting.  Endocrine: Negative.  Negative for polydipsia, polyphagia and polyuria.  Genitourinary:  Negative for dysuria, frequency and urgency.       ED  Musculoskeletal:  Negative for arthralgias and myalgias.  Skin: Negative.   Allergic/Immunologic: Negative.   Neurological:  Negative for dizziness, tremors, seizures, syncope, facial asymmetry, speech difficulty, weakness, light-headedness, numbness and headaches.  Hematological: Negative.   Psychiatric/Behavioral:  Negative for confusion, hallucinations, sleep disturbance and suicidal ideas.   All other systems reviewed and are negative.       Objective:  BP 122/62   Pulse 78   Temp 97.8 F (36.6 C) (Temporal)   Ht 5\' 5"  (1.651 m)   Wt 152 lb 6.4 oz (69.1 kg)   SpO2 96%   BMI 25.36 kg/m    Wt Readings from Last 3 Encounters:  02/08/23 152  lb 6.4 oz (69.1 kg)  01/16/23 151 lb 11.2 oz (68.8 kg)  01/02/23 151 lb 11.2 oz (68.8 kg)    Physical Exam Vitals and nursing note reviewed.  Constitutional:      General: He is not in acute distress.    Appearance:  Normal appearance. He is well-developed and well-groomed. He is not ill-appearing, toxic-appearing or diaphoretic.  HENT:     Head: Normocephalic and atraumatic.     Jaw: There is normal jaw occlusion.     Right Ear: Hearing normal.     Left Ear: Hearing normal.     Nose: Nose normal.     Mouth/Throat:     Lips: Pink.     Mouth: Mucous membranes are moist.     Pharynx: Oropharynx is clear. Uvula midline.  Eyes:     General: Lids are normal.     Extraocular Movements: Extraocular movements intact.     Conjunctiva/sclera: Conjunctivae normal.     Pupils: Pupils are equal, round, and reactive to light.  Neck:     Thyroid: No thyroid mass, thyromegaly or thyroid tenderness.     Vascular: No carotid bruit or JVD.     Trachea: Trachea and phonation normal.  Cardiovascular:     Rate and Rhythm: Normal rate and regular rhythm.     Chest Wall: PMI is not displaced.     Pulses: Normal pulses.     Heart sounds: Normal heart sounds. No murmur heard.    No friction rub. No gallop.  Pulmonary:     Effort: Pulmonary effort is normal. No respiratory distress.     Breath sounds: Normal breath sounds. No wheezing.  Abdominal:     General: Bowel sounds are normal. There is no distension or abdominal bruit.     Palpations: Abdomen is soft. There is no hepatomegaly or splenomegaly.     Tenderness: There is no abdominal tenderness. There is no right CVA tenderness or left CVA tenderness.     Hernia: No hernia is present.  Musculoskeletal:        General: Normal range of motion.     Cervical back: Normal range of motion and neck supple.     Right lower leg: No edema.     Left lower leg: No edema.  Lymphadenopathy:     Cervical: No cervical adenopathy.  Skin:    General: Skin is warm and dry.     Capillary Refill: Capillary refill takes less than 2 seconds.     Coloration: Skin is not cyanotic, jaundiced or pale.     Findings: No rash.  Neurological:     General: No focal deficit present.      Mental Status: He is alert and oriented to person, place, and time.     Sensory: Sensation is intact.     Motor: Motor function is intact.     Coordination: Coordination is intact.     Gait: Gait is intact.     Deep Tendon Reflexes: Reflexes are normal and symmetric.  Psychiatric:        Attention and Perception: Attention and perception normal.        Mood and Affect: Mood and affect normal.        Speech: Speech normal.        Behavior: Behavior normal. Behavior is cooperative.        Thought Content: Thought content normal.        Cognition and Memory: Cognition and memory normal.  Judgment: Judgment normal.     Results for orders placed or performed in visit on 01/18/23  HM COLONOSCOPY  Result Value Ref Range   HM Colonoscopy See Report (in chart) See Report (in chart), Patient Reported       Pertinent labs & imaging results that were available during my care of the patient were reviewed by me and considered in my medical decision making.  Assessment & Plan:  Bron was seen today for medical management of chronic issues.  Diagnoses and all orders for this visit:  Mixed hyperlipidemia Diet encouraged - increase intake of fresh fruits and vegetables, increase intake of lean proteins. Bake, broil, or grill foods. Avoid fried, greasy, and fatty foods. Avoid fast foods. Increase intake of fiber-rich whole grains. Exercise encouraged - at least 150 minutes per week and advance as tolerated.  Goal BMI < 25. Continue medications as prescribed. Follow up in 3-6 months as discussed.  -     atorvastatin (LIPITOR) 80 MG tablet; Take 1 tablet (80 mg total) by mouth daily. -     CMP14+EGFR -     Lipid panel  Moderate episode of recurrent major depressive disorder (HCC) Generalized anxiety disorder Doing well on below. Will continue.  -     desvenlafaxine (PRISTIQ) 25 MG 24 hr tablet; Take 1 tablet (25 mg total) by mouth daily. -     Thyroid Panel With TSH  Lower abdominal  pain Followed by GI on a regular basis. Continue below.  -     famotidine (PEPCID) 20 MG tablet; Take 1 tablet (20 mg total) by mouth 2 (two) times daily. -     CBC with Differential/Platelet -     CMP14+EGFR  Atherosclerosis of native artery of both lower extremities with intermittent claudication (HCC) Followed by vascular on a regular basis, continue ASA and Plavix. Labs pending.  -     CBC with Differential/Platelet -     CMP14+EGFR -     Lipid panel  Gastroesophageal reflux disease without esophagitis No red flags present. Diet discussed. Avoid fried, spicy, fatty, greasy, and acidic foods. Avoid caffeine, nicotine, and alcohol. Do not eat 2-3 hours before bedtime and stay upright for at least 1-2 hours after eating. Eat small frequent meals. Avoid NSAID's like motrin and aleve. Medications as prescribed. Report any new or worsening symptoms. Follow up as discussed or sooner if needed.   -     famotidine (PEPCID) 20 MG tablet; Take 1 tablet (20 mg total) by mouth 2 (two) times daily. -     CBC with Differential/Platelet  Alcohol abuse Cessation emphasized. Will check for deficiencies of below.  -     Vitamin B12 -     Folate -     Vitamin B1  Skin cancer screening -     Ambulatory referral to Dermatology  Encounter for immunization -     Flu vaccine trivalent PF, 6mos and older(Flulaval,Afluria,Fluarix,Fluzone)  Erectile dysfunction due to diseases classified elsewhere Will trial below. Red flags of use discussed in detail.  -     sildenafil (VIAGRA) 50 MG tablet; Take 1 tablet (50 mg total) by mouth daily as needed for erectile dysfunction.     Continue all other maintenance medications.  Follow up plan: Return in about 6 months (around 08/08/2023), or if symptoms worsen or fail to improve, for CPE.   Continue healthy lifestyle choices, including diet (rich in fruits, vegetables, and lean proteins, and low in salt and simple carbohydrates) and exercise (  at least 30  minutes of moderate physical activity daily).   The above assessment and management plan was discussed with the patient. The patient verbalized understanding of and has agreed to the management plan. Patient is aware to call the clinic if they develop any new symptoms or if symptoms persist or worsen. Patient is aware when to return to the clinic for a follow-up visit. Patient educated on when it is appropriate to go to the emergency department.   Kari Baars, FNP-C [facility] [phone]

## 2023-02-12 LAB — CBC WITH DIFFERENTIAL/PLATELET
Basophils Absolute: 0.1 10*3/uL (ref 0.0–0.2)
Basos: 1 %
EOS (ABSOLUTE): 0.3 10*3/uL (ref 0.0–0.4)
Eos: 3 %
Hematocrit: 47.9 % (ref 37.5–51.0)
Hemoglobin: 15.7 g/dL (ref 13.0–17.7)
Immature Grans (Abs): 0 10*3/uL (ref 0.0–0.1)
Immature Granulocytes: 0 %
Lymphocytes Absolute: 2.2 10*3/uL (ref 0.7–3.1)
Lymphs: 23 %
MCH: 31.2 pg (ref 26.6–33.0)
MCHC: 32.8 g/dL (ref 31.5–35.7)
MCV: 95 fL (ref 79–97)
Monocytes Absolute: 1.1 10*3/uL — ABNORMAL HIGH (ref 0.1–0.9)
Monocytes: 12 %
Neutrophils Absolute: 6 10*3/uL (ref 1.4–7.0)
Neutrophils: 61 %
Platelets: 318 10*3/uL (ref 150–450)
RBC: 5.03 x10E6/uL (ref 4.14–5.80)
RDW: 12.4 % (ref 11.6–15.4)
WBC: 9.6 10*3/uL (ref 3.4–10.8)

## 2023-02-12 LAB — CMP14+EGFR
ALT: 17 IU/L (ref 0–44)
AST: 20 IU/L (ref 0–40)
Albumin: 4.6 g/dL (ref 3.9–4.9)
Alkaline Phosphatase: 124 IU/L — ABNORMAL HIGH (ref 44–121)
BUN/Creatinine Ratio: 31 — ABNORMAL HIGH (ref 10–24)
BUN: 26 mg/dL (ref 8–27)
Bilirubin Total: 0.4 mg/dL (ref 0.0–1.2)
CO2: 25 mmol/L (ref 20–29)
Calcium: 10.3 mg/dL — ABNORMAL HIGH (ref 8.6–10.2)
Chloride: 102 mmol/L (ref 96–106)
Creatinine, Ser: 0.84 mg/dL (ref 0.76–1.27)
Globulin, Total: 2.5 g/dL (ref 1.5–4.5)
Glucose: 97 mg/dL (ref 70–99)
Potassium: 4.5 mmol/L (ref 3.5–5.2)
Sodium: 143 mmol/L (ref 134–144)
Total Protein: 7.1 g/dL (ref 6.0–8.5)
eGFR: 98 mL/min/{1.73_m2} (ref >59)

## 2023-02-12 LAB — LIPID PANEL
Chol/HDL Ratio: 2.8 ratio (ref 0.0–5.0)
Cholesterol, Total: 176 mg/dL (ref 100–199)
HDL: 63 mg/dL (ref >39)
LDL Chol Calc (NIH): 93 mg/dL (ref 0–99)
Triglycerides: 111 mg/dL (ref 0–149)
VLDL Cholesterol Cal: 20 mg/dL (ref 5–40)

## 2023-02-12 LAB — THYROID PANEL WITH TSH
Free Thyroxine Index: 1.8 (ref 1.2–4.9)
T3 Uptake Ratio: 26 % (ref 24–39)
T4, Total: 6.8 ug/dL (ref 4.5–12.0)
TSH: 3.21 u[IU]/mL (ref 0.450–4.500)

## 2023-02-12 LAB — FOLATE: Folate: >20 ng/mL (ref >3.0)

## 2023-02-12 LAB — VITAMIN B12: Vitamin B-12: 568 pg/mL (ref 232–1245)

## 2023-02-12 LAB — VITAMIN B1: Thiamine: 188.1 nmol/L (ref 66.5–200.0)

## 2023-02-14 LAB — ALKALINE PHOSPHATASE, ISOENZYMES
Alkaline Phosphatase: 126 IU/L — ABNORMAL HIGH (ref 44–121)
BONE FRACTION: 22 % (ref 12–68)
INTESTINAL FRAC.: 14 % (ref 0–18)
LIVER FRACTION: 64 % (ref 13–88)

## 2023-02-14 LAB — SPECIMEN STATUS REPORT

## 2023-02-20 ENCOUNTER — Other Ambulatory Visit: Payer: Self-pay | Admitting: Family Medicine

## 2023-02-20 DIAGNOSIS — R1030 Lower abdominal pain, unspecified: Secondary | ICD-10-CM

## 2023-03-02 ENCOUNTER — Other Ambulatory Visit: Payer: Self-pay | Admitting: Family Medicine

## 2023-03-02 DIAGNOSIS — R1030 Lower abdominal pain, unspecified: Secondary | ICD-10-CM

## 2023-03-06 ENCOUNTER — Other Ambulatory Visit: Payer: Self-pay | Admitting: Family Medicine

## 2023-03-06 DIAGNOSIS — D225 Melanocytic nevi of trunk: Secondary | ICD-10-CM | POA: Diagnosis not present

## 2023-03-06 DIAGNOSIS — L570 Actinic keratosis: Secondary | ICD-10-CM | POA: Diagnosis not present

## 2023-03-06 DIAGNOSIS — X32XXXA Exposure to sunlight, initial encounter: Secondary | ICD-10-CM | POA: Diagnosis not present

## 2023-03-06 DIAGNOSIS — F411 Generalized anxiety disorder: Secondary | ICD-10-CM

## 2023-03-06 DIAGNOSIS — Z1283 Encounter for screening for malignant neoplasm of skin: Secondary | ICD-10-CM | POA: Diagnosis not present

## 2023-03-06 DIAGNOSIS — F331 Major depressive disorder, recurrent, moderate: Secondary | ICD-10-CM

## 2023-03-28 ENCOUNTER — Ambulatory Visit (HOSPITAL_COMMUNITY)
Admission: RE | Admit: 2023-03-28 | Discharge: 2023-03-28 | Disposition: A | Discharge status: Home / Self Care | Payer: 59 | Source: Ambulatory Visit | Attending: Physician Assistant | Admitting: Physician Assistant

## 2023-03-28 ENCOUNTER — Ambulatory Visit (INDEPENDENT_AMBULATORY_CARE_PROVIDER_SITE_OTHER)
Admission: RE | Admit: 2023-03-28 | Discharge: 2023-03-28 | Disposition: A | Discharge status: Home / Self Care | Payer: 59 | Source: Ambulatory Visit | Attending: Physician Assistant | Admitting: Physician Assistant

## 2023-03-28 ENCOUNTER — Ambulatory Visit (INDEPENDENT_AMBULATORY_CARE_PROVIDER_SITE_OTHER): Payer: 59 | Admitting: Physician Assistant

## 2023-03-28 VITALS — BP 143/70 | HR 67 | Temp 97.8°F | Ht 65.0 in | Wt 151.8 lb

## 2023-03-28 DIAGNOSIS — K551 Chronic vascular disorders of intestine: Secondary | ICD-10-CM | POA: Diagnosis not present

## 2023-03-28 DIAGNOSIS — I739 Peripheral vascular disease, unspecified: Secondary | ICD-10-CM | POA: Diagnosis not present

## 2023-03-28 LAB — VAS US ABI WITH/WO TBI
Left ABI: 0.56
Right ABI: 0.49

## 2023-03-28 NOTE — Progress Notes (Signed)
Office Note     CC:  follow up Requesting Provider:  [name], FNP  HPI: Cameron Ware is a 62 y.o. ([birth year]) male who presents for routine follow up of PAD and mesenteric stenosis. He has remote history of bilateral common iliac artery stents in 2013 by Dr. Randie Heinz. He does have claudication but this has not been lifestyle limiting.   Today he is here with his Partner Cameron Ware. He reports continued discomfort in his legs mostly with exertion or when he is going up incline. He denies any pain at rest. No non healing wounds. He also reports this lower abdominal pain. He says it feels like something is tearing apart on his skin. The pain doesn't radiate. It is constantly there but also worsened with exertion or any force like pushing or pulling. He denies any back pain. He does not have any loss of appetite, weight loss, food fear, post prandial pain, nausea or vomiting. He is followed by GI. He also asks about erectile dysfunction. He is currently taking Viagra but this is not effective. He is medically managed on Aspirin, statin, Plavix. He is current smoker. He had stopped for a couple years but he has since restarted. Smokes 1/4 ppd.   Past Medical History:  Diagnosis Date   Anorexia 11/29/2010   Arthritis    Decreased vision    Depression    Dyspnea on exertion    Generalized headaches    High cholesterol 01/30/2017   Hypertension    Peripheral vascular disease (HCC)    Stroke [facility])     Past Surgical History:  Procedure Laterality Date   ABDOMINAL AORTAGRAM N/A 08/22/2011   Procedure: ABDOMINAL Ronny Flurry;  Surgeon: Nada Libman, MD;  Location: [facility] CATH LAB;  Service: Cardiovascular;  Laterality: N/A;   aortogram     COLONOSCOPY N/A 06/07/2017   Procedure: COLONOSCOPY;  Surgeon: Malissa Hippo, MD;  Location: [facility] ENDO SUITE;  Service: Endoscopy;  Laterality: N/A;  150   COLONOSCOPY WITH PROPOFOL N/A 01/18/2023   Procedure: COLONOSCOPY WITH PROPOFOL;  Surgeon: Franky Macho, MD;  Location: [facility] ENDO SUITE;  Service: Endoscopy;  Laterality: N/A;  1:00pm;asa 3   IR RADIOLOGIST EVAL & MGMT  02/02/2023   PERCUTANEOUS STENT INTERVENTION Bilateral 08/22/2011   Procedure: PERCUTANEOUS STENT INTERVENTION;  Surgeon: Nada Libman, MD;  Location: [facility] CATH LAB;  Service: Cardiovascular;  Laterality: Bilateral;   POLYPECTOMY  01/18/2023   Procedure: POLYPECTOMY;  Surgeon: Franky Macho, MD;  Location: [facility] ENDO [facility]ITE;  Service: Endoscopy;;   Stents  August 22, 2011   Bilateral iliac PTA and stenting    Social History   Socioeconomic History   Marital status: Single    Spouse name: Not on file   Number of children: Not on file   Years of education: Not on file   Highest education level: Not on file  Occupational History   Not on file  Tobacco Use   Smoking status: Some Days    Current packs/day: 0.00    Average packs/day: 1 pack/day for 40.0 years (40.0 ttl pk-yrs)    Types: Cigarettes    Start date: 08/04/1972    Last attempt to quit: 08/04/2012    Years since quitting: 10.6   Smokeless tobacco: Never   Tobacco comments:    quit smoking 5 yrs ago. smoked one cigarette yesterday (09/25/2018)  Vaping Use   Vaping status: Former  Substance and Sexual Activity   Alcohol use: Yes  Comment: wine every other day; 2-3 a day on 02/20/2019   Drug use: Yes    Frequency: 3.0 times per week    Types: Marijuana   Sexual activity: Not on file  Other Topics Concern   Not on file  Social History Narrative   Lives with mother, Doristine Counter, who is currently under Hospice care-06/08/2021.   Brother and sister in law live nearby and helps with pt's mom.   Social Determinants of Health   Financial Resource Strain: Low Risk  (06/12/2022)   Overall Financial Resource Strain (CARDIA)    Difficulty of Paying Living Expenses: Not hard at all  Food Insecurity: No Food Insecurity (06/12/2022)   Hunger Vital Sign    Worried About Running Out of Food in the Last Year: Never true     Ran Out of Food in the Last Year: Never true  Transportation Needs: No Transportation Needs (06/12/2022)   PRAPARE - Administrator, Civil Service (Medical): No    Lack of Transportation (Non-Medical): No  Physical Activity: Inactive (06/12/2022)   Exercise Vital Sign    Days of Exercise per Week: 0 days    Minutes of Exercise per Session: 0 min  Stress: No Stress Concern Present (06/12/2022)   [employer] of Occupational Health - Occupational Stress Questionnaire    Feeling of Stress : Only a little  Social Connections: Moderately Isolated (06/12/2022)   Social Connection and Isolation Panel [NHANES]    Frequency of Communication with Friends and Family: More than three times a week    Frequency of Social Gatherings with Friends and Family: More than three times a week    Attends Religious Services: 1 to 4 times per year    Active Member of [employer] or Organizations: No    Attends Banker Meetings: Never    Marital Status: Never married  Intimate Partner Violence: Not At Risk (06/12/2022)   Humiliation, Afraid, Rape, and Kick questionnaire    Fear of Current or Ex-Partner: No    Emotionally Abused: No    Physically Abused: No    Sexually Abused: No    Family History  Problem Relation Age of Onset   Heart disease Mother    Diabetes Father    Colon cancer Father     Current Outpatient Medications  Medication Sig Dispense Refill   aspirin EC 81 MG tablet Take 81 mg by mouth daily. Swallow whole.     atorvastatin (LIPITOR) 80 MG tablet Take 1 tablet (80 mg total) by mouth daily. 90 tablet 1   clopidogrel (PLAVIX) 75 MG tablet TAKE ONE TABLET DAILY 90 tablet 0   desvenlafaxine (PRISTIQ) 25 MG 24 hr tablet TAKE ONE TABLET DAILY 90 tablet 0   dicyclomine (BENTYL) 10 MG capsule TAKE 1 CAPSULE 3 TIMES A DAY BEFORE MEALS 90 capsule 0   famotidine (PEPCID) 20 MG tablet Take 1 tablet (20 mg total) by mouth 2 (two) times daily. 180 tablet 1   meloxicam (MOBIC)  7.5 MG tablet TAKE ONE TABLET DAILY 90 tablet 1   pantoprazole (PROTONIX) 40 MG tablet TAKE ONE TABLET DAILY 90 tablet 0   potassium chloride (KLOR-CON) 10 MEQ tablet TAKE TWO TABLETS TWICE DAILY 120 tablet 0   sildenafil (VIAGRA) 50 MG tablet Take 1 tablet (50 mg total) by mouth daily as needed for erectile dysfunction. 10 tablet 0   No current facility-administered medications for this visit.    No Known Allergies   REVIEW OF SYSTEMS:  [X]   denotes positive finding, [ ]  denotes negative finding Cardiac  Comments:  Chest pain or chest pressure:    Shortness of breath upon exertion:    Short of breath when lying flat:    Irregular heart rhythm:        Vascular    Pain in calf, thigh, or hip brought on by ambulation:    Pain in feet at night that wakes you up from your sleep:     Blood clot in your veins:    Leg swelling:         Pulmonary    Oxygen at home:    Productive cough:     Wheezing:         Neurologic    Sudden weakness in arms or legs:     Sudden numbness in arms or legs:     Sudden onset of difficulty speaking or slurred speech:    Temporary loss of vision in one eye:     Problems with dizziness:         Gastrointestinal    Blood in stool:     Vomited blood:         Genitourinary    Burning when urinating:     Blood in urine:        Psychiatric    Major depression:         Hematologic    Bleeding problems:    Problems with blood clotting too easily:        Skin    Rashes or ulcers:        Constitutional    Fever or chills:      PHYSICAL EXAMINATION:  Vitals:   03/28/23 1029  BP: (!) 143/70  Pulse: 67  Temp: 97.8 F (36.6 C)  SpO2: 95%  Weight: 151 lb 12.8 oz (68.9 kg)  Height: 5\' 5"  (1.651 m)    General:  WDWN in NAD; vital signs documented above Gait: Normal HENT: WNL, normocephalic Pulmonary: normal non-labored breathing , without wheezing Cardiac: regular HR Abdomen: protuberant soft, normal BS, ttp around umbilicus, does have  small umbilical hernia, reducible Vascular Exam/Pulses: 1+ femoral, No palpable distal pulses Extremities: without ischemic changes, without Gangrene , without cellulitis; without open wounds;  Musculoskeletal: no muscle wasting or atrophy  Neurologic: A&O X 3 Psychiatric:  The pt has Normal affect.   Non-Invasive Vascular Imaging:   +-------+-----------+-----------+------------+------------+  ABI/TBIToday's ABIToday's TBIPrevious ABIPrevious TBI  +-------+-----------+-----------+------------+------------+  Right 0.49       0.37       0.82        0.51          +-------+-----------+-----------+------------+------------+  Left  0.56       0.28       0.8         0.5           +-------+-----------+-----------+------------+------------+   Abdominal Aorta Findings:  +-------------+-------+----------+----------+----------+--------+--------+  Location    AP (cm)Trans (cm)PSV (cm/s)Waveform  ThrombusComments  +-------------+-------+----------+----------+----------+--------+--------+  Distal                       49                                    +-------------+-------+----------+----------+----------+--------+--------+  RT CIA Prox                   65  monophasic                  +-------------+-------+----------+----------+----------+--------+--------+  RT CIA Mid                    73        monophasic                  +-------------+-------+----------+----------+----------+--------+--------+  RT CIA Distal                 62        monophasic                  +-------------+-------+----------+----------+----------+--------+--------+  RT EIA Prox                   70        monophasic                  +-------------+-------+----------+----------+----------+--------+--------+  RT EIA Mid                    51        monophasic                   +-------------+-------+----------+----------+----------+--------+--------+  RT EIA Distal                 423       monophasic                  +-------------+-------+----------+----------+----------+--------+--------+  LT CIA Prox                   59        monophasic                  +-------------+-------+----------+----------+----------+--------+--------+  LT CIA Mid                    89        monophasic                  +-------------+-------+----------+----------+----------+--------+--------+  LT CIA Distal                 87        monophasic                  +-------------+-------+----------+----------+----------+--------+--------+  LT EIA Prox                   82        monophasic                  +-------------+-------+----------+----------+----------+--------+--------+  LT EIA Mid                    120       monophasic                  +-------------+-------+----------+----------+----------+--------+--------+  LT EIA Distal                 167       monophasic                  +-------------+-------+----------+----------+----------+--------+--------+   Left CFA 266 cm/s.    Summary:  Stenosis: Patent bilateral CIA and EIA stents. >70% stenosis in the distal right EIA. No sigficant stenosis noted in the distal EIA as seen on previous exam. 50-74% stenosis in the left CFA.    ASSESSMENT/PLAN::  62 y.o. male here for follow up for PAD. He continues to have claudication symptoms. This remains non lifestyle limiting. He has no rest pain or tissue loss. He has some known mesenteric disease. He has no clear mesenteric ischemia symptoms. He is followed by GI. Recent CT does show significant celiac stenosis and SMA stenosis. Patent IMA. Severe aortic atherosclerosis. Some of this has increased. However, in setting of no acute symptoms no intervention would be recommended at this time.  -Bilateral ABIs significantly decreased from  prior study 1 year ago - Duplex shows right EIA stenosis. This was also present on prior duplex. Velocities have increased. The Left EIA stenosis previously seen is not reproducible today. He does have stenosis of left CFA artery as well - encourage exercise regimen to help develop collaterals - Continue Aspirin, Statin, Plavix - I had a very long discussion with patient about importance of smoking cessation and its impact on his PAD as well as mesenteric disease. He expressed his understanding in needing to stop - I will have him follow up in 6 months with repeat ABI, mesenteric duplex and Aorto/iliac duplex  Graceann Congress, PA-C [facility] [phone]  Clinic MD:   Randie Heinz

## 2023-04-02 ENCOUNTER — Other Ambulatory Visit: Payer: Self-pay | Admitting: Family Medicine

## 2023-04-02 DIAGNOSIS — R1030 Lower abdominal pain, unspecified: Secondary | ICD-10-CM

## 2023-04-10 ENCOUNTER — Encounter (INDEPENDENT_AMBULATORY_CARE_PROVIDER_SITE_OTHER): Payer: Self-pay | Admitting: Gastroenterology

## 2023-04-10 ENCOUNTER — Ambulatory Visit (INDEPENDENT_AMBULATORY_CARE_PROVIDER_SITE_OTHER): Payer: 59 | Admitting: Gastroenterology

## 2023-04-10 VITALS — BP 149/78 | HR 76 | Temp 97.5°F | Ht 65.0 in | Wt 153.3 lb

## 2023-04-10 DIAGNOSIS — R1030 Lower abdominal pain, unspecified: Secondary | ICD-10-CM | POA: Diagnosis not present

## 2023-04-10 NOTE — Progress Notes (Signed)
Referring Provider: [name], FNP Primary Care Physician:  [name], FNP Primary GI Physician: Dr. Tasia Catchings   Chief Complaint  Patient presents with   Follow-up    Patient here today for a follow up on periumbilical area. Patient says he is still having this issues. He says he is taking Bentyl 10 mg TID.    HPI:   Cameron Ware is a 62 y.o. male with past medical history of arthritis, depression, high cholesterol, HTN, PVD, stroke   Patient presenting today for follow up of lower abdominal pain  Last seen August, at that time reported ongoing periumbilical abdominal pain over the past couple of years.  Reporting anything he eats makes pain worse.  Weight relatively stable.  Having a BM daily.  Taking dicyclomine 10 mg 3 times daily as needed without much improvement.  Recommended CT angio abdominal pelvis with and without contrast further evaluation, schedule screening colonoscopy, continue to follow with vascular surgery  CTA as outlined below, patient was referred to Dr. Elby Showers for further evaluation though was not felt that chronic mesenteric ischemia was causing his symptoms.  He was advised to follow-up in IR clinic with repeat CTA in 1 year, continue Lipitor and Plavix, smoking cessation and continue to follow with vascular surgery  He saw vascular surgery on 10/30 where he was encouraged to exercise, continue aspirin, statin, Plavix, smoking cessation.   1. Interval progression of celiac ostial severe stenosis and proximal SMA severe stenosis since 2018 comparison. In a patient with postprandial abdominal pain and weight loss, these findings are consistent with chronic mesenteric ischemia. 2. Short segment occlusion versus severe stenosis about the left common femoral artery secondary to fibro fatty atherosclerotic plaque. 3. Patent indwelling bilateral common and external iliac artery stents. 4.  Aortic Atherosclerosis (ICD10-I70.0).   1. No acute  abdominopelvic abnormality. 2. Unchanged nonobstructive right nephrolithiasis. 3. Diverticulosis.   Present:  Recent CMP, CBC WNL  Today, patient reports ongoing lower abdominal pain. Having a BM daily. Eating does not seem to change his pain. No rectal bleeding or melena. He is taking bentyl TID which does not seem to help his pain too much. He notes that doing strenuous activity such as picking up heavy stuff, pushing, pulling tends to worsen his pain. Denies nausea or vomiting. Denies radiation of pain to his back. He follows up with Vascular surgeon again in 6 months. He is still smoking maybe 2-3 cigarettes per day, vs previously smoking a PPD.  GERD well controlled on famotidine 20mg  BID and protonix 40mg  daily. Appetite is good. Weight is stable.    Last Colonoscopy: 12/2022- Diverticulosis in the left colon.                           - One 7 mm polyp in the sigmoid colon, removed with                            a cold snare. Resected and retrieved.                           - Non-bleeding external and internal hemorrhoids. (1 TA)  Recommendations:  Repeat colonoscopy 5 years   Past Medical History:  Diagnosis Date   Anorexia 11/29/2010   Arthritis    Decreased vision    Depression    Dyspnea on exertion  Generalized headaches    High cholesterol 01/30/2017   Hypertension    Peripheral vascular disease (HCC)    Stroke [facility])     Past Surgical History:  Procedure Laterality Date   ABDOMINAL AORTAGRAM N/A 08/22/2011   Procedure: ABDOMINAL Ronny Flurry;  Surgeon: Nada Libman, MD;  Location: [facility] CATH LAB;  Service: Cardiovascular;  Laterality: N/A;   aortogram     COLONOSCOPY N/A 06/07/2017   Procedure: COLONOSCOPY;  Surgeon: Malissa Hippo, MD;  Location: [facility];  Service: Endoscopy;  Laterality: N/A;  150   COLONOSCOPY WITH PROPOFOL N/A 01/18/2023   Procedure: COLONOSCOPY WITH PROPOFOL;  Surgeon: Franky Macho, MD;  Location: [facility];  Service: Endoscopy;   Laterality: N/A;  1:00pm;asa 3   IR RADIOLOGIST EVAL & MGMT  02/02/2023   PERCUTANEOUS STENT INTERVENTION Bilateral 08/22/2011   Procedure: PERCUTANEOUS STENT INTERVENTION;  Surgeon: Nada Libman, MD;  Location: [facility] CATH LAB;  Service: Cardiovascular;  Laterality: Bilateral;   POLYPECTOMY  01/18/2023   Procedure: POLYPECTOMY;  Surgeon: Franky Macho, MD;  [facility];  Service: Endoscopy;;   Stents  August 22, 2011   Bilateral iliac PTA and stenting    Current Outpatient Medications  Medication Sig Dispense Refill   aspirin EC 81 MG tablet Take 81 mg by mouth daily. Swallow whole.     atorvastatin (LIPITOR) 80 MG tablet Take 1 tablet (80 mg total) by mouth daily. 90 tablet 1   clopidogrel (PLAVIX) 75 MG tablet TAKE ONE TABLET DAILY 90 tablet 0   desvenlafaxine (PRISTIQ) 25 MG 24 hr tablet TAKE ONE TABLET DAILY 90 tablet 0   dicyclomine (BENTYL) 10 MG capsule TAKE 1 CAPSULE 3 TIMES A DAY BEFORE MEALS 90 capsule 0   famotidine (PEPCID) 20 MG tablet Take 1 tablet (20 mg total) by mouth 2 (two) times daily. 180 tablet 1   meloxicam (MOBIC) 7.5 MG tablet TAKE ONE TABLET DAILY 90 tablet 1   pantoprazole (PROTONIX) 40 MG tablet TAKE ONE TABLET DAILY 90 tablet 0   potassium chloride (KLOR-CON) 10 MEQ tablet TAKE TWO TABLETS TWICE DAILY 120 tablet 5   sildenafil (VIAGRA) 50 MG tablet Take 1 tablet (50 mg total) by mouth daily as needed for erectile dysfunction. 10 tablet 0   No current facility-administered medications for this visit.    Allergies as of 04/10/2023   (No Known Allergies)    Family History  Problem Relation Age of Onset   Heart disease Mother    Diabetes Father    Colon cancer Father     Social History   Socioeconomic History   Marital status: Single    Spouse name: Not on file   Number of children: Not on file   Years of education: Not on file   Highest education level: Not on file  Occupational History   Not on file  Tobacco Use   Smoking status:  Some Days    Current packs/day: 0.00    Average packs/day: 1 pack/day for 40.0 years (40.0 ttl pk-yrs)    Types: Cigarettes    Start date: 08/04/1972    Last attempt to quit: 08/04/2012    Years since quitting: 10.6   Smokeless tobacco: Never   Tobacco comments:    quit smoking 5 yrs ago. smoked one cigarette yesterday (09/25/2018)  Vaping Use   Vaping status: Former  Substance and Sexual Activity   Alcohol use: Yes    Comment: wine every other day; 2-3 a  day on 02/20/2019   Drug use: Yes    Frequency: 3.0 times per week    Types: Marijuana   Sexual activity: Not on file  Other Topics Concern   Not on file  Social History Narrative   Lives with mother, Doristine Counter, who is currently under Hospice care-06/08/2021.   Brother and sister in law live nearby and helps with pt's mom.   Social Determinants of Health   Financial Resource Strain: Low Risk  (06/12/2022)   Overall Financial Resource Strain (CARDIA)    Difficulty of Paying Living Expenses: Not hard at all  Food Insecurity: No Food Insecurity (06/12/2022)   Hunger Vital Sign    Worried About Running Out of Food in the Last Year: Never true    Ran Out of Food in the Last Year: Never true  Transportation Needs: No Transportation Needs (06/12/2022)   PRAPARE - Administrator, Civil Service (Medical): No    Lack of Transportation (Non-Medical): No  Physical Activity: Inactive (06/12/2022)   Exercise Vital Sign    Days of Exercise per Week: 0 days    Minutes of Exercise per Session: 0 min  Stress: No Stress Concern Present (06/12/2022)   [employer] of Occupational Health - Occupational Stress Questionnaire    Feeling of Stress : Only a little  Social Connections: Moderately Isolated (06/12/2022)   Social Connection and Isolation Panel [NHANES]    Frequency of Communication with Friends and Family: More than three times a week    Frequency of Social Gatherings with Friends and Family: More than three times a week     Attends Religious Services: 1 to 4 times per year    Active Member of [employer] or Organizations: No    Attends Engineer, structural: Never    Marital Status: Never married   Review of systems General: negative for malaise, night sweats, fever, chills, weight los Neck: Negative for lumps, goiter, pain and significant neck swelling Resp: Negative for cough, wheezing, dyspnea at rest CV: Negative for chest pain, leg swelling, palpitations, orthopnea GI: denies melena, hematochezia, nausea, vomiting, diarrhea, constipation, dysphagia, odyonophagia, early satiety or unintentional weight loss. +Lower abdominal discomfort  The remainder of the review of systems is noncontributory.  Physical Exam: BP (!) 149/78 (BP Location: Right Arm, Patient Position: Sitting, Cuff Size: Normal)   Pulse 76   Temp (!) 97.5 F (36.4 C) (Temporal)   Ht 5\' 5"  (1.651 m)   Wt 153 lb 4.8 oz (69.5 kg)   BMI 25.51 kg/m  General:   Alert and oriented. No distress noted. Pleasant and cooperative.  Head:  Normocephalic and atraumatic. Eyes:  Conjuctiva clear without scleral icterus. Mouth:  Oral mucosa pink and moist. Good dentition. No lesions. Heart: Normal rate and rhythm, s1 and s2 heart sounds present.  Lungs: Clear lung sounds in all lobes. Respirations equal and unlabored. Abdomen:  +BS, soft, non-tender and non-distended. No rebound or guarding. No HSM or masses noted. Derm: No palmar erythema or jaundice Neurologic:  Alert and  oriented x4 Psych:  Alert and cooperative. Normal mood and affect.  Invalid input(s): "6 MONTHS"   ASSESSMENT: KADDEN DEVONE is a 62 y.o. male presenting today for follow up of lower abdominal pain  Workup for lower abdominal pain thus far has been unremarkable. Recent CTA A/P concerning for possible mesenteric ischemia though he was evaluated by Dr. Elby Showers with IR who did not feel this was the cause of his symptoms. Recent colonoscopy with 1  TA. No rectal bleeding,  melena, constipation or diarrhea. Pain worse with strenuous activity. His weight Is stable. Suspect his pain is more musculoskeletal  in nature.  No further GI evaluation warranted at this time.    PLAN:  Continue to follow with IR  2. Continue to follow with vascular surgery  3. Pt to make me aware of new or worsening GI symptoms   All questions were answered, patient verbalized understanding and is in agreement with plan as outlined above.   Follow Up: PRN   Eliyas Suddreth L. Jeanmarie Hubert, MSN, APRN, AGNP-C Adult-Gerontology Nurse Practitioner [facility]

## 2023-04-10 NOTE — Patient Instructions (Signed)
Please continue to follow with vascular surgeon and Dr. Elby Showers with IR I suspect your abdominal pain may be muscular in nature, no further GI evaluation needed at this time  Follow up as needed

## 2023-04-13 ENCOUNTER — Other Ambulatory Visit: Payer: Self-pay

## 2023-04-13 DIAGNOSIS — K551 Chronic vascular disorders of intestine: Secondary | ICD-10-CM

## 2023-04-13 DIAGNOSIS — I739 Peripheral vascular disease, unspecified: Secondary | ICD-10-CM

## 2023-04-17 ENCOUNTER — Other Ambulatory Visit: Payer: Self-pay | Admitting: Family Medicine

## 2023-04-17 DIAGNOSIS — K219 Gastro-esophageal reflux disease without esophagitis: Secondary | ICD-10-CM

## 2023-04-18 DIAGNOSIS — L820 Inflamed seborrheic keratosis: Secondary | ICD-10-CM | POA: Diagnosis not present

## 2023-04-18 DIAGNOSIS — L818 Other specified disorders of pigmentation: Secondary | ICD-10-CM | POA: Diagnosis not present

## 2023-04-30 ENCOUNTER — Other Ambulatory Visit: Payer: Self-pay | Admitting: Family Medicine

## 2023-04-30 DIAGNOSIS — R1030 Lower abdominal pain, unspecified: Secondary | ICD-10-CM

## 2023-05-01 ENCOUNTER — Other Ambulatory Visit: Payer: Self-pay | Admitting: Family Medicine

## 2023-05-01 DIAGNOSIS — I739 Peripheral vascular disease, unspecified: Secondary | ICD-10-CM

## 2023-06-04 ENCOUNTER — Other Ambulatory Visit: Payer: Self-pay | Admitting: Family Medicine

## 2023-06-04 DIAGNOSIS — R1030 Lower abdominal pain, unspecified: Secondary | ICD-10-CM

## 2023-06-14 ENCOUNTER — Ambulatory Visit: Payer: 59

## 2023-06-14 VITALS — Ht 65.0 in | Wt 153.0 lb

## 2023-06-14 DIAGNOSIS — Z0000 Encounter for general adult medical examination without abnormal findings: Secondary | ICD-10-CM | POA: Diagnosis not present

## 2023-06-14 NOTE — Patient Instructions (Addendum)
Cameron Ware , Thank you for taking time to come for your Medicare Wellness Visit. I appreciate your ongoing commitment to your health goals. Please review the following plan we discussed and let me know if I can assist you in the future.   Referrals/Orders/Follow-Ups/Clinician Recommendations: Aim for 30 minutes of exercise or brisk walking, 6-8 glasses of water, and 5 servings of fruits and vegetables each day.  This is a list of the screening recommended for you and due dates:  Health Maintenance  Topic Date Due   Pneumococcal Vaccination (1 of 2 - PCV) Never done   Screening for Lung Cancer  02/08/2024*   Medicare Annual Wellness Visit  06/13/2024   Colon Cancer Screening  01/18/2028   DTaP/Tdap/Td vaccine (2 - Td or Tdap) 03/24/2029   Flu Shot  Completed   Hepatitis C Screening  Completed   HIV Screening  Completed   Zoster (Shingles) Vaccine  Completed   HPV Vaccine  Aged Out   COVID-19 Vaccine  Discontinued  *Topic was postponed. The date shown is not the original due date.    Advanced directives: (ACP Link)Information on Advanced Care Planning can be found at [facility] [place] Advance Health Care Directives Advance Health Care Directives ([email or web address]   Next Medicare Annual Wellness Visit scheduled for next year: Yes

## 2023-06-14 NOTE — Progress Notes (Signed)
Subjective:   Cameron Ware is a 63 y.o. male who presents for Medicare Annual/Subsequent preventive examination.  Visit Complete: Virtual I connected with  Cameron Ware on 06/14/23 by a audio enabled telemedicine application and verified that I am speaking with the correct person using two identifiers.  Patient Location: Home  Provider Location: Home Office  This patient declined Interactive audio and video telecommunications. Therefore the visit was completed with audio only.  I discussed the limitations of evaluation and management by telemedicine. The patient expressed understanding and agreed to proceed.  Vital Signs: Because this visit was a virtual/telehealth visit, some criteria may be missing or patient reported. Any vitals not documented were not able to be obtained and vitals that have been documented are patient reported.  Cardiac Risk Factors include: advanced age (>48men, >45 women);dyslipidemia;hypertension;male gender     Objective:    Today's Vitals   06/14/23 0842  Weight: 153 lb (69.4 kg)  Height: 5\' 5"  (1.651 m)   Body mass index is 25.46 kg/m.     06/14/2023    9:07 AM 01/16/2023    1:45 PM 06/12/2022    1:16 PM 06/08/2021    1:25 PM 08/25/2019    9:11 AM 09/25/2018    9:50 PM 06/07/2017   11:26 AM  Advanced Directives  Does Patient Have a Medical Advance Directive? No No No No No No No  Would patient like information on creating a medical advance directive? Yes (MAU/Ambulatory/Procedural Areas - Information given) No - Patient declined No - Patient declined No - Patient declined No - Patient declined No - Patient declined No - Patient declined    Current Medications (verified) Outpatient Encounter Medications as of 06/14/2023  Medication Sig   aspirin EC 81 MG tablet Take 81 mg by mouth daily. Swallow whole.   atorvastatin (LIPITOR) 80 MG tablet Take 1 tablet (80 mg total) by mouth daily.   clopidogrel (PLAVIX) 75 MG tablet TAKE ONE  TABLET DAILY   desvenlafaxine (PRISTIQ) 25 MG 24 hr tablet TAKE ONE TABLET DAILY   dicyclomine (BENTYL) 10 MG capsule TAKE ONE CAPSULE THREE TIMES DAILY BEFORE MEALS   famotidine (PEPCID) 20 MG tablet Take 1 tablet (20 mg total) by mouth 2 (two) times daily.   meloxicam (MOBIC) 7.5 MG tablet TAKE ONE TABLET DAILY   pantoprazole (PROTONIX) 40 MG tablet TAKE ONE TABLET DAILY   potassium chloride (KLOR-CON) 10 MEQ tablet TAKE TWO TABLETS TWICE DAILY   sildenafil (VIAGRA) 50 MG tablet Take 1 tablet (50 mg total) by mouth daily as needed for erectile dysfunction.   [DISCONTINUED] pravastatin (PRAVACHOL) 40 MG tablet Take 40 mg by mouth daily.     No facility-administered encounter medications on file as of 06/14/2023.    Allergies (verified) Patient has no known allergies.   History: Past Medical History:  Diagnosis Date   Anorexia 11/29/2010   Arthritis    Decreased vision    Depression    Dyspnea on exertion    Generalized headaches    High cholesterol 01/30/2017   Hypertension    Peripheral vascular disease (HCC)    Stroke [facility])    Past Surgical History:  Procedure Laterality Date   ABDOMINAL AORTAGRAM N/A 08/22/2011   Procedure: ABDOMINAL Ronny Flurry;  Surgeon: Nada Libman, MD;  Location: [facility] CATH LAB;  Service: Cardiovascular;  Laterality: N/A;   aortogram     COLONOSCOPY N/A 06/07/2017   Procedure: COLONOSCOPY;  Surgeon: Malissa Hippo, MD;  Location: [facility] ENDO SUITE;  Service: Endoscopy;  Laterality: N/A;  150   COLONOSCOPY WITH PROPOFOL N/A 01/18/2023   Procedure: COLONOSCOPY WITH PROPOFOL;  Surgeon: Franky Macho, MD;  Location: [facility] ENDO SUITE;  Service: Endoscopy;  Laterality: N/A;  1:00pm;asa 3   IR RADIOLOGIST EVAL & MGMT  02/02/2023   PERCUTANEOUS STENT INTERVENTION Bilateral 08/22/2011   Procedure: PERCUTANEOUS STENT INTERVENTION;  Surgeon: Nada Libman, MD;  Location: [facility] CATH LAB;  Service: Cardiovascular;  Laterality: Bilateral;   POLYPECTOMY  01/18/2023    Procedure: POLYPECTOMY;  Surgeon: Franky Macho, MD;  Location: [facility] END[facility] SU[facility];  Service: Endoscopy;;   Stents  August 22, 2011   Bilateral iliac PTA and stenting   Family History  Problem Relation Age of Onset   Heart disease Mother    Diabetes Father    Colon cancer Father    Social History   Socioeconomic History   Marital status: Single    Spouse name: Not on file   Number of children: Not on file   Years of education: Not on file   Highest education level: Not on file  Occupational History   Not on file  Tobacco Use   Smoking status: Some Days    Current packs/day: 0.00    Average packs/day: 1 pack/day for 40.0 years (40.0 ttl pk-yrs)    Types: Cigarettes    Start date: 08/04/1972    Last attempt to quit: 08/04/2012    Years since quitting: 10.8   Smokeless tobacco: Never   Tobacco comments:    quit smoking 5 yrs ago. smoked one cigarette yesterday (09/25/2018)  Vaping Use   Vaping status: Former  Substance and Sexual Activity   Alcohol use: Yes    Comment: wine every other day; 2-3 a day on 02/20/2019   Drug use: Yes    Frequency: 3.0 times per week    Types: Marijuana   Sexual activity: Not on file  Other Topics Concern   Not on file  Social History Narrative   Lives with mother, Doristine Counter, who is currently under Hospice care-06/08/2021.   Brother and sister in law live nearby and helps with pt's mom.   Social Drivers of Corporate investment banker Strain: Low Risk  (06/14/2023)   Overall Financial Resource Strain (CARDIA)    Difficulty of Paying Living Expenses: Not hard at all  Food Insecurity: No Food Insecurity (06/14/2023)   Hunger Vital Sign    Worried About Running Out of Food in the Last Year: Never true    Ran Out of Food in the Last Year: Never true  Transportation Needs: No Transportation Needs (06/14/2023)   PRAPARE - Administrator, Civil Service (Medical): No    Lack of Transportation (Non-Medical): No  Physical Activity: Inactive  (06/14/2023)   Exercise Vital Sign    Days of Exercise per Week: 0 days    Minutes of Exercise per Session: 0 min  Stress: No Stress Concern Present (06/14/2023)   [employer] of Occupational Health - Occupational Stress Questionnaire    Feeling of Stress : Only a little  Social Connections: Moderately Isolated (06/14/2023)   Social Connection and Isolation Panel [NHANES]    Frequency of Communication with Friends and Family: More than three times a week    Frequency of Social Gatherings with Friends and Family: Three times a week    Attends Religious Services: 1 to 4 times per year    Active Member of Clubs or Organizations: No  Attends Banker Meetings: Never    Marital Status: Never married    Tobacco Counseling Ready to quit: Not Answered Counseling given: Not Answered Tobacco comments: quit smoking 5 yrs ago. smoked one cigarette yesterday (09/25/2018)   Clinical Intake:  Pre-visit preparation completed: Yes  Pain : No/denies pain  Diabetes: No  How often do you need to have someone help you when you read instructions, pamphlets, or other written materials from your doctor or pharmacy?: 1 - Never  Interpreter Needed?: No  Information entered by :: Kandis Fantasia LPN   Activities of Daily Living    06/14/2023    9:18 AM 01/16/2023    1:42 PM  In your present state of health, do you have any difficulty performing the following activities:  Hearing? 0 0  Vision? 0 0  Difficulty concentrating or making decisions? 0 0  Walking or climbing stairs? 0 1  Dressing or bathing? 0 0  Doing errands, shopping? 0   Preparing Food and eating ? N   Using the Toilet? N   In the past six months, have you accidently leaked urine? N   Do you have problems with loss of bowel control? N   Managing your Medications? N   Managing your Finances? N   Housekeeping or managing your Housekeeping? N     Patient Care Team: [name], FNP as PCP - [facility], [facility] Jodie Echevaria Osie Cheeks, MD as Referring Physician (Cardiology)  Indicate any recent Medical Services you may have received from other than [facility] providers in the past year (date may be approximate).     Assessment:   This is a routine wellness examination for Williams.  Hearing/Vision screen Hearing Screening - Comments:: Denies hearing difficulties   Vision Screening - Comments:: No vision problems; will schedule routine eye exam soon     Goals Addressed             This Visit's Progress    Improve GI symptoms        Depression Screen    06/14/2023    9:06 AM 11/01/2022   10:17 AM 06/12/2022    1:15 PM 05/02/2022   10:45 AM 01/31/2022    3:02 PM 07/29/2021    3:31 PM 06/08/2021    1:21 PM  PHQ 2/9 Scores  PHQ - 2 Score 0 0 0 0 1 0 2  PHQ- 9 Score  0 0 0 2 2 3     Fall Risk    06/14/2023    9:08 AM 11/01/2022   10:17 AM 06/12/2022    1:14 PM 01/31/2022    3:03 PM 06/08/2021    1:25 PM  Fall Risk   Falls in the past year? 0 0 0 0 0  Number falls in past yr: 0  0  0  Injury with Fall? 0  0  0  Risk for fall due to : No Fall Risks  No Fall Risks  No Fall Risks  Follow up Falls prevention discussed;Education provided;Falls evaluation completed  Falls prevention discussed  Falls prevention discussed    MEDICARE RISK AT HOME: Medicare Risk at Home Any stairs in or around the home?: No If so, are there any without handrails?: No Home free of loose throw rugs in walkways, pet beds, electrical cords, etc?: Yes Adequate lighting in your home to reduce risk of falls?: Yes Life alert?: No Use of a cane, walker or w/c?: No Grab bars in the bathroom?: Yes  Shower chair or bench in shower?: No Elevated toilet seat or a handicapped toilet?: Yes  TIMED UP AND GO:  Was the test performed?  No    Cognitive Function:        06/14/2023    9:08 AM 06/12/2022    1:17 PM 06/08/2021    1:27 PM  6CIT Screen  What Year? 0 points 0 points 0 points  What  month? 0 points 0 points 0 points  What time? 0 points 0 points 0 points  Count back from 20 0 points 0 points 0 points  Months in reverse 0 points 0 points 4 points  Repeat phrase 0 points 0 points 2 points  Total Score 0 points 0 points 6 points    Immunizations Immunization History  Administered Date(s) Administered   Influenza, Quadrivalent, Recombinant, Inj, Pf 04/07/2015   Influenza, Seasonal, Injecte, Preservative Fre 02/08/2023   Influenza,inj,Quad PF,6+ Mos 03/01/2016, 04/17/2017, 02/20/2019   Influenza,trivalent, recombinat, inj, PF 03/11/2014   Influenza-Unspecified 03/11/2014, 04/07/2015, 05/05/2020, 04/12/2021, 04/12/2021   Moderna Covid-19 Vaccine Bivalent Booster 22yrs & up 04/12/2021   Moderna Sars-Covid-2 Vaccination 09/08/2019, 10/06/2019, 04/27/2020   Tdap 03/25/2019   Zoster Recombinant(Shingrix) 07/29/2021, 01/31/2022    TDAP status: Up to date  Flu Vaccine status: Up to date  Pneumococcal vaccine status: Due, Education has been provided regarding the importance of this vaccine. Advised may receive this vaccine at local pharmacy or Health Dept. Aware to provide a copy of the vaccination record if obtained from local pharmacy or Health Dept. Verbalized acceptance and understanding.  Covid-19 vaccine status: Information provided on how to obtain vaccines.   Qualifies for Shingles Vaccine? Yes   Zostavax completed No   Shingrix Completed?: Yes  Screening Tests Health Maintenance  Topic Date Due   Pneumococcal Vaccine 58-80 Years old (1 of 2 - PCV) Never done   Lung Cancer Screening  02/08/2024 (Originally 12/05/2018)   Medicare Annual Wellness (AWV)  06/13/2024   Colonoscopy  01/18/2028   DTaP/Tdap/Td (2 - Td or Tdap) 03/24/2029   INFLUENZA VACCINE  Completed   Hepatitis C Screening  Completed   HIV Screening  Completed   Zoster Vaccines- Shingrix  Completed   HPV VACCINES  Aged Out   COVID-19 Vaccine  Discontinued    Health Maintenance  Health  Maintenance Due  Topic Date Due   Pneumococcal Vaccine 108-49 Years old (1 of 2 - PCV) Never done    Colorectal cancer screening: Type of screening: Colonoscopy. Completed 01/18/23. Repeat every 5 years  Lung Cancer Screening: (Low Dose CT Chest recommended if Age 59-80 years, 20 pack-year currently smoking OR have quit w/in 15years.) does not qualify.   Lung Cancer Screening Referral: n/a  Additional Screening:  Hepatitis C Screening: does qualify; Completed 11/01/22  Vision Screening: Recommended annual ophthalmology exams for early detection of glaucoma and other disorders of the eye. Is the patient up to date with their annual eye exam?  No  Who is the provider or what is the name of the office in which the patient attends annual eye exams? none If pt is not established with a provider, would they like to be referred to a provider to establish care? No .   Dental Screening: Recommended annual dental exams for proper oral hygiene  Community Resource Referral / Chronic Care Management: CRR required this visit?  No   CCM required this visit?  No     Plan:     I have personally reviewed and noted the  following in the patient's chart:   Medical and social history Use of alcohol, tobacco or illicit drugs  Current medications and supplements including opioid prescriptions. Patient is not currently taking opioid prescriptions. Functional ability and status Nutritional status Physical activity Advanced directives List of other physicians Hospitalizations, surgeries, and ER visits in previous 12 months Vitals Screenings to include cognitive, depression, and falls Referrals and appointments  In addition, I have reviewed and discussed with patient certain preventive protocols, quality metrics, and best practice recommendations. A written personalized care plan for preventive services as well as general preventive health recommendations were provided to patient.     Kandis Fantasia [place], [place]   [id number]   After Visit Summary: (Mail) Due to this being a telephonic visit, the after visit summary with patients personalized plan was offered to patient via mail   Nurse Notes: No concerns at this time

## 2023-06-29 ENCOUNTER — Other Ambulatory Visit: Payer: Self-pay | Admitting: Family Medicine

## 2023-06-29 DIAGNOSIS — R1030 Lower abdominal pain, unspecified: Secondary | ICD-10-CM

## 2023-07-31 ENCOUNTER — Other Ambulatory Visit: Payer: Self-pay | Admitting: Family Medicine

## 2023-07-31 DIAGNOSIS — I739 Peripheral vascular disease, unspecified: Secondary | ICD-10-CM

## 2023-08-09 ENCOUNTER — Ambulatory Visit: Payer: 59 | Admitting: Family Medicine

## 2023-08-09 ENCOUNTER — Encounter: Payer: Self-pay | Admitting: Family Medicine

## 2023-08-09 VITALS — BP 137/75 | HR 68 | Temp 97.3°F | Ht 65.0 in | Wt 159.8 lb

## 2023-08-09 DIAGNOSIS — G8929 Other chronic pain: Secondary | ICD-10-CM | POA: Diagnosis not present

## 2023-08-09 DIAGNOSIS — E782 Mixed hyperlipidemia: Secondary | ICD-10-CM | POA: Diagnosis not present

## 2023-08-09 DIAGNOSIS — M542 Cervicalgia: Secondary | ICD-10-CM

## 2023-08-09 DIAGNOSIS — Z0000 Encounter for general adult medical examination without abnormal findings: Secondary | ICD-10-CM | POA: Diagnosis not present

## 2023-08-09 DIAGNOSIS — F411 Generalized anxiety disorder: Secondary | ICD-10-CM

## 2023-08-09 DIAGNOSIS — R1030 Lower abdominal pain, unspecified: Secondary | ICD-10-CM

## 2023-08-09 DIAGNOSIS — F1011 Alcohol abuse, in remission: Secondary | ICD-10-CM

## 2023-08-09 DIAGNOSIS — F17200 Nicotine dependence, unspecified, uncomplicated: Secondary | ICD-10-CM | POA: Diagnosis not present

## 2023-08-09 DIAGNOSIS — K219 Gastro-esophageal reflux disease without esophagitis: Secondary | ICD-10-CM | POA: Diagnosis not present

## 2023-08-09 DIAGNOSIS — Z125 Encounter for screening for malignant neoplasm of prostate: Secondary | ICD-10-CM

## 2023-08-09 DIAGNOSIS — Z23 Encounter for immunization: Secondary | ICD-10-CM

## 2023-08-09 DIAGNOSIS — Z0001 Encounter for general adult medical examination with abnormal findings: Secondary | ICD-10-CM | POA: Diagnosis not present

## 2023-08-09 DIAGNOSIS — F331 Major depressive disorder, recurrent, moderate: Secondary | ICD-10-CM

## 2023-08-09 MED ORDER — ATORVASTATIN CALCIUM 80 MG PO TABS: 80.0000 mg | ORAL_TABLET | Freq: Every day | ORAL | 1 refills | Status: DC

## 2023-08-09 MED ORDER — DESVENLAFAXINE SUCCINATE ER 25 MG PO TB24: 25.0000 mg | ORAL_TABLET | Freq: Every day | ORAL | 1 refills | Status: DC

## 2023-08-09 MED ORDER — MELOXICAM 7.5 MG PO TABS: 7.5000 mg | ORAL_TABLET | Freq: Every day | ORAL | 1 refills | Status: DC

## 2023-08-09 MED ORDER — FAMOTIDINE 20 MG PO TABS: 20.0000 mg | ORAL_TABLET | Freq: Two times a day (BID) | ORAL | 1 refills | Status: DC

## 2023-08-09 MED ORDER — PANTOPRAZOLE SODIUM 40 MG PO TBEC: 40.0000 mg | DELAYED_RELEASE_TABLET | Freq: Every day | ORAL | 1 refills | Status: DC

## 2023-08-09 NOTE — Progress Notes (Signed)
 Complete physical exam  Patient: Cameron Ware   DOB: March 23, 1961   63 y.o. Male  MRN: [id number]  Subjective:    Chief Complaint  Patient presents with   Annual Exam    Cameron Ware is a 63 y.o. male who presents today for a complete physical exam. He reports consuming a general diet. The patient does not participate in regular exercise at present. He generally feels fairly well. He reports sleeping fairly well. He does not have additional problems to discuss today.    History of Present Illness   The patient presents for an annual physical exam.  He experiences stomach aches that frequently wake him up at night. He is currently taking Pepcid and pantoprazole for these issues. He is scheduled to follow up with his gastroenterologist next month.  He is under the care of a vascular surgeon, with an appointment scheduled for next Thursday. He experiences leg pain when standing for long periods but reports no worsening of symptoms. He is taking meloxicam for arthritic pain. He is also on ASA, Plavix, and Statin therapy.   He has a history of smoking, currently smoking a little more than three cigarettes a day. He reports not having consumed alcohol for a couple of months.  He is also on Pristiq for depression and reports that it is 'doing good'.  No changes in weight, hair, skin, or nails. Normal bowel movements and urinates once at night. No pressure in the rectum, swelling in the testicles, or changes in urinary stream. No changes in vision or hearing and does not regularly visit the eye doctor or dentist. Denies any current dental issues.       Most recent fall risk assessment:    06/14/2023    9:08 AM  Fall Risk   Falls in the past year? 0  Number falls in past yr: 0  Injury with Fall? 0  Risk for fall due to : No Fall Risks  Follow up Falls prevention discussed;Education provided;Falls evaluation completed     Most recent depression screenings:     08/09/2023   10:26 AM 06/14/2023    9:06 AM 11/01/2022   10:17 AM 06/12/2022    1:15 PM 05/02/2022   10:45 AM  Depression screen PHQ 2/9  Decreased Interest 3 0 0 0 0  Down, Depressed, Hopeless 3 0 0 0 0  PHQ - 2 Score 6 0 0 0 0  Altered sleeping 2  0 0 0  Tired, decreased energy 0  0 0 0  Change in appetite 0  0 0 0  Feeling bad or failure about yourself  0  0 0 0  Trouble concentrating 0  0 0 0  Moving slowly or fidgety/restless 0  0 0 0  Suicidal thoughts 0  0 0 0  PHQ-9 Score 8  0 0 0  Difficult doing work/chores Not difficult at all  Not difficult at all        08/09/2023   10:27 AM 05/02/2022   10:46 AM 01/31/2022    3:02 PM 07/29/2021    3:31 PM  GAD 7 : Generalized Anxiety Score  Nervous, Anxious, on Edge 0 0 1 3  Control/stop worrying 0 0 1 1  Worry too much - different things 3 0 1 1  Trouble relaxing 3 0 0 0  Restless 3 0 0 0  Easily annoyed or irritable 3 0 0 1  Afraid - awful might happen 0 0 0 1  Total  GAD 7 Score 12 0 3 7  Anxiety Difficulty Not difficult at all  Not difficult at all Not difficult at all      Vision:Not within last year  and Dental: No current dental problems  Patient Active Problem List   Diagnosis Date Noted   Stenosis of celiac artery (HCC) 01/03/2023   Family history of colon cancer in father 01/03/2023   Neck pain, chronic 11/01/2022   Alcohol abuse 05/02/2022   Moderate episode of recurrent major depressive disorder (HCC) 08/01/2021   Generalized anxiety disorder 10/05/2019   Hyperlipidemia 01/30/2017   Abdominal pain 01/30/2017   Gastroesophageal reflux disease 04/06/2016   Peripheral vascular disease, unspecified (HCC) 04/23/2012   Occlusion and stenosis of carotid artery without mention of cerebral infarction 08/14/2011   Atherosclerosis of native artery of extremity with intermittent claudication (HCC) 08/14/2011   Past Medical History:  Diagnosis Date   Anorexia 11/29/2010   Arthritis    Decreased vision    Depression     Dyspnea on exertion    Generalized headaches    High cholesterol 01/30/2017   Hypertension    Peripheral vascular disease (HCC)    Stroke [facility])    Past Surgical History:  Procedure Laterality Date   ABDOMINAL AORTAGRAM N/A 08/22/2011   Procedure: ABDOMINAL Ronny Flurry;  Surgeon: Nada Libman, MD;  Location: [facility] CATH LAB;  Service: Cardiovascular;  Laterality: N/A;   aortogram     COLONOSCOPY N/A 06/07/2017   Procedure: COLONOSCOPY;  Surgeon: Malissa Hippo, MD;  Location: [facility] ENDO SUITE;  Service: Endoscopy;  Laterality: N/A;  150   COLONOSCOPY WITH PROPOFOL N/A 01/18/2023   Procedure: COLONOSCOPY WITH PROPOFOL;  Surgeon: Franky Macho, MD;  Location: [facility] END[facility] SUITE;  Service: Endoscopy;  Laterality: N/A;  1:00pm;asa 3   IR RADIOLOGIST EVAL & MGMT  02/02/2023   PERCUTANEOUS STENT INTERVENTION Bilateral 08/22/2011   Procedure: PERCUTANEOUS STENT INTERVENTION;  Surgeon: Nada Libman, MD;  Location: [facility] CATH LAB;  Service: Cardiovascular;  Laterality: Bilateral;   POLYPECTOMY  01/18/2023   Procedure: POLYPECTOMY;  Surgeon: Franky Macho, MD;  Location: [facility] END[facility];  Service: Endoscopy;;   Stents  August 22, 2011   Bilateral iliac PTA and stenting   Social History   Tobacco Use   Smoking status: Some Days    Current packs/day: 0.00    Average packs/day: 1 pack/day for 40.0 years (40.0 ttl pk-yrs)    Types: Cigarettes    Start date: 08/04/1972    Last attempt to quit: 08/04/2012    Years since quitting: 11.0   Smokeless tobacco: Never   Tobacco comments:    quit smoking 5 yrs ago. smoked one cigarette yesterday (09/25/2018)  Vaping Use   Vaping status: Former  Substance Use Topics   Alcohol use: Yes    Comment: wine every other day; 2-3 a day on 02/20/2019   Drug use: Yes    Frequency: 3.0 times per week    Types: Marijuana   Social History   Socioeconomic History   Marital status: Single    Spouse name: Not on file   Number of children: Not on file   Years of education:  Not on file   Highest education level: Not on file  Occupational History   Not on file  Tobacco Use   Smoking status: Some Days    Current packs/day: 0.00    Average packs/day: 1 pack/day for 40.0 years (40.0 ttl pk-yrs)    Types: Cigarettes  Start date: 08/04/1972    Last attempt to quit: 08/04/2012    Years since quitting: 11.0   Smokeless tobacco: Never   Tobacco comments:    quit smoking 5 yrs ago. smoked one cigarette yesterday (09/25/2018)  Vaping Use   Vaping status: Former  Substance and Sexual Activity   Alcohol use: Yes    Comment: wine every other day; 2-3 a day on 02/20/2019   Drug use: Yes    Frequency: 3.0 times per week    Types: Marijuana   Sexual activity: Not on file  Other Topics Concern   Not on file  Social History Narrative   Lives with mother, Doristine Counter, who is currently under Hospice care-06/08/2021.   Brother and sister in law live nearby and helps with pt's mom.   Social Drivers of Corporate investment banker Strain: Low Risk  (06/14/2023)   Overall Financial Resource Strain (CARDIA)    Difficulty of Paying Living Expenses: Not hard at all  Food Insecurity: No Food Insecurity (06/14/2023)   Hunger Vital Sign    Worried About Running Out of Food in the Last Year: Never true    Ran Out of Food in the Last Year: Never true  Transportation Needs: No Transportation Needs (06/14/2023)   PRAPARE - Administrator, Civil Service (Medical): No    Lack of Transportation (Non-Medical): No  Physical Activity: Inactive (06/14/2023)   Exercise Vital Sign    Days of Exercise per Week: 0 days    Minutes of Exercise per Session: 0 min  Stress: No Stress Concern Present (06/14/2023)   [employer] of Occupational Health - Occupational Stress Questionnaire    Feeling of Stress : Only a little  Social Connections: Moderately Isolated (06/14/2023)   Social Connection and Isolation Panel [NHANES]    Frequency of Communication with Friends and Family: More than  three times a week    Frequency of Social Gatherings with Friends and Family: Three times a week    Attends Religious Services: 1 to 4 times per year    Active Member of Clubs or Organizations: No    Attends Banker Meetings: Never    Marital Status: Never married  Intimate Partner Violence: Not At Risk (06/14/2023)   Humiliation, Afraid, Rape, and Kick questionnaire    Fear of Current or Ex-Partner: No    Emotionally Abused: No    Physically Abused: No    Sexually Abused: No   Family Status  Relation Name Status   Mother  Alive   Father  Alive   Brother  Deceased   Brother  Alive   Brother  Alive   Brother  Alive   Brother  Alive  No partnership data on file   Family History  Problem Relation Age of Onset   Heart disease Mother    Diabetes Father    Colon cancer Father    No Known Allergies    Patient Care Team: Ferrah Panagopoulos, Doralee Albino, FNP as PCP - General ([facility]) [facility] Jodie Echevaria, Osie Cheeks, MD as Referring Physician (Cardiology)   Outpatient Medications Prior to Visit  Medication Sig   aspirin EC 81 MG tablet Take 81 mg by mouth daily. Swallow whole.   clopidogrel (PLAVIX) 75 MG tablet TAKE ONE TABLET DAILY   dicyclomine (BENTYL) 10 MG capsule TAKE ONE CAPSULE THREE TIMES DAILY BEFORE MEALS   potassium chloride (KLOR-CON) 10 MEQ tablet TAKE TWO TABLETS TWICE DAILY   sildenafil (VIAGRA) 50 MG  tablet Take 1 tablet (50 mg total) by mouth daily as needed for erectile dysfunction.   [DISCONTINUED] atorvastatin (LIPITOR) 80 MG tablet Take 1 tablet (80 mg total) by mouth daily.   [DISCONTINUED] desvenlafaxine (PRISTIQ) 25 MG 24 hr tablet TAKE ONE TABLET DAILY   [DISCONTINUED] famotidine (PEPCID) 20 MG tablet Take 1 tablet (20 mg total) by mouth 2 (two) times daily.   [DISCONTINUED] meloxicam (MOBIC) 7.5 MG tablet TAKE ONE TABLET DAILY   [DISCONTINUED] pantoprazole (PROTONIX) 40 MG tablet TAKE ONE TABLET DAILY   No facility-administered  medications prior to visit.    ROS per HPI      Objective:     BP 137/75   Pulse 68   Temp (!) 97.3 F (36.3 C)   Ht 5\' 5"  (1.651 m)   Wt 159 lb 12.8 oz (72.5 kg)   SpO2 97%   BMI 26.59 kg/m  BP Readings from Last 3 Encounters:  08/09/23 137/75  04/10/23 (!) 149/78  03/28/23 (!) 143/70   Wt Readings from Last 3 Encounters:  08/09/23 159 lb 12.8 oz (72.5 kg)  06/14/23 153 lb (69.4 kg)  04/10/23 153 lb 4.8 oz (69.5 kg)   SpO2 Readings from Last 3 Encounters:  08/09/23 97%  03/28/23 95%  02/08/23 96%      Physical Exam Vitals and nursing note reviewed.  Constitutional:      General: He is not in acute distress.    Appearance: Normal appearance. He is overweight. He is not ill-appearing, toxic-appearing or diaphoretic.  HENT:     Head: Normocephalic and atraumatic.     Jaw: There is normal jaw occlusion.     Right Ear: Tympanic membrane, ear canal and external ear normal.     Left Ear: Tympanic membrane, ear canal and external ear normal.     Nose: Nose normal.     Mouth/Throat:     Lips: Pink.     Mouth: Mucous membranes are moist.     Pharynx: Oropharynx is clear.  Eyes:     General: Lids are normal.     Extraocular Movements: Extraocular movements intact.     Conjunctiva/sclera: Conjunctivae normal.     Pupils: Pupils are equal, round, and reactive to light.  Neck:     Vascular: No carotid bruit.     Trachea: Trachea and phonation normal.  Cardiovascular:     Rate and Rhythm: Normal rate and regular rhythm.     Pulses:          Femoral pulses are 1+ on the right side and 1+ on the left side.    Heart sounds: Normal heart sounds. No murmur heard.    No friction rub. No gallop.     Comments: Unable to palpate distal leg pulses, no discoloration of signs of ischemia Pulmonary:     Effort: Pulmonary effort is normal.     Breath sounds: Normal breath sounds.  Chest:  Breasts:    Breasts are symmetrical.  Abdominal:     General: Abdomen is protuberant.  Bowel sounds are normal.     Palpations: Abdomen is soft. There is no hepatomegaly, splenomegaly or pulsatile mass.     Tenderness: There is no abdominal tenderness.  Musculoskeletal:     Cervical back: Normal range of motion and neck supple.     Right lower leg: No edema.     Left lower leg: No edema.  Lymphadenopathy:     Cervical: No cervical adenopathy.  Skin:    General: Skin  is warm and dry.     Capillary Refill: Capillary refill takes less than 2 seconds.  Neurological:     General: No focal deficit present.     Mental Status: He is alert and oriented to person, place, and time.  Psychiatric:        Mood and Affect: Mood normal.        Behavior: Behavior normal. Behavior is cooperative.        Thought Content: Thought content normal.        Judgment: Judgment normal.       Last CBC Lab Results  Component Value Date   WBC 9.6 02/08/2023   HGB 15.7 02/08/2023   HCT 47.9 02/08/2023   MCV 95 02/08/2023   MCH 31.2 02/08/2023   RDW 12.4 02/08/2023   PLT 318 02/08/2023   Last metabolic panel Lab Results  Component Value Date   GLUCOSE 97 02/08/2023   NA 143 02/08/2023   K 4.5 02/08/2023   CL 102 02/08/2023   CO2 25 02/08/2023   BUN 26 02/08/2023   CREATININE 0.84 02/08/2023   EGFR 98 02/08/2023   CALCIUM 10.3 (H) 02/08/2023   PROT 7.1 02/08/2023   ALBUMIN 4.6 02/08/2023   LABGLOB 2.5 02/08/2023   AGRATIO 1.6 11/01/2022   BILITOT 0.4 02/08/2023   ALKPHOS 124 (H) 02/08/2023   ALKPHOS 126 (H) 02/08/2023   AST 20 02/08/2023   ALT 17 02/08/2023   ANIONGAP 12 09/25/2018   Last lipids Lab Results  Component Value Date   CHOL 176 02/08/2023   HDL 63 02/08/2023   LDLCALC 93 02/08/2023   TRIG 111 02/08/2023   CHOLHDL 2.8 02/08/2023   Last hemoglobin A1c No results found for: "HGBA1C" Last thyroid functions Lab Results  Component Value Date   TSH 3.210 02/08/2023   T4TOTAL 6.8 02/08/2023   Last vitamin D Lab Results  Component Value Date   VD25OH  40.8 10/13/2020   Last vitamin B12 and Folate Lab Results  Component Value Date   VITAMINB12 568 02/08/2023   FOLATE >20.0 02/08/2023        Assessment & Plan:    Routine Health Maintenance and Physical Exam  Immunization History  Administered Date(s) Administered   Influenza, Quadrivalent, Recombinant, Inj, Pf 04/07/2015   Influenza, Seasonal, Injecte, Preservative Fre 02/08/2023   Influenza,inj,Quad PF,6+ Mos 03/01/2016, 04/17/2017, 02/20/2019   Influenza,trivalent, recombinat, inj, PF 03/11/2014   Influenza-Unspecified 03/11/2014, 04/07/2015, 05/05/2020, 04/12/2021, 04/12/2021   Moderna Covid-19 Vaccine Bivalent Booster 60yrs & up 04/12/2021   Moderna Sars-Covid-2 Vaccination 09/08/2019, 10/06/2019, 04/27/2020   PNEUMOCOCCAL CONJUGATE-20 08/09/2023   Tdap 03/25/2019   Zoster Recombinant(Shingrix) 07/29/2021, 01/31/2022    Health Maintenance  Topic Date Due   Lung Cancer Screening  02/08/2024 (Originally 12/05/2018)   Medicare Annual Wellness (AWV)  06/13/2024   Colonoscopy  01/18/2028   DTaP/Tdap/Td (2 - Td or Tdap) 03/24/2029   Pneumococcal Vaccine 33-10 Years old  Completed   INFLUENZA VACCINE  Completed   Hepatitis C Screening  Completed   HIV Screening  Completed   Zoster Vaccines- Shingrix  Completed   HPV VACCINES  Aged Out   COVID-19 Vaccine  Discontinued    Discussed health benefits of physical activity, and encouraged him to engage in regular exercise appropriate for his age and condition.  Problem List Items Addressed This Visit       Digestive   Gastroesophageal reflux disease   Relevant Medications   famotidine (PEPCID) 20 MG tablet   pantoprazole (PROTONIX) 40  MG tablet   Other Relevant Orders   CBC with Differential/Platelet     Other   Hyperlipidemia   Relevant Medications   atorvastatin (LIPITOR) 80 MG tablet   Other Relevant Orders   Lipid panel   CMP14+EGFR   Abdominal pain   Relevant Medications   famotidine (PEPCID) 20 MG tablet    pantoprazole (PROTONIX) 40 MG tablet   Other Relevant Orders   CBC with Differential/Platelet   CMP14+EGFR   Generalized anxiety disorder   Relevant Medications   desvenlafaxine (PRISTIQ) 25 MG 24 hr tablet   Other Relevant Orders   Thyroid Panel With TSH   Moderate episode of recurrent major depressive disorder (HCC)   Relevant Medications   desvenlafaxine (PRISTIQ) 25 MG 24 hr tablet   Other Relevant Orders   Thyroid Panel With TSH   CMP14+EGFR   Neck pain, chronic   Relevant Medications   desvenlafaxine (PRISTIQ) 25 MG 24 hr tablet   meloxicam (MOBIC) 7.5 MG tablet   Other Relevant Orders   CMP14+EGFR   VITAMIN D 25 Hydroxy (Vit-D Deficiency, Fractures)   Other Visit Diagnoses       Annual physical exam    -  Primary   Relevant Orders   Lipid panel   PSA, total and free   Thyroid Panel With TSH   CBC with Differential/Platelet   CMP14+EGFR   Ambulatory Referral Lung Cancer Screening [facility]   Pneumococcal conjugate vaccine 20-valent (Prevnar 20) (Completed)     Screening for prostate cancer       Relevant Orders   PSA, total and free     Encounter for screening for malignant neoplasm of lung in current smoker with 30 pack year history or greater       Relevant Orders   Ambulatory Referral Lung Cancer Screening [facility]     Need for vaccination       Relevant Orders   Pneumococcal conjugate vaccine 20-valent (Prevnar 20) (Completed)     History of alcohol abuse       Relevant Orders   CBC with Differential/Platelet   CMP14+EGFR   VITAMIN D 25 Hydroxy (Vit-D Deficiency, Fractures)   Vitamin B12   Folate     Assessment and Plan    Abdominal Pain Chronic abdominal pain with unclear etiology. Scheduled for gastroenterology evaluation next month. - Confirm gastroenterology follow-up appointment.  Head Trauma Recent head trauma from a mirror falling on the head. No ongoing symptoms or complications reported.  Vascular Disease Under the care  of a vascular surgeon with a follow-up appointment next week. No new symptoms reported.  Arthritis Chronic arthritic pain managed with meloxicam. No changes in symptoms reported.  Depression Depression managed with Pristiq. Condition well-managed with current treatment.  General Health Maintenance Routine health maintenance is up to date except for lung cancer screening and pneumonia vaccination. Smoking increases his risk for lung cancer. Discussed benefits of early detection through screening. - Administer pneumonia vaccine. - Order lung cancer screening. - Check PSA, cholesterol, electrolytes, liver function, kidney function, blood count, thyroid function, vitamin D, and vitamin B12 levels. - Encourage smoking cessation.  Follow-up Follow-up appointments scheduled with cardiology and vascular surgery. Gastroenterology follow-up needs confirmation. - Confirm gastroenterology follow-up appointment. - Schedule follow-up appointments as needed based on lab results and screenings.       Return in about 1 year (around 08/08/2024), or if symptoms worsen or fail to improve, for Annual Physical.     Kari Baars, FNP

## 2023-08-10 LAB — CBC WITH DIFFERENTIAL/PLATELET
Basophils Absolute: 0.1 10*3/uL (ref 0.0–0.2)
Basos: 1 %
EOS (ABSOLUTE): 0.3 10*3/uL (ref 0.0–0.4)
Eos: 3 %
Hematocrit: 44.9 % (ref 37.5–51.0)
Hemoglobin: 15 g/dL (ref 13.0–17.7)
Immature Grans (Abs): 0 10*3/uL (ref 0.0–0.1)
Immature Granulocytes: 1 %
Lymphocytes Absolute: 1.8 10*3/uL (ref 0.7–3.1)
Lymphs: 20 %
MCH: 30.4 pg (ref 26.6–33.0)
MCHC: 33.4 g/dL (ref 31.5–35.7)
MCV: 91 fL (ref 79–97)
Monocytes Absolute: 0.9 10*3/uL (ref 0.1–0.9)
Monocytes: 10 %
Neutrophils Absolute: 5.9 10*3/uL (ref 1.4–7.0)
Neutrophils: 65 %
Platelets: 306 10*3/uL (ref 150–450)
RBC: 4.93 x10E6/uL (ref 4.14–5.80)
RDW: 12.6 % (ref 11.6–15.4)
WBC: 8.9 10*3/uL (ref 3.4–10.8)

## 2023-08-10 LAB — LIPID PANEL
Chol/HDL Ratio: 2.9 ratio (ref 0.0–5.0)
Cholesterol, Total: 146 mg/dL (ref 100–199)
HDL: 50 mg/dL (ref >39)
LDL Chol Calc (NIH): 73 mg/dL (ref 0–99)
Triglycerides: 130 mg/dL (ref 0–149)
VLDL Cholesterol Cal: 23 mg/dL (ref 5–40)

## 2023-08-10 LAB — CMP14+EGFR
ALT: 25 IU/L (ref 0–44)
AST: 22 IU/L (ref 0–40)
Albumin: 4.4 g/dL (ref 3.9–4.9)
Alkaline Phosphatase: 116 IU/L (ref 44–121)
BUN/Creatinine Ratio: 21 (ref 10–24)
BUN: 20 mg/dL (ref 8–27)
Bilirubin Total: 0.6 mg/dL (ref 0.0–1.2)
CO2: 22 mmol/L (ref 20–29)
Calcium: 10 mg/dL (ref 8.6–10.2)
Chloride: 105 mmol/L (ref 96–106)
Creatinine, Ser: 0.94 mg/dL (ref 0.76–1.27)
Globulin, Total: 2.6 g/dL (ref 1.5–4.5)
Glucose: 107 mg/dL — ABNORMAL HIGH (ref 70–99)
Potassium: 4.2 mmol/L (ref 3.5–5.2)
Sodium: 141 mmol/L (ref 134–144)
Total Protein: 7 g/dL (ref 6.0–8.5)
eGFR: 91 mL/min/{1.73_m2} (ref >59)

## 2023-08-10 LAB — PSA, TOTAL AND FREE
PSA, Free Pct: 24.6 %
PSA, Free: 0.32 ng/mL
Prostate Specific Ag, Serum: 1.3 ng/mL (ref 0.0–4.0)

## 2023-08-10 LAB — VITAMIN D 25 HYDROXY (VIT D DEFICIENCY, FRACTURES): Vit D, 25-Hydroxy: 43.5 ng/mL (ref 30.0–100.0)

## 2023-08-10 LAB — THYROID PANEL WITH TSH
Free Thyroxine Index: 1.7 (ref 1.2–4.9)
T3 Uptake Ratio: 26 % (ref 24–39)
T4, Total: 6.7 ug/dL (ref 4.5–12.0)
TSH: 2.61 u[IU]/mL (ref 0.450–4.500)

## 2023-08-10 LAB — FOLATE: Folate: 18.7 ng/mL (ref >3.0)

## 2023-08-10 LAB — VITAMIN B12: Vitamin B-12: 599 pg/mL (ref 232–1245)

## 2023-09-03 ENCOUNTER — Other Ambulatory Visit: Payer: Self-pay | Admitting: Family Medicine

## 2023-09-03 DIAGNOSIS — R1030 Lower abdominal pain, unspecified: Secondary | ICD-10-CM

## 2023-09-12 ENCOUNTER — Other Ambulatory Visit: Payer: Self-pay | Admitting: Interventional Radiology

## 2023-09-12 DIAGNOSIS — K551 Chronic vascular disorders of intestine: Secondary | ICD-10-CM

## 2023-09-13 ENCOUNTER — Encounter (HOSPITAL_COMMUNITY): Payer: Self-pay

## 2023-09-13 ENCOUNTER — Ambulatory Visit (HOSPITAL_COMMUNITY)
Admission: RE | Admit: 2023-09-13 | Discharge: 2023-09-13 | Disposition: A | Discharge status: Home / Self Care | Source: Ambulatory Visit | Attending: Interventional Radiology | Admitting: Interventional Radiology

## 2023-09-13 DIAGNOSIS — K551 Chronic vascular disorders of intestine: Secondary | ICD-10-CM | POA: Diagnosis not present

## 2023-09-13 DIAGNOSIS — I701 Atherosclerosis of renal artery: Secondary | ICD-10-CM | POA: Diagnosis not present

## 2023-09-13 DIAGNOSIS — N289 Disorder of kidney and ureter, unspecified: Secondary | ICD-10-CM | POA: Diagnosis not present

## 2023-09-13 DIAGNOSIS — K5730 Diverticulosis of large intestine without perforation or abscess without bleeding: Secondary | ICD-10-CM | POA: Diagnosis not present

## 2023-09-13 MED ORDER — IOHEXOL 350 MG/ML SOLN
100.0000 mL | Freq: Once | INTRAVENOUS | Status: AC | PRN
  Administered 2023-09-13: 100 mL via INTRAVENOUS

## 2023-09-25 ENCOUNTER — Other Ambulatory Visit: Payer: Self-pay | Admitting: Family Medicine

## 2023-09-26 ENCOUNTER — Ambulatory Visit (HOSPITAL_COMMUNITY)
Admission: RE | Admit: 2023-09-26 | Discharge: 2023-09-26 | Disposition: A | Discharge status: Home / Self Care | Payer: 59 | Source: Ambulatory Visit | Attending: Vascular Surgery | Admitting: Vascular Surgery

## 2023-09-26 ENCOUNTER — Ambulatory Visit: Payer: 59 | Attending: Vascular Surgery | Admitting: Physician Assistant

## 2023-09-26 VITALS — BP 137/83 | HR 47 | Temp 98.0°F | Wt 155.7 lb

## 2023-09-26 DIAGNOSIS — I739 Peripheral vascular disease, unspecified: Secondary | ICD-10-CM | POA: Diagnosis not present

## 2023-09-26 DIAGNOSIS — K551 Chronic vascular disorders of intestine: Secondary | ICD-10-CM

## 2023-09-26 LAB — VAS US ABI WITH/WO TBI
Left ABI: 0.53
Right ABI: 0.58

## 2023-09-27 ENCOUNTER — Other Ambulatory Visit: Payer: Self-pay

## 2023-09-27 DIAGNOSIS — K551 Chronic vascular disorders of intestine: Secondary | ICD-10-CM

## 2023-10-01 NOTE — Progress Notes (Unsigned)
 Office Note   History of Present Illness   Cameron Ware is a 63 y.o. (1961/02/08) male who presents for follow up of PAD and mesenteric stenosis.  He has a history of bilateral common iliac artery stenting by Dr. Vikki Ware in 2013.  He does have ongoing claudication in both calves, however this is not lifestyle limiting.  He returns today for follow-up.  He says that his claudication remains the same in his calves.  He only gets discomfort at long distances or when going up an incline.  He denies any rest pain or tissue loss.   He has a history of lower abdominal pain, worse on the left.  He is followed by GI.  In the past, it was felt that his abdominal pain was not related to his mesenteric stenosis, given that he did not have any postprandial pain, unintentional weight loss, or food fear.  Previously his abdominal pain would be aggravated by strenuous exercise such as pushing/pulling objects.  At today's visit, he says that his abdominal pain has gotten worse over the past couple of months.  He says that he has 6/10 "tearing" lower abdominal pain at rest.  He says that nothing makes his pain better.  His pain does not specifically get worse after eating, however his pain has significantly reduced his appetite.  He has no unintentional weight loss.  Current Outpatient Medications  Medication Sig Dispense Refill   aspirin EC 81 MG tablet Take 81 mg by mouth daily. Swallow whole.     atorvastatin  (LIPITOR) 80 MG tablet Take 1 tablet (80 mg total) by mouth daily. 90 tablet 1   clopidogrel  (PLAVIX ) 75 MG tablet TAKE ONE TABLET DAILY 90 tablet 0   desvenlafaxine  (PRISTIQ ) 25 MG 24 hr tablet Take 1 tablet (25 mg total) by mouth daily. 90 tablet 1   dicyclomine  (BENTYL ) 10 MG capsule TAKE ONE CAPSULE THREE TIMES DAILY BEFORE MEALS 90 capsule 1   famotidine  (PEPCID ) 20 MG tablet Take 1 tablet (20 mg total) by mouth 2 (two) times daily. 180 tablet 1   meloxicam  (MOBIC ) 7.5 MG tablet Take 1  tablet (7.5 mg total) by mouth daily. 90 tablet 1   pantoprazole  (PROTONIX ) 40 MG tablet Take 1 tablet (40 mg total) by mouth daily. 90 tablet 1   potassium chloride  (KLOR-CON ) 10 MEQ tablet TAKE TWO TABLETS TWICE DAILY 120 tablet 5   sildenafil  (VIAGRA ) 50 MG tablet Take 1 tablet (50 mg total) by mouth daily as needed for erectile dysfunction. 10 tablet 0   No current facility-administered medications for this visit.    REVIEW OF SYSTEMS (negative unless checked):   Cardiac:  []  Chest pain or chest pressure? []  Shortness of breath upon activity? []  Shortness of breath when lying flat? []  Irregular heart rhythm?  Vascular:  [x]  Pain in calf, thigh, or hip brought on by walking? []  Pain in feet at night that wakes you up from your sleep? []  Blood clot in your veins? []  Leg swelling?  Pulmonary:  []  Oxygen at home? []  Productive cough? []  Wheezing?  Neurologic:  []  Sudden weakness in arms or legs? []  Sudden numbness in arms or legs? []  Sudden onset of difficult speaking or slurred speech? []  Temporary loss of vision in one eye? []  Problems with dizziness?  Gastrointestinal:  []  Blood in stool? []  Vomited blood?  Genitourinary:  []  Burning when urinating? []  Blood in urine?  Psychiatric:  []  Major depression  Hematologic:  []  Bleeding problems? []   Problems with blood clotting?  Dermatologic:  []  Rashes or ulcers?  Constitutional:  []  Fever or chills?  Ear/Nose/Throat:  []  Change in hearing? []  Nose bleeds? []  Sore throat?  Musculoskeletal:  []  Back pain? []  Joint pain? []  Muscle pain?   Physical Examination   Vitals:   09/26/23 0948  BP: 137/83  Pulse: (!) 47  Temp: 98 F (36.7 C)  TempSrc: Temporal  SpO2: 96%  Weight: 155 lb 11.2 oz (70.6 kg)   Body mass index is 25.91 kg/m.  General:  WDWN in NAD; vital signs documented above Gait: Not observed HENT: WNL, normocephalic Pulmonary: normal non-labored breathing , without rales, rhonchi,   wheezing Cardiac: Regular Abdomen: soft, nondistended Skin: without rashes Vascular Exam/Pulses: Nonpalpable pedal pulses, monophasic DP/PT Doppler signals bilaterally Extremities: without ischemic changes, without gangrene , without cellulitis; without open wounds;  Musculoskeletal: no muscle wasting or atrophy  Neurologic: A&O X 3;  No focal weakness or paresthesias are detected Psychiatric:  The pt has Normal affect.   Non-Invasive Vascular Imaging   Mesenteric duplex (09/26/2023) Ceilac artery stenosis >70%  Origin and proximal SMA not well visualized. Cannot rule out severe  stenosis vs.  occlusion. Distal flow identified.    Medical Decision Making   Cameron Ware is a 63 y.o. (30-Aug-1960) male who presents for follow-up  The patient has a history of asymptomatic mesenteric stenosis and PAD with stable claudication His ABIs at todays visit are stable bilaterally. His right ABI is 0.58 and left ABI is 0.53 Mesenteric duplex demonstrates significant stenosis within the celiac artery and SMA. 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 Previously his pain was manageable and only seemed to get worse after strenuous activity. He says over the past couple of months his abdominal pain has worsened. He has constant lower abdominal pain without any specific aggravating factors. Nothing makes his pain better. He denies any postprandial pain, however he says that his abdominal pain has significantly reduced his appetite One exam he has nonpalpable pedal pulses. He has palpable femoral pulses. His abdomen is soft and slightly tender across the lower quadrants I have discussed this case with Dr.Cain. He does have severe stenosis of his SMA. Although the patient's abdominal pain is not typical for mesenteric ischemia, there is a chance that  his SMA stenosis is contributing to his pain. His GI workup has been negative. He could benefit from abdominal aortogram with possible mesenteric intervention The patient would like to proceed with angiogram. We will schedule this with Dr.Cain within the next couple of weeks   Deneise Finlay PA-C [facility] Office: [phone]  Clinic MD: Cameron Ware

## 2023-10-01 NOTE — H&P (View-Only) (Signed)
 Office Note   History of Present Illness   Cameron Ware is a 63 y.o. (07-03-1960) male who presents for follow up of PAD and mesenteric stenosis.  He has a history of bilateral common iliac artery stenting by Dr. Vikki Graves in 2013.  He does have ongoing claudication in both calves, however this is not lifestyle limiting.  He returns today for follow-up.  He says that his claudication remains the same in his calves.  He only gets discomfort at long distances or when going up an incline.  He denies any rest pain or tissue loss.   He has a history of lower abdominal pain, worse on the left.  He is followed by GI.  In the past, it was felt that his abdominal pain was not related to his mesenteric stenosis, given that he did not have any postprandial pain, unintentional weight loss, or food fear.  Previously his abdominal pain would be aggravated by strenuous exercise such as pushing/pulling objects.  At today's visit, he says that his abdominal pain has gotten worse over the past couple of months.  He says that he has 6/10 "tearing" lower abdominal pain at rest.  He says that nothing makes his pain better.  His pain does not specifically get worse after eating, however his pain has significantly reduced his appetite.  He has no unintentional weight loss.  Current Outpatient Medications  Medication Sig Dispense Refill   aspirin EC 81 MG tablet Take 81 mg by mouth daily. Swallow whole.     atorvastatin  (LIPITOR) 80 MG tablet Take 1 tablet (80 mg total) by mouth daily. 90 tablet 1   clopidogrel  (PLAVIX ) 75 MG tablet TAKE ONE TABLET DAILY 90 tablet 0   desvenlafaxine  (PRISTIQ ) 25 MG 24 hr tablet Take 1 tablet (25 mg total) by mouth daily. 90 tablet 1   dicyclomine  (BENTYL ) 10 MG capsule TAKE ONE CAPSULE THREE TIMES DAILY BEFORE MEALS 90 capsule 1   famotidine  (PEPCID ) 20 MG tablet Take 1 tablet (20 mg total) by mouth 2 (two) times daily. 180 tablet 1   meloxicam  (MOBIC ) 7.5 MG tablet Take 1  tablet (7.5 mg total) by mouth daily. 90 tablet 1   pantoprazole  (PROTONIX ) 40 MG tablet Take 1 tablet (40 mg total) by mouth daily. 90 tablet 1   potassium chloride  (KLOR-CON ) 10 MEQ tablet TAKE TWO TABLETS TWICE DAILY 120 tablet 5   sildenafil  (VIAGRA ) 50 MG tablet Take 1 tablet (50 mg total) by mouth daily as needed for erectile dysfunction. 10 tablet 0   No current facility-administered medications for this visit.    REVIEW OF SYSTEMS (negative unless checked):   Cardiac:  []  Chest pain or chest pressure? []  Shortness of breath upon activity? []  Shortness of breath when lying flat? []  Irregular heart rhythm?  Vascular:  [x]  Pain in calf, thigh, or hip brought on by walking? []  Pain in feet at night that wakes you up from your sleep? []  Blood clot in your veins? []  Leg swelling?  Pulmonary:  []  Oxygen at home? []  Productive cough? []  Wheezing?  Neurologic:  []  Sudden weakness in arms or legs? []  Sudden numbness in arms or legs? []  Sudden onset of difficult speaking or slurred speech? []  Temporary loss of vision in one eye? []  Problems with dizziness?  Gastrointestinal:  []  Blood in stool? []  Vomited blood?  Genitourinary:  []  Burning when urinating? []  Blood in urine?  Psychiatric:  []  Major depression  Hematologic:  []  Bleeding problems? []   Problems with blood clotting?  Dermatologic:  []  Rashes or ulcers?  Constitutional:  []  Fever or chills?  Ear/Nose/Throat:  []  Change in hearing? []  Nose bleeds? []  Sore throat?  Musculoskeletal:  []  Back pain? []  Joint pain? []  Muscle pain?   Physical Examination   Vitals:   09/26/23 0948  BP: 137/83  Pulse: (!) 47  Temp: 98 F (36.7 C)  TempSrc: Temporal  SpO2: 96%  Weight: 155 lb 11.2 oz (70.6 kg)   Body mass index is 25.91 kg/m.  General:  WDWN in NAD; vital signs documented above Gait: Not observed HENT: WNL, normocephalic Pulmonary: normal non-labored breathing , without rales, rhonchi,   wheezing Cardiac: Regular Abdomen: soft, nondistended Skin: without rashes Vascular Exam/Pulses: Nonpalpable pedal pulses, monophasic DP/PT Doppler signals bilaterally Extremities: without ischemic changes, without gangrene , without cellulitis; without open wounds;  Musculoskeletal: no muscle wasting or atrophy  Neurologic: A&O X 3;  No focal weakness or paresthesias are detected Psychiatric:  The pt has Normal affect.   Non-Invasive Vascular Imaging   Mesenteric duplex (09/26/2023) Ceilac artery stenosis >70%  Origin and proximal SMA not well visualized. Cannot rule out severe  stenosis vs.  occlusion. Distal flow identified.    Medical Decision Making   [name] is a 63 y.o. (June 16, 1960) male who presents for follow-up  The patient has a history of asymptomatic mesenteric stenosis and PAD with stable claudication His ABIs at todays visit are stable bilaterally. His right ABI is 0.58 and left ABI is 0.53 Mesenteric duplex demonstrates significant stenosis within the celiac artery and SMA. 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 Previously his pain was manageable and only seemed to get worse after strenuous activity. He says over the past couple of months his abdominal pain has worsened. He has constant lower abdominal pain without any specific aggravating factors. Nothing makes his pain better. He denies any postprandial pain, however he says that his abdominal pain has significantly reduced his appetite One exam he has nonpalpable pedal pulses. He has palpable femoral pulses. His abdomen is soft and slightly tender across the lower quadrants I have discussed this case with Dr.Cain. He does have severe stenosis of his SMA. Although the patient's abdominal pain is not typical for mesenteric ischemia, there is a chance that  his SMA stenosis is contributing to his pain. His GI workup has been negative. He could benefit from abdominal aortogram with possible mesenteric intervention The patient would like to proceed with angiogram. We will schedule this with Dr.Cain within the next couple of weeks   Deneise Finlay PA-C [facility] Office: [phone]  Clinic MD: Vikki Graves

## 2023-10-08 ENCOUNTER — Ambulatory Visit (HOSPITAL_COMMUNITY)
Admission: RE | Admit: 2023-10-08 | Discharge: 2023-10-08 | Disposition: A | Discharge status: Home / Self Care | Attending: Vascular Surgery | Admitting: Vascular Surgery

## 2023-10-08 ENCOUNTER — Encounter (HOSPITAL_COMMUNITY)
Admission: RE | Disposition: A | Discharge status: Home / Self Care | Payer: Self-pay | Source: Home / Self Care | Attending: Vascular Surgery

## 2023-10-08 ENCOUNTER — Other Ambulatory Visit: Payer: Self-pay

## 2023-10-08 DIAGNOSIS — K551 Chronic vascular disorders of intestine: Secondary | ICD-10-CM | POA: Insufficient documentation

## 2023-10-08 DIAGNOSIS — I739 Peripheral vascular disease, unspecified: Secondary | ICD-10-CM | POA: Insufficient documentation

## 2023-10-08 HISTORY — PX: VISCERAL ARTERY INTERVENTION: CATH118277

## 2023-10-08 HISTORY — PX: VISCERAL ANGIOGRAPHY: CATH118276

## 2023-10-08 LAB — POCT ACTIVATED CLOTTING TIME
Activated Clotting Time: 205 s
Activated Clotting Time: 182 s

## 2023-10-08 LAB — POCT I-STAT, CHEM 8
BUN: 21 mg/dL (ref 8–23)
Calcium, Ion: 1.29 mmol/L (ref 1.15–1.40)
Chloride: 103 mmol/L (ref 98–111)
Creatinine, Ser: 0.9 mg/dL (ref 0.61–1.24)
Glucose, Bld: 98 mg/dL (ref 70–99)
HCT: 41 % (ref 39.0–52.0)
Hemoglobin: 13.9 g/dL (ref 13.0–17.0)
Potassium: 3.7 mmol/L (ref 3.5–5.1)
Sodium: 143 mmol/L (ref 135–145)
TCO2: 28 mmol/L (ref 22–32)

## 2023-10-08 SURGERY — VISCERAL ANGIOGRAPHY
Anesthesia: LOCAL

## 2023-10-08 MED ORDER — ACETAMINOPHEN 325 MG PO TABS: 650.0000 mg | ORAL_TABLET | ORAL | Status: DC | PRN

## 2023-10-08 MED ORDER — LIDOCAINE HCL (PF) 1 % IJ SOLN
INTRAMUSCULAR | Status: AC
Start: 2023-10-08 — End: ?
  Filled 2023-10-08: qty 30

## 2023-10-08 MED ORDER — FENTANYL CITRATE (PF) 100 MCG/2ML IJ SOLN
INTRAMUSCULAR | Status: AC
  Filled 2023-10-08: qty 2

## 2023-10-08 MED ORDER — HYDRALAZINE HCL 20 MG/ML IJ SOLN
INTRAMUSCULAR | Status: DC | PRN
Start: 2023-10-08 — End: 2023-10-09
  Administered 2023-10-08: 10 mg via INTRAVENOUS

## 2023-10-08 MED ORDER — SODIUM CHLORIDE 0.9% FLUSH: 3.0000 mL | Freq: Two times a day (BID) | INTRAVENOUS | Status: DC

## 2023-10-08 MED ORDER — LIDOCAINE HCL (PF) 1 % IJ SOLN
INTRAMUSCULAR | Status: DC | PRN
Start: 2023-10-08 — End: 2023-10-09
  Administered 2023-10-08: 10 mL

## 2023-10-08 MED ORDER — HEPARIN SODIUM (PORCINE) 1000 UNIT/ML IJ SOLN
INTRAMUSCULAR | Status: DC | PRN
  Administered 2023-10-08: 6000 [IU] via INTRAVENOUS

## 2023-10-08 MED ORDER — SODIUM CHLORIDE 0.9 % IV SOLN: INTRAVENOUS | Status: DC

## 2023-10-08 MED ORDER — SODIUM CHLORIDE 0.9 % WEIGHT BASED INFUSION
1.0000 mL/kg/h | INTRAVENOUS | Status: DC
  Administered 2023-10-08: 1 mL/kg/h via INTRAVENOUS

## 2023-10-08 MED ORDER — CLOPIDOGREL BISULFATE 75 MG PO TABS
ORAL_TABLET | ORAL | Status: AC
  Filled 2023-10-08: qty 1

## 2023-10-08 MED ORDER — ATROPINE SULFATE 1 MG/10ML IJ SOSY
PREFILLED_SYRINGE | INTRAMUSCULAR | Status: AC
  Administered 2023-10-08: 0.5 mg
  Filled 2023-10-08: qty 10

## 2023-10-08 MED ORDER — MORPHINE SULFATE (PF) 2 MG/ML IV SOLN: 2.0000 mg | INTRAVENOUS | Status: DC | PRN

## 2023-10-08 MED ORDER — CLOPIDOGREL BISULFATE 300 MG PO TABS
ORAL_TABLET | ORAL | Status: DC | PRN
  Administered 2023-10-08: 75 mg via ORAL

## 2023-10-08 MED ORDER — SODIUM CHLORIDE 0.9% FLUSH: 3.0000 mL | INTRAVENOUS | Status: DC | PRN

## 2023-10-08 MED ORDER — IODIXANOL 320 MG/ML IV SOLN
INTRAVENOUS | Status: DC | PRN
  Administered 2023-10-08: 140 mL

## 2023-10-08 MED ORDER — HYDRALAZINE HCL 20 MG/ML IJ SOLN: 5.0000 mg | INTRAMUSCULAR | Status: DC | PRN

## 2023-10-08 MED ORDER — OXYCODONE HCL 5 MG PO TABS: 5.0000 mg | ORAL_TABLET | ORAL | Status: DC | PRN

## 2023-10-08 MED ORDER — HEPARIN (PORCINE) IN NACL 1000-0.9 UT/500ML-% IV SOLN
INTRAVENOUS | Status: DC | PRN
  Administered 2023-10-08 (×2): 1000 mL via SURGICAL_CAVITY

## 2023-10-08 MED ORDER — MIDAZOLAM HCL 2 MG/2ML IJ SOLN
INTRAMUSCULAR | Status: DC | PRN
  Administered 2023-10-08: 1 mg via INTRAVENOUS

## 2023-10-08 MED ORDER — MIDAZOLAM HCL 2 MG/2ML IJ SOLN
INTRAMUSCULAR | Status: AC
  Filled 2023-10-08: qty 2

## 2023-10-08 MED ORDER — HYDRALAZINE HCL 20 MG/ML IJ SOLN
INTRAMUSCULAR | Status: AC
  Filled 2023-10-08: qty 1

## 2023-10-08 MED ORDER — ATROPINE SULFATE 1 MG/10ML IJ SOSY: 0.5000 mg | PREFILLED_SYRINGE | Freq: Once | INTRAMUSCULAR | Status: DC

## 2023-10-08 MED ORDER — SODIUM CHLORIDE 0.9 % IV SOLN: 250.0000 mL | INTRAVENOUS | Status: DC | PRN

## 2023-10-08 MED ORDER — ONDANSETRON HCL 4 MG/2ML IJ SOLN: 4.0000 mg | Freq: Four times a day (QID) | INTRAMUSCULAR | Status: DC | PRN

## 2023-10-08 MED ORDER — LABETALOL HCL 5 MG/ML IV SOLN: 10.0000 mg | INTRAVENOUS | Status: DC | PRN

## 2023-10-08 MED ORDER — FENTANYL CITRATE (PF) 100 MCG/2ML IJ SOLN
INTRAMUSCULAR | Status: DC | PRN
  Administered 2023-10-08: 50 ug via INTRAVENOUS

## 2023-10-08 MED ORDER — HEPARIN SODIUM (PORCINE) 1000 UNIT/ML IJ SOLN
INTRAMUSCULAR | Status: AC
  Filled 2023-10-08: qty 10

## 2023-10-08 SURGICAL SUPPLY — 20 items
BALLOON STERLING OTW 4X40X135 (BALLOONS) IMPLANT
CATH ANGIO 5F BER2 100CM (CATHETERS) IMPLANT
CATH OMNI FLUSH 5F 65CM (CATHETERS) IMPLANT
DEVICE TORQUE .025-.038 (MISCELLANEOUS) IMPLANT
GUIDEWIRE ANGLED .035X260CM (WIRE) IMPLANT
KIT ENCORE 26 ADVANTAGE (KITS) IMPLANT
KIT MICROPUNCTURE NIT STIFF (SHEATH) IMPLANT
KIT PV (KITS) ×2 IMPLANT
SET ATX-X65L (MISCELLANEOUS) IMPLANT
SHEATH INTROD PASSPORT 45 6.5F (SHEATH) IMPLANT
SHEATH PINNACLE 5F 10CM (SHEATH) IMPLANT
SHEATH PINNACLE 6F 10CM (SHEATH) IMPLANT
SHEATH PINNACLE 7F 10CM (SHEATH) IMPLANT
STENT VIABAHN 7X19 6FR 80 (Permanent Stent) IMPLANT
SYR MEDRAD MARK 7 150ML (SYRINGE) ×2 IMPLANT
TRANSDUCER W/STOPCOCK (MISCELLANEOUS) ×2 IMPLANT
TRAY PV CATH (CUSTOM PROCEDURE TRAY) ×2 IMPLANT
WIRE BENTSON .035X145CM (WIRE) IMPLANT
WIRE G V18X300CM (WIRE) IMPLANT
WIRE STARTER ROSEN .035X260 (WIRE) IMPLANT

## 2023-10-08 NOTE — Progress Notes (Signed)
 Dr. Vikki Graves notified of patient vasovagal response during sheath pull w/HR in mid 40's and patient yawning; no c/o nausea. 0.5mg  Atropine IV given w/increase in HR and BP. Faint bruising at puncture site; no hematoma. VSS

## 2023-10-08 NOTE — Progress Notes (Signed)
 Patient walked to the bathroom without difficulties. Right groin level 0, clean, dry, and intact.

## 2023-10-08 NOTE — Progress Notes (Signed)
 Site area: Right groin  Site Prior to Removal:  Level 1, small bruise above insertion site.  Pressure Applied For:35 minutes Manual:   yes Patient Status During Pull:  pt experienced vaso vagal response, HR came down to mid 40's, yawning, pale, 0.5mg  Atropine given.  Dr. Vikki Graves made aware Post Pull Site:  Level 1, bruising has expanded some. No hematoma Post Pull Instructions Given:  Yes Post Pull Pulses Present: Right DP and PT doppler Dressing Applied:  gauze and tegaderm Bedrest begins @  1630 Comments:

## 2023-10-08 NOTE — Op Note (Signed)
 Patient name: Cameron Ware MRN: [id number] DOB: [birth year] Sex: male  10/08/2023 Pre-operative Diagnosis: Abdominal pain with mesenteric artery stenosis consistent with chronic mesenteric ischemia Post-operative diagnosis:  Same Surgeon:  Sarajane Cumming. Vikki Graves, MD Procedure Performed: 1.  Percutaneous ultrasound-guided cannulation right common femoral artery 2.  Catheter aorta and aortogram 3.  Catheter selection of SMA with selective SMA angiogram 4.  Stent of celiac artery with 7 x 19 mm VBX 5.  Moderate sedation with fentanyl  and Versed  for 66 minutes  Indications: 63 year old male has a history of bilateral common iliac artery stenting with ongoing claudication that is nonlife limiting.  He also has severe abdominal pain which mostly affects his left lower quadrant does not appear to be postprandial he has not had food fear or weight loss but does have known mesenteric artery stenosis and there is concern for chronic mesenteric ischemia.  He is now indicated for angiography with possible invention.  Findings: Bilateral common iliac artery stents are patent.  The right common femoral artery is heavily diseased and a closure device was not placed.  The aorta is heavily calcified.  The SMA is patent without stenosis initially but is diseased throughout its course even down to his major branch point but this is not amenable to percutaneous intervention.  The celiac artery was occluded and I was able to access this and stent this to 0% residual stenosis and there was brisk flow into both the common hepatic and splenic vessels.  The left gastric artery was not identified.  The IMA is atretic at its takeoff although does fill the pelvis.  Patient has improved flow to his gut hopefully this will alleviate some of his symptoms.   Procedure:  The patient was identified in the holding area and taken to room 8.  The patient was then placed supine on the table and prepped and draped in the usual  sterile fashion.  A time out was called.  Ultrasound was used to evaluate the right common femoral artery.  This was heavily diseased.  The area was anesthetized 1% lidocaine  and cannulated with a micropuncture needle followed by wire and sheath.  Concomitantly we administered fentanyl  and Versed  as moderate sedation his vital signs were monitored throughout the case.  We placed a Bentson wire followed by a 5 [place] sheath.  We placed an Omni catheter to low level of T12 performed AP angiography and then lateral angiography.  With the above findings I placed a 6.5 French steerable sheath and the patient was fully heparinized.  I attempted using the Berenstein catheter and Glidewire to cannulate the celiac but ultimately I cannulated the SMA and performed angiography which demonstrated retrograde filling all of the SMA was also disease.  With this I elected the patient would more than likely benefit from celiac artery intervention.  I was ultimately able to cross the occluded celiac artery with a V18 wire and then ballooned this with a 4 mm balloon.  I then primarily stented with a 7 x 19 mm VBX which was directed cephalad at the ostium.  Completion demonstrated brisk flow into both the common hepatic and splenic vessels with distal filling of the dilated arterials.  Satisfied with this we then exchanged for a short 7 [place] sheath perform retrograde angiography which demonstrated subtotal occlusion of the common femoral artery and without any closure device was not placed and the sheath will be pulled in postoperative holding.  Patient did tolerate the procedure without immediate complication.  Contrast: 140 cc  Kassity Woodson C. Vikki Graves, MD [facility] Office: [phone] Pager: [phone]

## 2023-10-08 NOTE — Discharge Instructions (Signed)
 Femoral Site Care This sheet gives you information about how to care for yourself after your procedure. Your health care provider may also give you more specific instructions. If you have problems or questions, contact your health care provider. What can I expect after the procedure?  After the procedure, it is common to have: Bruising that usually fades within 1-2 weeks. Tenderness at the site. Follow these instructions at home: Wound care Follow instructions from your health care provider about how to take care of your insertion site. Make sure you: Wash your hands with soap and water before you change your bandage (dressing). If soap and water are not available, use hand sanitizer. Remove your dressing as told by your health care provider. In 24 hours Do not take baths, swim, or use a hot tub until your health care provider approves. You may shower 24-48 hours after the procedure or as told by your health care provider. Gently wash the site with plain soap and water. Pat the area dry with a clean towel. Do not rub the site. This may cause bleeding. Do not apply powder or lotion to the site. Keep the site clean and dry. Check your femoral site every day for signs of infection. Check for: Redness, swelling, or pain. Fluid or blood. Warmth. Pus or a bad smell. Activity For the first 2-3 days after your procedure, or as long as directed: Avoid climbing stairs as much as possible. Do not squat. Do not lift anything that is heavier than 10 lb (4.5 kg), or the limit that you are told, until your health care provider says that it is safe. For 5 days Rest as directed. Avoid sitting for a long time without moving. Get up to take short walks every 1-2 hours. Do not drive for 24 hours if you were given a medicine to help you relax (sedative). General instructions Take over-the-counter and prescription medicines only as told by your health care provider. Keep all follow-up visits as told by  your health care provider. This is important. Contact a health care provider if you have: A fever or chills. You have redness, swelling, or pain around your insertion site. Get help right away if: The catheter insertion area swells very fast. You pass out. You suddenly start to sweat or your skin gets clammy. The catheter insertion area is bleeding, and the bleeding does not stop when you hold steady pressure on the area. The area near or just beyond the catheter insertion site becomes pale, cool, tingly, or numb. These symptoms may represent a serious problem that is an emergency. Do not wait to see if the symptoms will go away. Get medical help right away. Call your local emergency services (911 in the [place] Do not drive yourself to the hospital. Summary After the procedure, it is common to have bruising that usually fades within 1-2 weeks. Check your femoral site every day for signs of infection. Do not lift anything that is heavier than 10 lb (4.5 kg), or the limit that you are told, until your health care provider says that it is safe. This information is not intended to replace advice given to you by your health care provider. Make sure you discuss any questions you have with your health care provider. Document Revised: 05/28/2017 Document Reviewed: 05/28/2017 Elsevier Patient Education  2020 [employer].

## 2023-10-08 NOTE — Progress Notes (Signed)
 Spoke with Dr. Vikki Graves, ok to remove femoral sheath with ACT at 182, also informed that some sheath removal orders not in computer/missing, ok to enter orders

## 2023-10-08 NOTE — Interval H&P Note (Signed)
 History and Physical Interval Note:  10/08/2023 11:24 AM  Cameron Ware  has presented today for surgery, with the diagnosis of mesenteric artery stenosis.  The various methods of treatment have been discussed with the patient and family. After consideration of risks, benefits and other options for treatment, the patient has consented to  Procedure(s): VISCERAL ANGIOGRAPHY (N/A) UPPER EXTREMITY INTERVENTION (N/A) as a surgical intervention.  The patient's history has been reviewed, patient examined, no change in status, stable for surgery.  I have reviewed the patient's chart and labs.  Questions were answered to the patient's satisfaction.     Angela Kell

## 2023-10-09 ENCOUNTER — Encounter (HOSPITAL_COMMUNITY): Payer: Self-pay | Admitting: Vascular Surgery

## 2023-10-26 ENCOUNTER — Other Ambulatory Visit: Payer: Self-pay | Admitting: Vascular Surgery

## 2023-10-26 DIAGNOSIS — K551 Chronic vascular disorders of intestine: Secondary | ICD-10-CM

## 2023-10-26 DIAGNOSIS — I739 Peripheral vascular disease, unspecified: Secondary | ICD-10-CM

## 2023-10-29 ENCOUNTER — Other Ambulatory Visit: Payer: Self-pay | Admitting: Family Medicine

## 2023-10-29 DIAGNOSIS — R1030 Lower abdominal pain, unspecified: Secondary | ICD-10-CM

## 2023-10-30 ENCOUNTER — Other Ambulatory Visit: Payer: Self-pay | Admitting: Family Medicine

## 2023-10-30 DIAGNOSIS — I739 Peripheral vascular disease, unspecified: Secondary | ICD-10-CM

## 2023-11-09 ENCOUNTER — Ambulatory Visit (INDEPENDENT_AMBULATORY_CARE_PROVIDER_SITE_OTHER): Admitting: Family Medicine

## 2023-11-09 ENCOUNTER — Encounter: Payer: Self-pay | Admitting: Family Medicine

## 2023-11-09 VITALS — BP 122/60 | HR 60 | Temp 97.8°F | Ht 69.0 in | Wt 159.6 lb

## 2023-11-09 DIAGNOSIS — F331 Major depressive disorder, recurrent, moderate: Secondary | ICD-10-CM | POA: Diagnosis not present

## 2023-11-09 DIAGNOSIS — F411 Generalized anxiety disorder: Secondary | ICD-10-CM

## 2023-11-09 DIAGNOSIS — I70213 Atherosclerosis of native arteries of extremities with intermittent claudication, bilateral legs: Secondary | ICD-10-CM | POA: Diagnosis not present

## 2023-11-09 DIAGNOSIS — R1030 Lower abdominal pain, unspecified: Secondary | ICD-10-CM

## 2023-11-09 DIAGNOSIS — L989 Disorder of the skin and subcutaneous tissue, unspecified: Secondary | ICD-10-CM

## 2023-11-09 DIAGNOSIS — E782 Mixed hyperlipidemia: Secondary | ICD-10-CM | POA: Diagnosis not present

## 2023-11-09 DIAGNOSIS — K551 Chronic vascular disorders of intestine: Secondary | ICD-10-CM | POA: Insufficient documentation

## 2023-11-09 MED ORDER — ATORVASTATIN CALCIUM 80 MG PO TABS: 80.0000 mg | ORAL_TABLET | Freq: Every day | ORAL | 1 refills | Status: DC

## 2023-11-09 MED ORDER — DESVENLAFAXINE SUCCINATE ER 25 MG PO TB24: 25.0000 mg | ORAL_TABLET | Freq: Every day | ORAL | 1 refills | Status: DC

## 2023-11-09 MED ORDER — DICYCLOMINE HCL 10 MG PO CAPS: 10.0000 mg | ORAL_CAPSULE | Freq: Three times a day (TID) | ORAL | 1 refills | Status: DC

## 2023-11-09 NOTE — Progress Notes (Signed)
 Subjective:  [name], male    DOB: 26-Sep-1960, 63 y.o.   MRN: [id number]  Patient Care Team: Galvin Jules, FNP as PCP - [facility] ([facility]) [facility], [facility] Gentle Frutoso Jing, MD as Referring Physician (Cardiology)   Chief Complaint:  Medical Management of Chronic Issues   HPI: Cameron Ware is a 63 y.o. male presenting on 11/09/2023 for Medical Management of Chronic Issues   Cameron Ware is a 63 year old male with coronary artery disease who presents for a checkup and medication refills.  He underwent an angiogram and stent placement in the celiac artery approximately one month ago. Since the procedure, he has experienced persistent discomfort. He is scheduled for a follow-up with the vascular surgeon on the 18th. His iliac artery stents remain open, and he has been diagnosed with femoral artery disease and a heavily calcified aorta.  He is currently taking high-dose Lipitor, aspirin , Plavix , and dicyclomine  (Bentyl ) for stomach spasms. He has difficulty obtaining a refill for Bentyl . He is also prescribed venlafaxine (Pristiq ) for anxiety and depression, experiencing persistent nervousness and irritability, but denies thoughts of self-harm. He reports variable sleep quality and no significant issues with appetite or concentration.  He uses triamcinolone cream for skin issues, which he believes helps with spots on his face and ears. This is his s/o's prescription.   He smokes one to two cigarettes a day and has not checked his blood pressure at home recently. No significant headaches, chest pain, or leg swelling, but experiences occasional mild headaches. He is able to get around well as long as he avoids strenuous activities due to stomach discomfort.        11/09/2023   10:39 AM 08/09/2023   10:26 AM 06/14/2023    9:06 AM 11/01/2022   10:17 AM 06/12/2022    1:15 PM  Depression screen PHQ 2/9  Decreased Interest 1 3 0 0 0   Down, Depressed, Hopeless 1 3 0 0 0  PHQ - 2 Score 2 6 0 0 0  Altered sleeping 0 2  0 0  Tired, decreased energy 2 0  0 0  Change in appetite 0 0  0 0  Feeling bad or failure about yourself  0 0  0 0  Trouble concentrating 0 0  0 0  Moving slowly or fidgety/restless 0 0  0 0  Suicidal thoughts 0 0  0 0  PHQ-9 Score 4 8  0 0  Difficult doing work/chores  Not difficult at all  Not difficult at all       11/09/2023   10:44 AM 08/09/2023   10:27 AM 05/02/2022   10:46 AM 01/31/2022    3:02 PM  GAD 7 : Generalized Anxiety Score  Nervous, Anxious, on Edge 3 0 0 1  Control/stop worrying 0 0 0 1  Worry too much - different things 2 3 0 1  Trouble relaxing 0 3 0 0  Restless 0 3 0 0  Easily annoyed or irritable 2 3 0 0  Afraid - awful might happen 0 0 0 0  Total GAD 7 Score 7 12 0 3  Anxiety Difficulty  Not difficult at all  Not difficult at all        Relevant past medical, surgical, family, and social history reviewed and updated as indicated.  Allergies and medications reviewed and updated. Data reviewed: Chart in Epic.   Past Medical History:  Diagnosis Date   Anorexia  11/29/2010   Arthritis    Decreased vision    Depression    Dyspnea on exertion    Generalized headaches    High cholesterol 01/30/2017   Hypertension    Peripheral vascular disease (HCC)    Stroke [facility])     Past Surgical History:  Procedure Laterality Date   ABDOMINAL AORTAGRAM N/A 08/22/2011   Procedure: ABDOMINAL Tommi Fraise;  Surgeon: Margherita Shell, MD;  Location: [facility] CATH LAB;  Service: Cardiovascular;  Laterality: N/A;   aortogram     COLONOSCOPY N/A 06/07/2017   Procedure: COLONOSCOPY;  Surgeon: Ruby Corporal, MD;  Location: [facility] ENDO SUITE;  Service: Endoscopy;  Laterality: N/A;  150   COLONOSCOPY WITH PROPOFOL  N/A 01/18/2023   Procedure: COLONOSCOPY WITH PROPOFOL ;  Surgeon: Hargis Lias, MD;  Location: [facility] ENDO SUITE;  Service: Endoscopy;  Laterality: N/A;  1:00pm;asa 3   IR RADIOLOGIST EVAL  & MGMT  02/02/2023   PERCUTANEOUS STENT INTERVENTION Bilateral 08/22/2011   Procedure: PERCUTANEOUS STENT INTERVENTION;  Surgeon: Margherita Shell, MD;  Location[facility] CATH LAB;  Service: Cardiovascular;  Laterality: Bilateral;   POLYPECTOMY  01/18/2023   Procedure: POLYPECTOMY;  Surgeon: Hargis Lias, MD;  Location: [facility] ENDO SUITE;  Service: Endoscopy;;   Stents  August 22, 2011   Bilateral iliac PTA and stenting   VISCERAL ANGIOGRAPHY N/A 10/08/2023   Procedure: VISCERAL ANGIOGRAPHY;  Surgeon: Adine Hoof, MD;  Location: [facility] INVASIVE CV LAB;  Service: Cardiovascular;  Laterality: N/A;   VISCERAL ARTERY INTERVENTION N/A 10/08/2023   Procedure: VISCERAL ARTERY INTERVENTION;  Surgeon: Adine Hoof, MD;  Location: [facility] INVASIVE CV LAB;  Service: Cardiovascular;  Laterality: N/A;    Social History   Socioeconomic History   Marital status: Single    Spouse name: Not on file   Number of children: Not on file   Years of education: Not on file   Highest education level: Not on file  Occupational History   Not on file  Tobacco Use   Smoking status: Some Days    Current packs/day: 0.00    Average packs/day: 1 pack/day for 40.0 years (40.0 ttl pk-yrs)    Types: Cigarettes    Start date: 08/04/1972    Last attempt to quit: 08/04/2012    Years since quitting: 11.2   Smokeless tobacco: Never   Tobacco comments:    quit smoking 5 yrs ago. smoked one cigarette yesterday (09/25/2018)  Vaping Use   Vaping status: Former  Substance and Sexual Activity   Alcohol use: Yes    Comment: wine every other day; 2-3 a day on 02/20/2019   Drug use: Yes    Frequency: 3.0 times per week    Types: Marijuana   Sexual activity: Not on file  Other Topics Concern   Not on file  Social History Narrative   Lives with mother, Darlys Eland, who is currently under Hospice care-06/08/2021.   Brother and sister in law live nearby and helps with pt's mom.   Social Drivers of Corporate investment banker  Strain: Low Risk  (06/14/2023)   Overall Financial Resource Strain (CARDIA)    Difficulty of Paying Living Expenses: Not hard at all  Food Insecurity: No Food Insecurity (06/14/2023)   Hunger Vital Sign    Worried About Running Out of Food in the Last Year: Never true    Ran Out of Food in the Last Year: Never true  Transportation Needs: No Transportation Needs (06/14/2023)   PRAPARE - Transportation  Lack of Transportation (Medical): No    Lack of Transportation (Non-Medical): No  Physical Activity: Inactive (06/14/2023)   Exercise Vital Sign    Days of Exercise per Week: 0 days    Minutes of Exercise per Session: 0 min  Stress: No Stress Concern Present (06/14/2023)   [employer] of Occupational Health - Occupational Stress Questionnaire    Feeling of Stress : Only a little  Social Connections: Moderately Isolated (06/14/2023)   Social Connection and Isolation Panel    Frequency of Communication with Friends and Family: More than three times a week    Frequency of Social Gatherings with Friends and Family: Three times a week    Attends Religious Services: 1 to 4 times per year    Active Member of Clubs or Organizations: No    Attends Banker Meetings: Never    Marital Status: Never married  Intimate Partner Violence: Not At Risk (06/14/2023)   Humiliation, Afraid, Rape, and Kick questionnaire    Fear of Current or Ex-Partner: No    Emotionally Abused: No    Physically Abused: No    Sexually Abused: No    Outpatient Encounter Medications as of 11/09/2023  Medication Sig   aspirin  EC 81 MG tablet Take 81 mg by mouth daily. Swallow whole.   clopidogrel  (PLAVIX ) 75 MG tablet TAKE ONE TABLET DAILY   meloxicam  (MOBIC ) 7.5 MG tablet Take 1 tablet (7.5 mg total) by mouth daily.   potassium chloride  (KLOR-CON ) 10 MEQ tablet TAKE TWO TABLETS TWICE DAILY   sildenafil  (VIAGRA ) 50 MG tablet Take 1 tablet (50 mg total) by mouth daily as needed for erectile dysfunction.    [DISCONTINUED] atorvastatin  (LIPITOR) 80 MG tablet Take 1 tablet (80 mg total) by mouth daily.   [DISCONTINUED] desvenlafaxine  (PRISTIQ ) 25 MG 24 hr tablet Take 1 tablet (25 mg total) by mouth daily.   [DISCONTINUED] dicyclomine  (BENTYL ) 10 MG capsule TAKE ONE CAPSULE THREE TIMES DAILY BEFORE MEALS   atorvastatin  (LIPITOR) 80 MG tablet Take 1 tablet (80 mg total) by mouth daily.   desvenlafaxine  (PRISTIQ ) 25 MG 24 hr tablet Take 1 tablet (25 mg total) by mouth daily.   dicyclomine  (BENTYL ) 10 MG capsule Take 1 capsule (10 mg total) by mouth 4 (four) times daily -  before meals and at bedtime.   [DISCONTINUED] pravastatin (PRAVACHOL) 40 MG tablet Take 40 mg by mouth daily.     No facility-administered encounter medications on file as of 11/09/2023.    No Known Allergies  Pertinent ROS per HPI, otherwise unremarkable      Objective:  BP 122/60 (Cuff Size: Normal)   Pulse 60   Temp 97.8 F (36.6 C)   Ht 5' 9 (1.753 m)   Wt 159 lb 9.6 oz (72.4 kg)   SpO2 98%   BMI 23.57 kg/m    Wt Readings from Last 3 Encounters:  11/09/23 159 lb 9.6 oz (72.4 kg)  10/08/23 152 lb (68.9 kg)  09/26/23 155 lb 11.2 oz (70.6 kg)    Physical Exam Vitals and nursing note reviewed.  Constitutional:      Appearance: Normal appearance.  HENT:     Head: Normocephalic and atraumatic.     Nose: Nose normal.     Mouth/Throat:     Mouth: Mucous membranes are moist.   Eyes:     Conjunctiva/sclera: Conjunctivae normal.     Pupils: Pupils are equal, round, and reactive to light.   Neck:     Vascular: Carotid bruit  present.   Cardiovascular:     Rate and Rhythm: Normal rate and regular rhythm.     Heart sounds: Normal heart sounds.  Pulmonary:     Effort: Pulmonary effort is normal.     Breath sounds: Normal breath sounds.   Musculoskeletal:     Cervical back: Neck supple.     Right lower leg: No edema.     Left lower leg: No edema.   Skin:    General: Skin is warm and dry.     Capillary  Refill: Capillary refill takes less than 2 seconds.     Findings: Lesion (scattered, stuck-on appearing lesions to face) present.   Neurological:     General: No focal deficit present.     Mental Status: He is alert and oriented to person, place, and time.   Psychiatric:        Mood and Affect: Mood normal.        Behavior: Behavior normal.        Thought Content: Thought content normal.        Judgment: Judgment normal.     Results for orders placed or performed during the hospital encounter of 10/08/23  I-STAT, chem 8   Collection Time: 10/08/23 10:07 AM  Result Value Ref Range   Sodium 143 135 - 145 mmol/L   Potassium 3.7 3.5 - 5.1 mmol/L   Chloride 103 98 - 111 mmol/L   BUN 21 8 - 23 mg/dL   Creatinine, Ser 6.57 0.61 - 1.24 mg/dL   Glucose, Bld 98 70 - 99 mg/dL   Calcium , Ion 1.29 1.15 - 1.40 mmol/L   TCO2 28 22 - 32 mmol/L   Hemoglobin 13.9 13.0 - 17.0 g/dL   HCT 84.6 96.2 - 95.2 %  POCT Activated clotting time   Collection Time: 10/08/23  2:22 PM  Result Value Ref Range   Activated Clotting Time 205 seconds  POCT Activated clotting time   Collection Time: 10/08/23  3:33 PM  Result Value Ref Range   Activated Clotting Time 182 seconds       Pertinent labs & imaging results that were available during my care of the patient were reviewed by me and considered in my medical decision making.  Assessment & Plan:  Saifan was seen today for medical management of chronic issues.  Diagnoses and all orders for this visit:  Atherosclerosis of native artery of both lower extremities with intermittent claudication (HCC) -     atorvastatin  (LIPITOR) 80 MG tablet; Take 1 tablet (80 mg total) by mouth daily.  Moderate episode of recurrent major depressive disorder (HCC) -     desvenlafaxine  (PRISTIQ ) 25 MG 24 hr tablet; Take 1 tablet (25 mg total) by mouth daily.  Generalized anxiety disorder -     desvenlafaxine  (PRISTIQ ) 25 MG 24 hr tablet; Take 1 tablet (25 mg total) by  mouth daily.  Mixed hyperlipidemia -     atorvastatin  (LIPITOR) 80 MG tablet; Take 1 tablet (80 mg total) by mouth daily.  Lower abdominal pain -     dicyclomine  (BENTYL ) 10 MG capsule; Take 1 capsule (10 mg total) by mouth 4 (four) times daily -  before meals and at bedtime.  Mesenteric artery stenosis (HCC) -     atorvastatin  (LIPITOR) 80 MG tablet; Take 1 tablet (80 mg total) by mouth daily.  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 Reports discomfort but improved flow post-procedure. Iliac artery stents are patent, significant femoral artery disease, and heavily calcified aorta present. Prefers to avoid further interventions like bypass surgery unless necessary. Scheduled for follow-up with vascular specialist. - Continue high-dose atorvastatin , aspirin , and clopidogrel  as prescribed. - Follow up with vascular specialist on the 18th.  Abdominal discomfort Experiences persistent abdominal discomfort and spasms, partially managed with dicyclomine . Symptoms are aggravated by physical activity, causing frustration. - Refill dicyclomine  prescription. - Schedule appointment with GI specialist.  Anxiety and depression Prescribed venlafaxine for anxiety and depression. Reports fluctuating mood, persistent nervousness, and irritability. Denies suicidal ideation or significant depressive symptoms. Uncertain if symptoms are due to depression or everyday life stressors. - Ensure adherence to venlafaxine as prescribed. - Monitor for worsening symptoms of depression and anxiety and report if he occurs.  Skin lesions Has skin lesions on face and ears, previously treated with triamcinolone cream. Reports improvement but unsure of its appropriateness. Previously treated by an oncologist for skin lesions. - Confirm current prescription for skin  lesions with pharmacy. - Refer to dermatology for further evaluation and treatment.  Smoking Continues to smoke, albeit at a reduced rate of one to two cigarettes per day. - Encourage smoking cessation.  Follow-up Has upcoming appointments with vascular and cardiology specialists next week and a follow-up with the primary care provider in January. - Attend scheduled appointments with vascular and cardiology specialists next week. - Follow up with primary care provider in January or sooner if needed.          Continue all other maintenance medications.  Follow up plan: Return in about 3 months (around 02/09/2024), or if symptoms worsen or fail to improve, for chronic follow up .   Continue healthy lifestyle choices, including diet (rich in fruits, vegetables, and lean proteins, and low in salt and simple carbohydrates) and exercise (at least 30 minutes of moderate physical activity daily).    The above assessment and management plan was discussed with the patient. The patient verbalized understanding of and has agreed to the management plan. Patient is aware to call the clinic if they develop any new symptoms or if symptoms persist or worsen. Patient is aware when to return to the clinic for a follow-up visit. Patient educated on when it is appropriate to go to the emergency department.   Kattie Parrot, FNP-C [facility] [phone]

## 2023-11-14 ENCOUNTER — Other Ambulatory Visit (HOSPITAL_COMMUNITY): Payer: Self-pay | Admitting: Vascular Surgery

## 2023-11-14 ENCOUNTER — Ambulatory Visit (INDEPENDENT_AMBULATORY_CARE_PROVIDER_SITE_OTHER): Admitting: Physician Assistant

## 2023-11-14 ENCOUNTER — Encounter: Payer: Self-pay | Admitting: Physician Assistant

## 2023-11-14 ENCOUNTER — Ambulatory Visit (HOSPITAL_COMMUNITY)
Admission: RE | Admit: 2023-11-14 | Discharge: 2023-11-14 | Disposition: A | Discharge status: Home / Self Care | Source: Ambulatory Visit | Attending: Vascular Surgery | Admitting: Vascular Surgery

## 2023-11-14 VITALS — BP 140/81 | HR 69 | Temp 98.1°F | Ht 69.0 in | Wt 158.5 lb

## 2023-11-14 DIAGNOSIS — K551 Chronic vascular disorders of intestine: Secondary | ICD-10-CM | POA: Diagnosis not present

## 2023-11-14 DIAGNOSIS — R1030 Lower abdominal pain, unspecified: Secondary | ICD-10-CM | POA: Insufficient documentation

## 2023-11-14 DIAGNOSIS — I739 Peripheral vascular disease, unspecified: Secondary | ICD-10-CM

## 2023-11-14 DIAGNOSIS — I774 Celiac artery compression syndrome: Secondary | ICD-10-CM | POA: Diagnosis not present

## 2023-11-14 NOTE — Progress Notes (Signed)
 Office Note     CC:  follow up Requesting Provider:  Galvin Jules, FNP  HPI: Cameron Ware is a 63 y.o. (05-29-1961) male who presents for follow up after his visceral angiogram on 10/08/23 by Dr. Vikki Graves. He has history of bilateral common iliac artery stenting with ongoing claudication that is nonlife limiting. He also had presented with severe abdominal pain. This was mostly left lower quadrant pain not described as postprandial. He had no food fear or weight loss, but in setting of known mesenteric stenosis angiography was recommended. Angiogram findings per Dr. Mikle Alexanders Op note included: SMA is patent without stenosis initially but is diseased throughout its course even down to his major branch point but this is not amenable to percutaneous intervention. The celiac artery was occluded and I was able to access this and stent this to 0% residual stenosis and there was brisk flow into both the common hepatic and splenic vessels. The left gastric artery was not identified. The IMA is atretic at its takeoff although does fill the pelvis. He had successful Stenting of celiac artery with a 7 x 19 mm VBX.   Today he is here with his wife. He reports the pain he was having before is now resolved. He denies any food fear, post prandial pain, weight loss, nausea or vomiting. He says he feels like his appetite is normal. However, his wife feels he does not eat a lot. He does continue to have constant lower mid abdominal pain. He says this has been present for a long time and he feels it is unrelated. He explains that he was told  my stomach is getting thin down there. He says he was a heavy drinker for a long time and was told it is from that. He does feel like his stomach is more full and he feels like he is holding fluid. He says his weight is up a little from his normal. He denies any shortness of breath, chest pain or swelling in his legs. He denies any change in his lower extremity claudication. He  is still able to walk normally without discomfort. He still says he cannot do any real prolonged ambulation or anything that requires exerting himself. Pain resolves with rest. No pain on rest. No tissue loss. His recent ABIs were stable- right ABI is 0.58 and left ABI is 0.53  He is medically managed on Aspirin , Statin and Plavix . He restarted smoking and is smoking several cigarettes per day.   Past Medical History:  Diagnosis Date   Anorexia 11/29/2010   Arthritis    Decreased vision    Depression    Dyspnea on exertion    Generalized headaches    High cholesterol 01/30/2017   Hypertension    Peripheral vascular disease (HCC)    Stroke [facility])     Past Surgical History:  Procedure Laterality Date   ABDOMINAL AORTAGRAM N/A 08/22/2011   Procedure: ABDOMINAL Tommi Fraise;  Surgeon: Margherita Shell, MD;  Location: [facility] CATH LAB;  Service: Cardiovascular;  Laterality: N/A;   aortogram     COLONOSCOPY N/A 06/07/2017   Procedure: COLONOSCOPY;  Surgeon: Ruby Corporal, MD;  Location: [facility] ENDO SUITE;  Service: Endoscopy;  Laterality: N/A;  150   COLONOSCOPY WITH PROPOFOL  N/A 01/18/2023   Procedure: COLONOSCOPY WITH PROPOFOL ;  Surgeon: Hargis Lias, MD;  Location: [facility] ENDO SUITE;  Service: Endoscopy;  Laterality: N/A;  1:00pm;asa 3   IR RADIOLOGIST EVAL & MGMT  02/02/2023   PERCUTANEOUS STENT  INTERVENTION Bilateral 08/22/2011   Procedure: PERCUTANEOUS STENT INTERVENTION;  Surgeon: Margherita Shell, MD;  Location: [facility] CATH LAB;  Service: Cardiovascular;  Laterality: Bilateral;   POLYPECTOMY  01/18/2023   Procedure: POLYPECTOMY;  Surgeon: Hargis Lias, MD;  Location: [facility] ENDO SUITE;  Service: Endoscopy;;   Stents  August 22, 2011   Bilateral iliac PTA and stenting   VISCERAL ANGIOGRAPHY N/A 10/08/2023   Procedure: VISCERAL ANGIOGRAPHY;  Surgeon: Adine Hoof, MD;  Location: [facility] INVASIVE CV LAB;  Service: Cardiovascular;  Laterality: N/A;   VISCERAL ARTERY INTERVENTION N/A 10/08/2023    Procedure: VISCERAL ARTERY INTERVENTION;  Surgeon: Adine Hoof, MD;  Location: [facility] INVASIVE CV LAB;  Service: Cardiovascular;  Laterality: N/A;    Social History   Socioeconomic History   Marital status: Single    Spouse name: Not on file   Number of children: Not on file   Years of education: Not on file   Highest education level: Not on file  Occupational History   Not on file  Tobacco Use   Smoking status: Some Days    Current packs/day: 2.00    Average packs/day: 1 pack/day for 51.3 years (51.3 ttl pk-yrs)    Types: Cigarettes    Start date: 08/04/1972   Smokeless tobacco: Never   Tobacco comments:    quit smoking 5 yrs ago. smoked one cigarette yesterday (09/25/2018)  Vaping Use   Vaping status: Former  Substance and Sexual Activity   Alcohol use: Yes    Comment: wine every other day; 2-3 a day on 02/20/2019   Drug use: Yes    Frequency: 3.0 times per week    Types: Marijuana   Sexual activity: Not on file  Other Topics Concern   Not on file  Social History Narrative   Lives with mother, Darlys Eland, who is currently under Hospice care-06/08/2021.   Brother and sister in law live nearby and helps with pt's mom.   Social Drivers of Corporate investment banker Strain: Low Risk  (06/14/2023)   Overall Financial Resource Strain (CARDIA)    Difficulty of Paying Living Expenses: Not hard at all  Food Insecurity: No Food Insecurity (06/14/2023)   Hunger Vital Sign    Worried About Running Out of Food in the Last Year: Never true    Ran Out of Food in the Last Year: Never true  Transportation Needs: No Transportation Needs (06/14/2023)   PRAPARE - Administrator, Civil Service (Medical): No    Lack of Transportation (Non-Medical): No  Physical Activity: Inactive (06/14/2023)   Exercise Vital Sign    Days of Exercise per Week: 0 days    Minutes of Exercise per Session: 0 min  Stress: No Stress Concern Present (06/14/2023)   [employer] of Occupational  Health - Occupational Stress Questionnaire    Feeling of Stress : Only a little  Social Connections: Moderately Isolated (06/14/2023)   Social Connection and Isolation Panel    Frequency of Communication with Friends and Family: More than three times a week    Frequency of Social Gatherings with Friends and Family: Three times a week    Attends Religious Services: 1 to 4 times per year    Active Member of Clubs or Organizations: No    Attends Banker Meetings: Never    Marital Status: Never married  Intimate Partner Violence: Not At Risk (06/14/2023)   Humiliation, Afraid, Rape, and Kick questionnaire    Fear of Current  or Ex-Partner: No    Emotionally Abused: No    Physically Abused: No    Sexually Abused: No    Family History  Problem Relation Age of Onset   Heart disease Mother    Diabetes Father    Colon cancer Father     Current Outpatient Medications  Medication Sig Dispense Refill   aspirin  EC 81 MG tablet Take 81 mg by mouth daily. Swallow whole.     atorvastatin  (LIPITOR) 80 MG tablet Take 1 tablet (80 mg total) by mouth daily. 90 tablet 1   clopidogrel  (PLAVIX ) 75 MG tablet TAKE ONE TABLET DAILY 30 tablet 0   desvenlafaxine  (PRISTIQ ) 25 MG 24 hr tablet Take 1 tablet (25 mg total) by mouth daily. 90 tablet 1   dicyclomine  (BENTYL ) 10 MG capsule Take 1 capsule (10 mg total) by mouth 4 (four) times daily -  before meals and at bedtime. 90 capsule 1   meloxicam  (MOBIC ) 7.5 MG tablet Take 1 tablet (7.5 mg total) by mouth daily. 90 tablet 1   potassium chloride  (KLOR-CON ) 10 MEQ tablet TAKE TWO TABLETS TWICE DAILY 120 tablet 5   sildenafil  (VIAGRA ) 50 MG tablet Take 1 tablet (50 mg total) by mouth daily as needed for erectile dysfunction. 10 tablet 0   No current facility-administered medications for this visit.    No Known Allergies   REVIEW OF SYSTEMS:  [X]  denotes positive finding, [ ]  denotes negative finding Cardiac  Comments:  Chest pain or chest  pressure:    Shortness of breath upon exertion:    Short of breath when lying flat:    Irregular heart rhythm:        Vascular    Pain in calf, thigh, or hip brought on by ambulation:    Pain in feet at night that wakes you up from your sleep:     Blood clot in your veins:    Leg swelling:         Pulmonary    Oxygen at home:    Productive cough:     Wheezing:         Neurologic    Sudden weakness in arms or legs:     Sudden numbness in arms or legs:     Sudden onset of difficulty speaking or slurred speech:    Temporary loss of vision in one eye:     Problems with dizziness:         Gastrointestinal    Blood in stool:     Vomited blood:         Genitourinary    Burning when urinating:     Blood in urine:        Psychiatric    Major depression:         Hematologic    Bleeding problems:    Problems with blood clotting too easily:        Skin    Rashes or ulcers:        Constitutional    Fever or chills:      PHYSICAL EXAMINATION:  Vitals:   11/14/23 0925  BP: (!) 140/81  Pulse: 69  Temp: 98.1 F (36.7 C)  TempSrc: Temporal  SpO2: 95%  Weight: 158 lb 8 oz (71.9 kg)  Height: 5' 9 (1.753 m)    General:  WDWN in NAD; vital signs documented above Gait: Normal HENT: WNL, normocephalic Pulmonary: normal non-labored breathing Cardiac: regular HR Abdomen: distended, non tender Vascular Exam/Pulses: 2+ radial, 2+  femoral,  Extremities: without ischemic changes, without Gangrene , without cellulitis; without open wounds;  Musculoskeletal: no muscle wasting or atrophy  Neurologic: A&O X 3 Psychiatric:  The pt has Normal affect.   Non-Invasive Vascular Imaging:   Mesenteric duplex: Summary:  Mesenteric: 70 to 99% stenosis in the superior mesenteric artery. Unable to visualize  celiac artery stent struts, however the celiac artery is patent. Elevated celiac artery velocities may suggest >70% stenosis.   ASSESSMENT/PLAN:: 63 y.o. male here for follow up  after his visceral angiogram on 10/08/23 by Dr. Vikki Graves with stenting of his celiac artery. His left lower quadrant pain is resolved. He says he is without any food fear, post prandial pain, nausea, vomiting, unintended weight loss, decreased appetite. He does have some chronic mid lower abdominal pain. I do not think this is related to his chronic mesenteric ischemia. Recommend he follow up with his Gastroenterologist.  - Duplex today does not visualize the celiac artery stent struts but the artery is patent. His known SMA stenosis remains present -continue Aspirin , Statin, Plavix  - He is due for his Aorto/iliac duplex and ABI at his next visit so I will arrange these along with repeat mesenteric duplex in 6 months - he knows should his prior symptoms return to call for earlier follow up or if he has any new or concerning symptoms  Deneen Finical, PA-C [facility] [phone]  Clinic MD:   Vikki Graves

## 2023-11-15 ENCOUNTER — Other Ambulatory Visit: Payer: Self-pay | Admitting: *Deleted

## 2023-11-15 DIAGNOSIS — I70219 Atherosclerosis of native arteries of extremities with intermittent claudication, unspecified extremity: Secondary | ICD-10-CM

## 2023-11-15 DIAGNOSIS — I774 Celiac artery compression syndrome: Secondary | ICD-10-CM

## 2023-11-15 DIAGNOSIS — K551 Chronic vascular disorders of intestine: Secondary | ICD-10-CM

## 2023-11-15 DIAGNOSIS — I739 Peripheral vascular disease, unspecified: Secondary | ICD-10-CM

## 2023-12-05 ENCOUNTER — Other Ambulatory Visit: Payer: Self-pay | Admitting: Family Medicine

## 2023-12-05 DIAGNOSIS — I739 Peripheral vascular disease, unspecified: Secondary | ICD-10-CM

## 2023-12-07 ENCOUNTER — Other Ambulatory Visit: Payer: Self-pay | Admitting: Family Medicine

## 2023-12-07 DIAGNOSIS — R1030 Lower abdominal pain, unspecified: Secondary | ICD-10-CM

## 2023-12-25 ENCOUNTER — Ambulatory Visit: Payer: Self-pay | Admitting: Family Medicine

## 2023-12-25 NOTE — Telephone Encounter (Signed)
 FYI Only or Action Required?: FYI only for provider.  Patient was last seen in primary care on 11/09/2023 by [name], FNP.  Called Nurse Triage reporting Abdominal Pain.  Symptoms began a long time ago   Symptoms are: gradually worsening.  Triage Disposition: See Physician Within 24 Hours (overriding See PCP Within 2 Weeks)  Patient/caregiver understands and will follow disposition?: Yes              Copied from CRM [id number]. Topic: Clinical - Red Word Triage >> Dec 25, 2023  1:22 PM Elle L wrote: Red Word that prompted transfer to Nurse Triage: The patient's friend, Carole Kapur, states that the patient is experiencing severe stomach pain. Reason for Disposition  Abdominal pain is a chronic symptom (recurrent or ongoing AND present > 4 weeks)  Answer Assessment - Initial Assessment Questions This RN spoke with pt's friend, Carole. Per friend, PCP is aware of this ongoing issue. This RN educated pt friend on new-worsening symptoms, and when to call back/seek emergent care. Pt verbalized understanding and agrees to plan.    1. LOCATION: Where does it hurt?      Bottom of stomach 2. RADIATION: Does the pain shoot anywhere else? (e.g., chest, back)     Can't walk on legs for long period of timed due to wearing out 3. ONSET: When did the pain begin? (Minutes, hours or days ago)      Been going on for a long time getting worse 5. PATTERN Does the pain come and go, or is it constant?     Constant 6. SEVERITY: How bad is the pain?  (e.g., Scale 1-10; mild, moderate, or severe)     7  Protocols used: Abdominal Pain - Male-A-AH

## 2023-12-25 NOTE — Telephone Encounter (Signed)
 Noted. Appt 12/26/23 to discuss.

## 2023-12-26 ENCOUNTER — Ambulatory Visit (INDEPENDENT_AMBULATORY_CARE_PROVIDER_SITE_OTHER): Admitting: Family Medicine

## 2023-12-26 ENCOUNTER — Encounter: Payer: Self-pay | Admitting: Family Medicine

## 2023-12-26 VITALS — BP 137/70 | HR 70 | Temp 98.2°F | Ht 66.0 in | Wt 164.0 lb

## 2023-12-26 DIAGNOSIS — L989 Disorder of the skin and subcutaneous tissue, unspecified: Secondary | ICD-10-CM

## 2023-12-26 DIAGNOSIS — R109 Unspecified abdominal pain: Secondary | ICD-10-CM | POA: Diagnosis not present

## 2023-12-26 DIAGNOSIS — K551 Chronic vascular disorders of intestine: Secondary | ICD-10-CM

## 2023-12-26 DIAGNOSIS — G8929 Other chronic pain: Secondary | ICD-10-CM | POA: Diagnosis not present

## 2023-12-26 MED ORDER — CLOTRIMAZOLE-BETAMETHASONE 1-0.05 % EX CREA: 1.0000 | TOPICAL_CREAM | Freq: Every day | CUTANEOUS | 0 refills | Status: DC | PRN

## 2023-12-26 NOTE — Progress Notes (Signed)
 Subjective: CC: Chronic abdominal pain PCP: Cameron Rock HERO, FNP Cameron Ware is a 63 y.o. male presenting to clinic today for:  1.  Chronic abdominal pain Patient with known mesenteric ischemia.  He sees vascular for this and has stents in place.  He reports has had chronic low belly pain because he has thinning of the abdomen.  He has been seen by a specialist for this in the past and was told that they should just wait and watch until he is in so much pain that he has to get an intervention done.  He notes that he went to Dr. Norleen Hurst, Jr.'s office and was told that they no longer took his insurance and could not see him without paying out-of-pocket.  He is accompanied today by his spouse who is concerned because he needs to see his gastroenterologist.  Patient denies any nausea, vomiting, bloody stools.  His bowel movements are normal.  He has had no unplanned weight loss.  No urinary symptoms.  2.  Skin lesions He reports he has several areas of irritated skin lesions along his temple and cheek that he has been utilizing his wife's clotrimazole  with betamethasone  cream on.  Apparently this is what her dermatologist gave her for her's and this does seem to work well he is asking for his own supply   ROS: Per HPI  No Known Allergies Past Medical History:  Diagnosis Date   Anorexia 11/29/2010   Arthritis    Decreased vision    Depression    Dyspnea on exertion    Generalized headaches    High cholesterol 01/30/2017   Hypertension    Peripheral vascular disease (HCC)    Stroke [facility])     Current Outpatient Medications:    aspirin  EC 81 MG tablet, Take 81 mg by mouth daily. Swallow whole., Disp: , Rfl:    atorvastatin  (LIPITOR) 80 MG tablet, Take 1 tablet (80 mg total) by mouth daily., Disp: 90 tablet, Rfl: 1   clopidogrel  (PLAVIX ) 75 MG tablet, TAKE ONE TABLET DAILY, Disp: 30 tablet, Rfl: 5   desvenlafaxine  (PRISTIQ ) 25 MG 24 hr tablet, Take 1 tablet (25 mg  total) by mouth daily., Disp: 90 tablet, Rfl: 1   dicyclomine  (BENTYL ) 10 MG capsule, TAKE ONE CAPSULE FOUR TIMES DAILY BEFORE MEALS AND AT BEDTIME, Disp: 90 capsule, Rfl: 0   famotidine  (PEPCID ) 20 MG tablet, Take 20 mg by mouth 2 (two) times daily., Disp: , Rfl:    meloxicam  (MOBIC ) 7.5 MG tablet, Take 1 tablet (7.5 mg total) by mouth daily., Disp: 90 tablet, Rfl: 1   potassium chloride  (KLOR-CON ) 10 MEQ tablet, TAKE TWO TABLETS TWICE DAILY, Disp: 120 tablet, Rfl: 5   sildenafil  (VIAGRA ) 50 MG tablet, Take 1 tablet (50 mg total) by mouth daily as needed for erectile dysfunction., Disp: 10 tablet, Rfl: 0 Social History   Socioeconomic History   Marital status: Single    Spouse name: Not on file   Number of children: Not on file   Years of education: Not on file   Highest education level: Not on file  Occupational History   Not on file  Tobacco Use   Smoking status: Some Days    Current packs/day: 2.00    Average packs/day: 1 pack/day for 51.4 years (51.5 ttl pk-yrs)    Types: Cigarettes    Start date: 08/04/1972   Smokeless tobacco: Never   Tobacco comments:    quit smoking 5 yrs ago. smoked one cigarette  yesterday (09/25/2018)  Vaping Use   Vaping status: Former  Substance and Sexual Activity   Alcohol use: Yes    Comment: wine every other day; 2-3 a day on 02/20/2019   Drug use: Yes    Frequency: 3.0 times per week    Types: Marijuana   Sexual activity: Not on file  Other Topics Concern   Not on file  Social History Narrative   Lives with mother, Cameron Ware, who is currently under Hospice care-06/08/2021.   Brother and sister in law live nearby and helps with pt's mom.   Social Drivers of Corporate investment banker Strain: Low Risk  (06/14/2023)   Overall Financial Resource Strain (CARDIA)    Difficulty of Paying Living Expenses: Not hard at all  Food Insecurity: No Food Insecurity (06/14/2023)   Hunger Vital Sign    Worried About Running Out of Food in the Last Year: Never  true    Ran Out of Food in the Last Year: Never true  Transportation Needs: No Transportation Needs (06/14/2023)   PRAPARE - Administrator, Civil Service (Medical): No    Lack of Transportation (Non-Medical): No  Physical Activity: Inactive (06/14/2023)   Exercise Vital Sign    Days of Exercise per Week: 0 days    Minutes of Exercise per Session: 0 min  Stress: No Stress Concern Present (06/14/2023)   [employer] of Occupational Health - Occupational Stress Questionnaire    Feeling of Stress : Only a little  Social Connections: Moderately Isolated (06/14/2023)   Social Connection and Isolation Panel    Frequency of Communication with Friends and Family: More than three times a week    Frequency of Social Gatherings with Friends and Family: Three times a week    Attends Religious Services: 1 to 4 times per year    Active Member of Clubs or Organizations: No    Attends Banker Meetings: Never    Marital Status: Never married  Intimate Partner Violence: Not At Risk (06/14/2023)   Humiliation, Afraid, Rape, and Kick questionnaire    Fear of Current or Ex-Partner: No    Emotionally Abused: No    Physically Abused: No    Sexually Abused: No   Family History  Problem Relation Age of Onset   Heart disease Mother    Diabetes Father    Colon cancer Father     Objective: Office vital signs reviewed. BP 137/70   Pulse 70   Temp 98.2 F (36.8 C)   Ht 5' 6 (1.676 m)   Wt 164 lb (74.4 kg)   SpO2 97%   BMI 26.47 kg/m   Physical Examination:  General: Awake, alert, well nourished, No acute distress HEENT: sclera white, MMM GI: Protuberant, distended abdomen.  Nontender to palpation.  No palpable masses.  GU: no suprapubic tenderness palpation  Assessment/ Plan: 63 y.o. male   Chronic abdominal pain  Mesenteric artery stenosis (HCC)  Skin lesion of face - Plan: clotrimazole -betamethasone  (LOTRISONE ) cream  This is a chronic issue for patient.  It  sounds like he was confusing his dermatologist with his gastroenterologist and I clarified with the patient who his gastroenterologist is and who he needs to follow-up with for chronic abdominal pain.  I printed out their information and photographs so that he could reach out to them for an appointment.  I did go ahead and give him his own personal supply of clotrimazole  with betamethasone  but we discussed the impact it may have  on the face including thinning of skin, discoloration.  I advised him to use this very sparingly and maybe follow-up with some dermatologist at some point if these are an ongoing issue  He also may consider cryoablation with his PCP  Total time spent with patient 31 minutes.  Greater than 50% of encounter spent in coordination of care/counseling.    Norene CHRISTELLA Fielding, DO [facility] [phone]

## 2024-01-04 ENCOUNTER — Other Ambulatory Visit: Payer: Self-pay | Admitting: Family Medicine

## 2024-01-04 DIAGNOSIS — R1030 Lower abdominal pain, unspecified: Secondary | ICD-10-CM

## 2024-01-07 ENCOUNTER — Encounter (INDEPENDENT_AMBULATORY_CARE_PROVIDER_SITE_OTHER): Payer: Self-pay | Admitting: Gastroenterology

## 2024-01-07 ENCOUNTER — Ambulatory Visit (INDEPENDENT_AMBULATORY_CARE_PROVIDER_SITE_OTHER): Admitting: Gastroenterology

## 2024-01-07 VITALS — BP 133/80 | HR 63 | Temp 98.3°F | Ht 66.0 in | Wt 162.2 lb

## 2024-01-07 DIAGNOSIS — Z800 Family history of malignant neoplasm of digestive organs: Secondary | ICD-10-CM

## 2024-01-07 DIAGNOSIS — R1030 Lower abdominal pain, unspecified: Secondary | ICD-10-CM

## 2024-01-07 DIAGNOSIS — I774 Celiac artery compression syndrome: Secondary | ICD-10-CM

## 2024-01-07 MED ORDER — DICYCLOMINE HCL 10 MG PO CAPS: 10.0000 mg | ORAL_CAPSULE | Freq: Three times a day (TID) | ORAL | 0 refills | Status: DC

## 2024-01-07 MED ORDER — POLYETHYLENE GLYCOL 3350 17 GM/SCOOP PO POWD: 8.5000 g | Freq: Every day | ORAL | Status: AC

## 2024-01-07 NOTE — Progress Notes (Signed)
 Cameron Ware Cameron Ware , M.D. Gastroenterology & Hepatology [name]enter[facility] [address] [place], [place] [address] Primary Care Physician: [name], FNP [address] [place] [address]  Chief Complaint: Chronic lower abdominal pain  History of Present Illness: Cameron Ware is a 63 y.o. male with  arthritis, depression, high cholesterol, HTN, PVD, stroke , chronic mesenteric ischemia status post stent placement 09/2023 who presents for evaluation of chronic lower abdominal pain  Patient was seen last by [name] 03/2023.  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 [name]  Patient is again presenting with lower abdominal pain.  Patient reports pain located at the lower abdomen without any relieving or aggravating factor.  Pain does changes with movement.  He reports regular bowel movement daily without any straining.  Patient has been taking meloxicam .  He has been smoking daily but has reduced the frequency  Last Colonoscopy: 12/2022- Diverticulosis in the left colon.                           - One 7 mm polyp in the sigmoid colon, removed with                            a cold snare. Resected and retrieved.                           - Non-bleeding external and internal hemorrhoids. (1 TA)  Recommendations:  Repeat colonoscopy 5 years   FHx:  he reports CRC history in his father, believes father was diagnosed in his 80s Social: He is still smoking maybe 2-3 cigarettes per day    Past Medical History: Past Medical History:  Diagnosis Date   Anorexia 11/29/2010   Arthritis    Decreased vision    Depression    Dyspnea on exertion    Generalized headaches    High cholesterol 01/30/2017   Hypertension    Peripheral vascular disease (HCC)    Stroke [facility])     Past Surgical History: Past Surgical History:  Procedure Laterality Date   ABDOMINAL AORTAGRAM N/A  08/22/2011   Procedure: ABDOMINAL EZELLA;  Surgeon: Gaile LELON New, MD;  Location: [facility] CATH LAB;  Service: Cardiovascular;  Laterality: N/A;   aortogram     COLONOSCOPY N/A 06/07/2017   Procedure: COLONOSCOPY;  Surgeon: [name], MD;  Location: [facility] ENDO SUITE;  Service: Endoscopy;  Laterality: N/A;  150   COLONOSCOPY WITH PROPOFOL  N/A 01/18/2023   Procedure: COLONOSCOPY WITH PROPOFOL ;  Surgeon: [name], MD;  Location: [facility] ENDO SUITE;  Service: Endoscopy;  Laterality: N/A;  1:00pm;asa 3   IR RADIOLOGIST EVAL & MGMT  02/02/2023   PERCUTANEOUS STENT INTERVENTION Bilateral 08/22/2011   Procedure: PERCUTANEOUS STENT INTERVENTION;  Surgeon: Gaile LELON New, MD;  Location: [facility] CATH LAB;  Service: Cardiovascular;  Laterality: Bilateral;   POLYPECTOMY  01/18/2023   Procedure: POLYPECTOMY;  Surgeon: [name], MD;  Location: [facility] ENDO [facility];  Service: Endoscopy;;   Stents  August 22, 2011   Bilateral iliac PTA and stenting   VISCERAL ANGIOGRAPHY N/A 10/08/2023   Procedure: VISCERAL ANGIOGRAPHY;  Surgeon: [name], MD;  Location: [facility] INVASIVE CV LAB;  Service: Cardiovascular;  Laterality: N/A;   VISCERAL ARTERY INTERVENTION N/A 10/08/2023   Procedure: VISCERAL  ARTERY INTERVENTION;  Surgeon: [name], MD;  Location: [facility] INVASIVE CV LAB;  Service: Cardiovascular;  Laterality: N/A;    Family History: Family History  Problem Relation Age of Onset   Heart disease Mother    Diabetes Father    Colon cancer Father     Social History: Social History   Tobacco Use  Smoking Status Some Days   Current packs/day: 2.00   Average packs/day: 1 pack/day for 51.4 years (51.6 ttl pk-yrs)   Types: Cigarettes   Start date: 08/04/1972  Smokeless Tobacco Never  Tobacco Comments   quit smoking 5 yrs ago. smoked one cigarette yesterday (09/25/2018)   Social History   Substance and Sexual Activity  Alcohol Use Yes   Comment: wine every other day; 2-3 a day on  02/20/2019   Social History   Substance and Sexual Activity  Drug Use Yes   Frequency: 3.0 times per week   Types: Marijuana    Allergies: No Known Allergies  Medications: Current Outpatient Medications  Medication Sig Dispense Refill   aspirin  EC 81 MG tablet Take 81 mg by mouth daily. Swallow whole.     atorvastatin  (LIPITOR) 80 MG tablet Take 1 tablet (80 mg total) by mouth daily. 90 tablet 1   clopidogrel  (PLAVIX ) 75 MG tablet TAKE ONE TABLET DAILY 30 tablet 5   clotrimazole -betamethasone  (LOTRISONE ) cream Apply 1 Application topically daily as needed (max 7 days per use.). 30 g 0   desvenlafaxine  (PRISTIQ ) 25 MG 24 hr tablet Take 1 tablet (25 mg total) by mouth daily. 90 tablet 1   dicyclomine  (BENTYL ) 10 MG capsule TAKE ONE CAPSULE FOUR TIMES DAILY BEFORE MEALS AND AT BEDTIME 90 capsule 0   famotidine  (PEPCID ) 20 MG tablet Take 20 mg by mouth 2 (two) times daily.     meloxicam  (MOBIC ) 7.5 MG tablet Take 1 tablet (7.5 mg total) by mouth daily. 90 tablet 1   potassium chloride  (KLOR-CON ) 10 MEQ tablet TAKE TWO TABLETS TWICE DAILY 120 tablet 5   sildenafil  (VIAGRA ) 50 MG tablet Take 1 tablet (50 mg total) by mouth daily as needed for erectile dysfunction. 10 tablet 0   No current facility-administered medications for this visit.    Review of Systems: GENERAL: negative for malaise, night sweats HEENT: No changes in hearing or vision, no nose bleeds or other nasal problems. NECK: Negative for lumps, goiter, pain and significant neck swelling RESPIRATORY: Negative for cough, wheezing CARDIOVASCULAR: Negative for chest pain, leg swelling, palpitations, orthopnea GI: SEE HPI MUSCULOSKELETAL: Negative for joint pain or swelling, back pain, and muscle pain. SKIN: Negative for lesions, rash HEMATOLOGY Negative for prolonged bleeding, bruising easily, and swollen nodes. ENDOCRINE: Negative for cold or heat intolerance, polyuria, polydipsia and goiter. NEURO: negative for tremor,  gait imbalance, syncope and seizures. The remainder of the review of systems is noncontributory.   Physical Exam: BP 133/80   Pulse 63   Temp 98.3 F (36.8 C)   Ht 5' 6 (1.676 m)   Wt 162 lb 3.2 oz (73.6 kg)   BMI 26.18 kg/m  GENERAL: The patient is AO x3, in no acute distress. HEENT: Head is normocephalic and atraumatic. EOMI are intact. Mouth is well hydrated and without lesions. NECK: Supple. No masses LUNGS: Clear to auscultation. No presence of rhonchi/wheezing/rales. Adequate chest expansion HEART: RRR, normal s1 and s2. ABDOMEN: Soft, nontender, no guarding, no peritoneal signs, and nondistended. BS +. No masses.  Imaging/Labs: as above     Latest Ref Rng &  Units 10/08/2023   10:07 AM 08/09/2023   10:36 AM 02/08/2023   10:53 AM  CBC  WBC 3.4 - 10.8 x10E3/uL  8.9  9.6   Hemoglobin 13.0 - 17.0 g/dL 86.0  84.9  84.2   Hematocrit 39.0 - 52.0 % 41.0  44.9  47.9   Platelets 150 - 450 x10E3/uL  306  318    No results found for: IRON, TIBC, FERRITIN  I personally reviewed and interpreted the available labs, imaging and endoscopic files. 08/2023  IMPRESSION: 1. Celiac artery: Moderate to severe stenosis in the proximal segment estimated greater than 75%. 2. SMA: Moderate stenosis in the proximal segment estimated at 60%. 3. Right main renal artery: Moderate stenosis in the proximal segment estimated at 50%. 4. Right iliac system: Patent stent material. Moderate stenosis in the distal right external iliac artery. Moderate common femoral disease. 5. Left iliac system: Patent stent material. Moderate disease in the distal left external iliac artery. Severe left common femoral disease.   CTA 1. Interval progression of celiac ostial severe stenosis and proximal SMA severe stenosis since 2018 comparison. In a patient with postprandial abdominal pain and weight loss, these findings are consistent with chronic mesenteric ischemia. 2. Short segment occlusion versus  severe stenosis about the left common femoral artery secondary to fibro fatty atherosclerotic plaque. 3. Patent indwelling bilateral common and external iliac artery stents. 4.  Aortic Atherosclerosis (ICD10-I70.0).   1. No acute abdominopelvic abnormality. 2. Unchanged nonobstructive right nephrolithiasis. 3. Diverticulosis.   Impression and Plan: Cameron Ware is a 63 y.o. 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  The could be competent of IBS unspecified type  Since pain worsens with movement and extraneous activity could be musculoskeletal in nature  -Avoid NSAIDs including meloxicam  -Smoking cessation advised -Recommend patient to continue follow-up with vascular surgery/IR, and would need surveillance imaging to assess patency of the vasculature - Repeat colonoscopy in 2029 -Will start Bentyl  and Ibgard -PPI daily -Ensure patient is having adequate bowel movements: Add MiraLAX  daily to bowel regimen  All questions were answered.      Joseangel Nettleton Cameron Murry Khiev, MD Gastroenterology and Hepatology [facility]   This chart has been completed using [facility] Dictation software, and while attempts have been made to ensure accuracy , certain words and phrases may not be transcribed as intended

## 2024-01-07 NOTE — Patient Instructions (Signed)
 It was very nice to meet you today, as dicussed with will plan for the following :  1) Avoid using high dose aspirin  including Goody/BC powders, NSAIDs such as Aleve, ibuprofen, naproxen, Motrin, Voltaren or Advil (even the topical ones)  2) Bentyl  2-3 times daily   3) IB Guard 1-2 times daily  4)Ensure adequate fluid intake: Aim for 8 glasses of water  daily. Follow a high fiber diet: Include foods such as dates, prunes, pears, and kiwi. Take Miralax  twice a day for the first week, then reduce to once daily thereafter.

## 2024-02-06 ENCOUNTER — Other Ambulatory Visit: Payer: Self-pay | Admitting: Family Medicine

## 2024-02-12 ENCOUNTER — Ambulatory Visit: Admitting: Family Medicine

## 2024-02-12 ENCOUNTER — Encounter: Payer: Self-pay | Admitting: Family Medicine

## 2024-02-12 VITALS — BP 122/69 | HR 66 | Temp 97.6°F | Ht 66.0 in | Wt 165.4 lb

## 2024-02-12 DIAGNOSIS — L570 Actinic keratosis: Secondary | ICD-10-CM | POA: Diagnosis not present

## 2024-02-12 DIAGNOSIS — L821 Other seborrheic keratosis: Secondary | ICD-10-CM

## 2024-02-12 DIAGNOSIS — L989 Disorder of the skin and subcutaneous tissue, unspecified: Secondary | ICD-10-CM | POA: Diagnosis not present

## 2024-02-12 NOTE — Progress Notes (Signed)
 Subjective: Cameron Ware lesions PCP: [name], FNP YEP:Gzddpz W Scheffel is a 63 y.o. male presenting to clinic today for:  History of Present Illness  Patient here for lesions on face.  He was under care of Dr. Shona but apparently Dr. Shona no longer takes his insurance so he has been lost to follow-up.  He is requesting cryoablation of several lesions on his face.  Apparently his mother had a history of some type of skin cancer.  He thinks melanoma but is not quite sure.  He does not report any easy bleeding or changes in color of the lesions but the 1 on the left cheek seems to be getting bigger    ROS: Per HPI  No Known Allergies Past Medical History:  Diagnosis Date   Anorexia 11/29/2010   Arthritis    Decreased vision    Depression    Dyspnea on exertion    Generalized headaches    High cholesterol 01/30/2017   Hypertension    Peripheral vascular disease (HCC)    Stroke [facility])     Current Outpatient Medications:    aspirin  EC 81 MG tablet, Take 81 mg by mouth daily. Swallow whole., Disp: , Rfl:    atorvastatin  (LIPITOR) 80 MG tablet, Take 1 tablet (80 mg total) by mouth daily., Disp: 90 tablet, Rfl: 1   clopidogrel  (PLAVIX ) 75 MG tablet, TAKE ONE TABLET DAILY, Disp: 30 tablet, Rfl: 5   clotrimazole -betamethasone  (LOTRISONE ) cream, Apply 1 Application topically daily as needed (max 7 days per use.)., Disp: 30 g, Rfl: 0   desvenlafaxine  (PRISTIQ ) 25 MG 24 hr tablet, Take 1 tablet (25 mg total) by mouth daily., Disp: 90 tablet, Rfl: 1   dicyclomine  (BENTYL ) 10 MG capsule, Take 1 capsule (10 mg total) by mouth 4 (four) times daily -  before meals and at bedtime., Disp: 90 capsule, Rfl: 0   famotidine  (PEPCID ) 20 MG tablet, TAKE ONE TABLET TWICE DAILY, Disp: 180 tablet, Rfl: 0   polyethylene glycol powder (GLYCOLAX /MIRALAX ) 17 GM/SCOOP powder, Take 8.5 g by mouth daily., Disp: , Rfl:    potassium chloride  (KLOR-CON ) 10 MEQ tablet, TAKE TWO TABLETS TWICE DAILY, Disp: 120  tablet, Rfl: 5   sildenafil  (VIAGRA ) 50 MG tablet, Take 1 tablet (50 mg total) by mouth daily as needed for erectile dysfunction., Disp: 10 tablet, Rfl: 0 Social History   Socioeconomic History   Marital status: Single    Spouse name: Not on file   Number of children: Not on file   Years of education: Not on file   Highest education level: Not on file  Occupational History   Not on file  Tobacco Use   Smoking status: Some Days    Current packs/day: 2.00    Average packs/day: 1 pack/day for 51.5 years (51.8 ttl pk-yrs)    Types: Cigarettes    Start date: 08/04/1972   Smokeless tobacco: Never   Tobacco comments:    quit smoking 5 yrs ago. smoked one cigarette yesterday (09/25/2018)  Vaping Use   Vaping status: Former  Substance and Sexual Activity   Alcohol use: Yes    Comment: wine every other day; 2-3 a day on 02/20/2019   Drug use: Yes    Frequency: 3.0 times per week    Types: Marijuana   Sexual activity: Not on file  Other Topics Concern   Not on file  Social History Narrative   Lives with mother, Lynder, who is currently under Hospice care-06/08/2021.   Brother and  sister in law live nearby and helps with pt's mom.   Social Drivers of Corporate investment banker Strain: Low Risk  (06/14/2023)   Overall Financial Resource Strain (CARDIA)    Difficulty of Paying Living Expenses: Not hard at all  Food Insecurity: No Food Insecurity (06/14/2023)   Hunger Vital Sign    Worried About Running Out of Food in the Last Year: Never true    Ran Out of Food in the Last Year: Never true  Transportation Needs: No Transportation Needs (06/14/2023)   PRAPARE - Administrator, Civil Service (Medical): No    Lack of Transportation (Non-Medical): No  Physical Activity: Inactive (06/14/2023)   Exercise Vital Sign    Days of Exercise per Week: 0 days    Minutes of Exercise per Session: 0 min  Stress: No Stress Concern Present (06/14/2023)   [employer] of Occupational  Health - Occupational Stress Questionnaire    Feeling of Stress : Only a little  Social Connections: Moderately Isolated (06/14/2023)   Social Connection and Isolation Panel    Frequency of Communication with Friends and Family: More than three times a week    Frequency of Social Gatherings with Friends and Family: Three times a week    Attends Religious Services: 1 to 4 times per year    Active Member of Clubs or Organizations: No    Attends Banker Meetings: Never    Marital Status: Never married  Intimate Partner Violence: Not At Risk (06/14/2023)   Humiliation, Afraid, Rape, and Kick questionnaire    Fear of Current or Ex-Partner: No    Emotionally Abused: No    Physically Abused: No    Sexually Abused: No   Family History  Problem Relation Age of Onset   Heart disease Mother    Diabetes Father    Colon cancer Father     Objective: Office vital signs reviewed. BP 122/69   Pulse 66   Temp 97.6 F (36.4 C)   Ht 5' 6 (1.676 m)   Wt 165 lb 6 oz (75 kg)   SpO2 96%   BMI 26.69 kg/m   Physical Examination:  General: Awake, alert, well nourished, No acute distress Skin: Pea-sized hyperkeratotic lesion noted along the left cheekbone.  He has seborrheic keratoses noted along bilateral temples with the left temple having an adjacent actinic keratosis.  He has actinic keratoses x 1 on each ventral forearm and each apex of the helix of the ears     Cryotherapy Procedure:  Risks and benefits of procedure were reviewed with the patient.  Written consent obtained and scanned into the chart.  Lesions of concern was identified and located on both temples (1 each), both helix (1 each), both forearms (x1 each) and left cheek.  Liquid nitrogen was applied to area of concern and extending out 1 millimeters beyond the border of the lesion.  Treated area was allowed to come back to room temperature before treating it a second time.  Patient tolerated procedure well and there were  no immediate complications.  Home care instructions were reviewed with the patient and a handout was provided.   Assessment & Plan    Skin lesion of face  Actinic keratoses  Seborrheic keratoses  I went ahead and treated the skin lesion of the face with cryoablation but we discussed this likely needs excision and he will set up an appoint with Dr. Shona.  Will try and get these notes faxed to  him.  With regards to actinic keratoses I treated several along the face, helix of bilateral ears and forearms to total 6 lesions.  He had no immediate complications.  Home care instructions were reviewed and reasons for reevaluation discussed.  Handout provided.  Supportive care for seborrheic keratosis we discussed benign nature of these   Darianna Amy CHRISTELLA Fielding, DO [facility] [phone]

## 2024-02-12 NOTE — Patient Instructions (Signed)
 Cryoablation, Care After The following information offers guidance on how to care for yourself after your procedure. Your health care provider may also give you more specific instructions. If you have problems or questions, contact your health care provider. What can I expect after the procedure? After the procedure, it is common to have: Redness or blisters near the area treated. Mild pain and swelling. Follow these instructions at home: Treatment area care  If you have an incision, follow instructions from your health care provider about how to take care of it. Make sure you: Wash your hands with soap and water  for at least 20 seconds before and after you change your bandage (dressing). If soap and water  are not available, use hand sanitizer. Change your dressing as told by your health care provider. Leave stitches (sutures), skin glue, or adhesive strips in place. These skin closures may need to stay in place for 2 weeks or longer. If adhesive strip edges start to loosen and curl up, you may trim the loose edges. Do not remove adhesive strips completely unless your health care provider tells you to do that. Check your treatment area every day for signs of infection. Check for: More redness, swelling, or pain. Fluid or blood. Warmth. Pus or a bad smell. Keep the treated area clean and dry. Keep it covered with a dressing until it has healed. Clean the area with soap and water  as told by your health care provider. If your dressing gets wet, change it right away. Activity  Follow instructions from your health care provider about what activities are safe for you. You may have to avoid lifting. Ask your health care provider how much you can safely lift. If you were given a sedative during the procedure, it can affect you for several hours. Do not drive or operate machinery until your health care provider says that it is safe. General instructions Take over-the-counter and prescription  medicines only as told by your health care provider. Do not use any products that contain nicotine or tobacco. These products include cigarettes, chewing tobacco, and vaping devices, such as e-cigarettes. These can delay incision healing. If you need help quitting, ask your health care provider. Do not take baths, swim, or use a hot tub until your health care provider approves. Ask your health care provider if you may take showers. You may only be allowed to take sponge baths. Keep all follow-up visits. Your health care provider may need to check that treatment worked and that there were no problems caused by the procedure. Contact a health care provider if: You have more pain. You have a fever. You have nausea or vomiting. You have any signs of infection. You do not have a bowel movement for 2 days. You cannot urinate, or you cannot control when you urinate or have a bowel movement (have incontinence). You develop impotence. Get help right away if: You have severe pain. You have trouble swallowing or breathing. You are very weak or dizzy. You have chest pain or shortness of breath. These symptoms may be an emergency. Get help right away. Call 911. Do not wait to see if the symptoms will go away. Do not drive yourself to the hospital. This information is not intended to replace advice given to you by your health care provider. Make sure you discuss any questions you have with your health care provider. Document Revised: 10/28/2021 Document Reviewed: 10/28/2021 Elsevier Patient Education  2024 [employer].

## 2024-02-15 ENCOUNTER — Ambulatory Visit

## 2024-02-15 DIAGNOSIS — Z23 Encounter for immunization: Secondary | ICD-10-CM | POA: Diagnosis not present

## 2024-02-22 ENCOUNTER — Other Ambulatory Visit: Payer: Self-pay | Admitting: Family Medicine

## 2024-02-22 DIAGNOSIS — R1030 Lower abdominal pain, unspecified: Secondary | ICD-10-CM

## 2024-02-22 DIAGNOSIS — G8929 Other chronic pain: Secondary | ICD-10-CM

## 2024-02-26 ENCOUNTER — Telehealth: Payer: Self-pay | Admitting: Family Medicine

## 2024-02-26 ENCOUNTER — Other Ambulatory Visit (INDEPENDENT_AMBULATORY_CARE_PROVIDER_SITE_OTHER): Payer: Self-pay | Admitting: Gastroenterology

## 2024-02-26 DIAGNOSIS — R1030 Lower abdominal pain, unspecified: Secondary | ICD-10-CM

## 2024-02-26 NOTE — Telephone Encounter (Signed)
 Copied from CRM #[id number]. Topic: Clinical - Medication Question >> Feb 26, 2024  3:07 PM Yolanda T wrote: Reason for CRM: patient called requesting provider contact [facility] to let them know what medication needs to be filled or not. He stated he is taking a medication 4x a day and that is the only one he can think of. Please f/u with pharmacy.

## 2024-02-27 NOTE — Telephone Encounter (Signed)
Called patient back and answered questions. °

## 2024-03-04 ENCOUNTER — Other Ambulatory Visit: Payer: Self-pay | Admitting: Family Medicine

## 2024-03-04 DIAGNOSIS — R1030 Lower abdominal pain, unspecified: Secondary | ICD-10-CM

## 2024-03-11 ENCOUNTER — Other Ambulatory Visit: Payer: Self-pay | Admitting: Interventional Radiology

## 2024-03-11 DIAGNOSIS — K551 Chronic vascular disorders of intestine: Secondary | ICD-10-CM

## 2024-03-24 ENCOUNTER — Other Ambulatory Visit: Payer: Self-pay | Admitting: Family Medicine

## 2024-04-02 ENCOUNTER — Encounter (INDEPENDENT_AMBULATORY_CARE_PROVIDER_SITE_OTHER): Payer: Self-pay | Admitting: Gastroenterology

## 2024-04-15 ENCOUNTER — Other Ambulatory Visit: Payer: Self-pay | Admitting: *Deleted

## 2024-04-15 ENCOUNTER — Other Ambulatory Visit: Payer: Self-pay | Admitting: Family Medicine

## 2024-04-15 DIAGNOSIS — K219 Gastro-esophageal reflux disease without esophagitis: Secondary | ICD-10-CM

## 2024-04-15 DIAGNOSIS — R1030 Lower abdominal pain, unspecified: Secondary | ICD-10-CM

## 2024-04-30 ENCOUNTER — Ambulatory Visit: Admitting: Physician Assistant

## 2024-04-30 ENCOUNTER — Ambulatory Visit (HOSPITAL_BASED_OUTPATIENT_CLINIC_OR_DEPARTMENT_OTHER)
Admission: RE | Admit: 2024-04-30 | Discharge: 2024-04-30 | Disposition: A | Source: Ambulatory Visit | Attending: Surgery | Admitting: Surgery

## 2024-04-30 ENCOUNTER — Ambulatory Visit (HOSPITAL_COMMUNITY)
Admission: RE | Admit: 2024-04-30 | Discharge: 2024-04-30 | Disposition: A | Discharge status: Home / Self Care | Source: Ambulatory Visit | Attending: Surgery | Admitting: Surgery

## 2024-04-30 VITALS — BP 154/88 | HR 62 | Temp 98.5°F | Ht 66.0 in | Wt 163.0 lb

## 2024-04-30 DIAGNOSIS — I70219 Atherosclerosis of native arteries of extremities with intermittent claudication, unspecified extremity: Secondary | ICD-10-CM

## 2024-04-30 DIAGNOSIS — I739 Peripheral vascular disease, unspecified: Secondary | ICD-10-CM | POA: Insufficient documentation

## 2024-04-30 DIAGNOSIS — K551 Chronic vascular disorders of intestine: Secondary | ICD-10-CM

## 2024-04-30 DIAGNOSIS — I774 Celiac artery compression syndrome: Secondary | ICD-10-CM | POA: Insufficient documentation

## 2024-04-30 LAB — VAS US ABI WITH/WO TBI
Left ABI: 0.49
Right ABI: 0.37

## 2024-04-30 NOTE — Progress Notes (Unsigned)
 Office Note   History of Present Illness   Cameron Ware is a 63 y.o. (01/04/1961) male who presents for surveillance of PAD. They have a history of ***  The patient returns today for follow up. He/she denies any recent medical history changes. The patient also denies any claudication, rest pain, or tissue loss of the lower extremities.  Current Outpatient Medications  Medication Sig Dispense Refill   aspirin  EC 81 MG tablet Take 81 mg by mouth daily. Swallow whole.     atorvastatin  (LIPITOR) 80 MG tablet Take 1 tablet (80 mg total) by mouth daily. 90 tablet 1   clopidogrel  (PLAVIX ) 75 MG tablet TAKE ONE TABLET DAILY 30 tablet 5   clotrimazole -betamethasone  (LOTRISONE ) cream Apply 1 Application topically daily as needed (max 7 days per use.). 30 g 0   desvenlafaxine  (PRISTIQ ) 25 MG 24 hr tablet Take 1 tablet (25 mg total) by mouth daily. 90 tablet 1   dicyclomine  (BENTYL ) 10 MG capsule Take 1 capsule (10 mg total) by mouth 4 (four) times daily -  before meals and at bedtime. 90 capsule 0   famotidine  (PEPCID ) 20 MG tablet TAKE ONE TABLET TWICE DAILY 180 tablet 0   polyethylene glycol powder (GLYCOLAX /MIRALAX ) 17 GM/SCOOP powder Take 8.5 g by mouth daily.     potassium chloride  (KLOR-CON ) 10 MEQ tablet TAKE TWO TABLETS TWICE DAILY 120 tablet 1   sildenafil  (VIAGRA ) 50 MG tablet Take 1 tablet (50 mg total) by mouth daily as needed for erectile dysfunction. 10 tablet 0   No current facility-administered medications for this visit.    ***REVIEW OF SYSTEMS (negative unless checked):   Cardiac:  []  Chest pain or chest pressure? []  Shortness of breath upon activity? []  Shortness of breath when lying flat? []  Irregular heart rhythm?  Vascular:  []  Pain in calf, thigh, or hip brought on by walking? []  Pain in feet at night that wakes you up from your sleep? []  Blood clot in your veins? []  Leg swelling?  Pulmonary:  []  Oxygen at home? []  Productive cough? []   Wheezing?  Neurologic:  []  Sudden weakness in arms or legs? []  Sudden numbness in arms or legs? []  Sudden onset of difficult speaking or slurred speech? []  Temporary loss of vision in one eye? []  Problems with dizziness?  Gastrointestinal:  []  Blood in stool? []  Vomited blood?  Genitourinary:  []  Burning when urinating? []  Blood in urine?  Psychiatric:  []  Major depression  Hematologic:  []  Bleeding problems? []  Problems with blood clotting?  Dermatologic:  []  Rashes or ulcers?  Constitutional:  []  Fever or chills?  Ear/Nose/Throat:  []  Change in hearing? []  Nose bleeds? []  Sore throat?  Musculoskeletal:  []  Back pain? []  Joint pain? []  Muscle pain?   Physical Examination   Vitals:   04/30/24 1029  BP: (!) 154/88  Pulse: 62  Temp: 98.5 F (36.9 C)  TempSrc: Temporal  Weight: 163 lb (73.9 kg)  Height: 5' 6 (1.676 m)   Body mass index is 26.31 kg/m.  General:  WDWN in NAD; vital signs documented above Gait: Not observed HENT: WNL, normocephalic Pulmonary: normal non-labored breathing  Cardiac: regular Abdomen: soft, NT, no masses Skin: without rashes Vascular Exam/Pulses: Palpable/nonpalpable femoral pulses, palpable/nonpalpable popliteal pulses, palpable/nonpalpable pedal pulses. Left DP/PT/Peroneal doppler signals. Right DP/PT/Peroneal doppler signals Extremities: without ischemic changes, without gangrene , without cellulitis; without open wounds;  Musculoskeletal: no muscle wasting or atrophy  Neurologic: A&O X 3;  No focal weakness or paresthesias  are detected Psychiatric:  The pt has Normal affect.  Non-Invasive Vascular imaging   ABI (04/30/2024) R:  ABI: 0.37 (0.58),  PT: none DP: mono TBI:  0 L:  ABI: 0.49 (0.53),  PT: none DP: mono TBI: 0  Aortoiliac Duplex (04/30/2024)   Mesenteric Duplex (04/30/2024)   Medical Decision Making   Cameron Ware is a 63 y.o. male who presents for follow up  Based on the patient's  vascular studies, their ABIs are essentially unchanged since last visit. *** Arterial duplex *** The patient denies any claudication, rest pain, or tissue loss. They have palpable/nonpalpable pedal pulses with *** doppler signals They will continue their *** and follow up with our office in *** months/year with ABIs and ***   Ahmed Holster PA-C [facility] Office: [phone]  Clinic MD: [name]

## 2024-05-07 ENCOUNTER — Telehealth (INDEPENDENT_AMBULATORY_CARE_PROVIDER_SITE_OTHER): Payer: Self-pay | Admitting: Gastroenterology

## 2024-05-07 NOTE — Telephone Encounter (Signed)
 Voicemail left stating patient medication was not working.  Returned call to patient. Pt states he has taken 9 boxes of IB Guard and it has not helped. Is still taking Bentyl  2-3 times a day. Pt states he needs to see somebody or something because he is still hurting. Advised pt he was due for office visit in Jan/Feb. Pt states he is not sure he can wait until then. [facility] has opening in the morning at 8:15 and 8:45; offered pt those times and pt took 8:45 appointment for 05/08/24.

## 2024-05-07 NOTE — Telephone Encounter (Signed)
 Error

## 2024-05-08 ENCOUNTER — Ambulatory Visit (INDEPENDENT_AMBULATORY_CARE_PROVIDER_SITE_OTHER): Admitting: Gastroenterology

## 2024-05-08 ENCOUNTER — Encounter (INDEPENDENT_AMBULATORY_CARE_PROVIDER_SITE_OTHER): Payer: Self-pay | Admitting: Gastroenterology

## 2024-05-08 VITALS — BP 128/74 | HR 67 | Temp 97.8°F | Ht 65.0 in | Wt 165.9 lb

## 2024-05-08 DIAGNOSIS — R1030 Lower abdominal pain, unspecified: Secondary | ICD-10-CM | POA: Diagnosis not present

## 2024-05-08 MED ORDER — AMITRIPTYLINE HCL 10 MG PO TABS: 10.0000 mg | ORAL_TABLET | Freq: Every day | ORAL | 1 refills | Status: AC

## 2024-05-08 NOTE — Progress Notes (Signed)
 Referring Provider: [name], FNP Primary Care Physician:  [name], FNP Primary GI Physician: Dr. Cinderella   Chief Complaint  Patient presents with   Abdominal Pain    States that the IbGuard and dicyclomine  doesn't help, not taking either anymore.   HPI:   Cameron Ware is a 63 y.o. male with past medical history of arthritis, depression, high cholesterol, HTN, PVD, stroke , chronic mesenteric ischemia status post stent placement 09/2023   Patient presenting today for:  Follow up of abdominal pain  Last seen August, at that time having lower abdominal pain, changes with movement, regular BMs. Taking meloxicam  and still smoking   Recommended avoiding NSAIDs, smoking cessation, follow up with vascular surgery, IR, start bentyl  and IBgard, PPI daily   Seen by vascular PA on 12/3 with ongoing lower abdominal pain, no post prandial worsening of pain, denies improvement after stent placement, recommended continue to follow with GI, repeat ABI, mesenteric duplex and aortoiliac duplex in 9 months  Present: He has continued lower abdominal pain that is constant. He denies any changes of pain with eating or defecation. States pain is a pulling sensation, worse when he moves or bends over. He was taking IBgard, noting he took multiple boxes of this. He also tried bentyl  without any improvement. He has actually gained some weight but feels appetite is not great. Denies constipation, very occasional diarrhea if he eats something he shouldn't. No rectal bleeding or melena. Will take ibuprofen on occasion but tries to avoid taking these unless he absolutely has to.    CT angio A/P w wo: 08/2023 1. Celiac artery: Moderate to severe stenosis in the proximal segment estimated greater than 75%. 2. SMA: Moderate stenosis in the proximal segment estimated at 60%. 3. Right main renal artery: Moderate stenosis in the proximal segment estimated at 50%. 4. Right iliac system: Patent stent  material. Moderate stenosis in the distal right external iliac artery. Moderate common femoral disease. 5. Left iliac system: Patent stent material. Moderate disease in the distal left external iliac artery. Severe left common femoral disease.  Last Colonoscopy: 12/2022- Diverticulosis in the left colon.                           - One 7 mm polyp in the sigmoid colon, removed with                            a cold snare. Resected and retrieved.                           - Non-bleeding external and internal hemorrhoids. (1 TA)  Recommendations:  Repeat colonoscopy 5 years   Filed Weights   05/08/24 0829  Weight: 165 lb 14.4 oz (75.3 kg)     Past Medical History:  Diagnosis Date   Anorexia 11/29/2010   Arthritis    Decreased vision    Depression    Dyspnea on exertion    Generalized headaches    High cholesterol 01/30/2017   Hypertension    Peripheral vascular disease    Stroke [facility])     Past Surgical History:  Procedure Laterality Date   ABDOMINAL AORTAGRAM N/A 08/22/2011   Procedure: ABDOMINAL EZELLA;  Surgeon: Gaile LELON New, MD;  Location: [facility] CATH LAB;  Service: Cardiovascular;  Laterality: N/A;   aortogram     COLONOSCOPY  N/A 06/07/2017   Procedure: COLONOSCOPY;  Surgeon: [name], MD;  Location: [facility] ENDO SUITE;  Service: Endoscopy;  Laterality: N/A;  150   COLONOSCOPY WITH PROPOFOL  N/A 01/18/2023   Procedure: COLONOSCOPY WITH PROPOFOL ;  Surgeon: [name], MD;  Location: [facility] ENDO SUITE;  Service: Endoscopy;  Laterality: N/A;  1:00pm;asa 3   IR RADIOLOGIST EVAL & MGMT  02/02/2023   PERCUTANEOUS STENT INTERVENTION Bilateral 08/22/2011   Procedure: PERCUTANEOUS STENT INTERVENTION;  Surgeon: Gaile LELON New, MD;  Location: [facility] CATH LAB;  Service: Cardiovascular;  Laterality: Bilateral;   POLYPECTOMY  01/18/2023   Procedure: POLYPECTOMY;  Surgeon: [name], MD;  Location: [facility] ENDO SUITE;  Service: Endoscopy;;   Stents  August 22, 2011   Bilateral iliac  PTA and stenting   VISCERAL ANGIOGRAPHY N/A 10/08/2023   Procedure: VISCERAL ANGIOGRAPHY;  Surgeon: [name], MD;  Location: [facility] INVASIVE CV LAB;  Service: Cardiovascular;  Laterality: N/A;   VISCERAL ARTERY INTERVENTION N/A 10/08/2023   Procedure: VISCERAL ARTERY INTERVENTION;  Surgeon: [name], MD;  Location: [facility] INVASIVE CV LAB;  Service: Cardiovascular;  Laterality: N/A;    Current Outpatient Medications  Medication Sig Dispense Refill   aspirin  EC 81 MG tablet Take 81 mg by mouth daily. Swallow whole.     atorvastatin  (LIPITOR) 80 MG tablet Take 1 tablet (80 mg total) by mouth daily. 90 tablet 1   clopidogrel  (PLAVIX ) 75 MG tablet TAKE ONE TABLET DAILY 30 tablet 5   desvenlafaxine  (PRISTIQ ) 25 MG 24 hr tablet Take 1 tablet (25 mg total) by mouth daily. 90 tablet 1   famotidine  (PEPCID ) 20 MG tablet TAKE ONE TABLET TWICE DAILY 180 tablet 0   pantoprazole  (PROTONIX ) 40 MG tablet Take 40 mg by mouth daily.     polyethylene glycol powder (GLYCOLAX /MIRALAX ) 17 GM/SCOOP powder Take 8.5 g by mouth daily.     potassium chloride  (KLOR-CON ) 10 MEQ tablet TAKE TWO TABLETS TWICE DAILY 120 tablet 1   sildenafil  (VIAGRA ) 50 MG tablet Take 1 tablet (50 mg total) by mouth daily as needed for erectile dysfunction. 10 tablet 0   No current facility-administered medications for this visit.    Allergies as of 05/08/2024   (No Known Allergies)    Social History   Socioeconomic History   Marital status: Single    Spouse name: Not on file   Number of children: Not on file   Years of education: Not on file   Highest education level: Not on file  Occupational History   Not on file  Tobacco Use   Smoking status: Some Days    Current packs/day: 2.00    Average packs/day: 1 pack/day for 51.8 years (52.2 ttl pk-yrs)    Types: Cigarettes    Start date: 08/04/1972   Smokeless tobacco: Never   Tobacco comments:    quit smoking 5 yrs ago. smoked one cigarette yesterday  (09/25/2018)  Vaping Use   Vaping status: Former  Substance and Sexual Activity   Alcohol use: Yes    Comment: wine every other day; 2-3 a day on 02/20/2019   Drug use: Yes    Frequency: 3.0 times per week    Types: Marijuana   Sexual activity: Not on file  Other Topics Concern   Not on file  Social History Narrative   Lives with mother, Lynder, who is currently under Hospice care-06/08/2021.   Brother and sister in law live nearby and helps with pt's mom.   Social  Drivers of Health   Tobacco Use: High Risk (05/08/2024)   Patient History    Smoking Tobacco Use: Some Days    Smokeless Tobacco Use: Never    Passive Exposure: Not on file  Financial Resource Strain: Low Risk (06/14/2023)   Overall Financial Resource Strain (CARDIA)    Difficulty of Paying Living Expenses: Not hard at all  Food Insecurity: No Food Insecurity (06/14/2023)   Hunger Vital Sign    Worried About Running Out of Food in the Last Year: Never true    Ran Out of Food in the Last Year: Never true  Transportation Needs: No Transportation Needs (06/14/2023)   PRAPARE - Administrator, Civil Service (Medical): No    Lack of Transportation (Non-Medical): No  Physical Activity: Inactive (06/14/2023)   Exercise Vital Sign    Days of Exercise per Week: 0 days    Minutes of Exercise per Session: 0 min  Stress: No Stress Concern Present (06/14/2023)   [employer] of Occupational Health - Occupational Stress Questionnaire    Feeling of Stress : Only a little  Social Connections: Moderately Isolated (06/14/2023)   Social Connection and Isolation Panel    Frequency of Communication with Friends and Family: More than three times a week    Frequency of Social Gatherings with Friends and Family: Three times a week    Attends Religious Services: 1 to 4 times per year    Active Member of Clubs or Organizations: No    Attends Banker Meetings: Never    Marital Status: Never married  Depression  (PHQ2-9): High Risk (02/12/2024)   Depression (PHQ2-9)    PHQ-2 Score: 18  Alcohol Screen: Low Risk (06/14/2023)   Alcohol Screen    Last Alcohol Screening Score (AUDIT): 0  Housing: Unknown (06/14/2023)   Housing Stability Vital Sign    Unable to Pay for Housing in the Last Year: No    Number of Times Moved in the Last Year: Not on file    Homeless in the Last Year: No  Utilities: Not At Risk (06/14/2023)   AHC Utilities    Threatened with loss of utilities: No  Health Literacy: Adequate Health Literacy (06/14/2023)   B1300 Health Literacy    Frequency of need for help with medical instructions: Never    Review of systems General: negative for malaise, night sweats, fever, chills, weight loss Neck: Negative for lumps, goiter, pain and significant neck swelling Resp: Negative for cough, wheezing, dyspnea at rest CV: Negative for chest pain, leg swelling, palpitations, orthopnea GI: denies melena, hematochezia, nausea, vomiting, diarrhea, constipation, dysphagia, odyonophagia, early satiety or unintentional weight loss. +lower abdominal pain  MSK: Negative for joint pain or swelling, back pain, and muscle pain. Derm: Negative for itching or rash Psych: Denies depression, anxiety, memory loss, confusion. No homicidal or suicidal ideation.  Heme: Negative for prolonged bleeding, bruising easily, and swollen nodes. Endocrine: Negative for cold or heat intolerance, polyuria, polydipsia and goiter. Neuro: negative for tremor, gait imbalance, syncope and seizures. The remainder of the review of systems is noncontributory.  Physical Exam: BP 128/74   Pulse 67   Temp 97.8 F (36.6 C) (Oral)   Ht 5' 5 (1.651 m)   Wt 165 lb 14.4 oz (75.3 kg)   SpO2 97%   BMI 27.61 kg/m  General:   Alert and oriented. No distress noted. Pleasant and cooperative.  Head:  Normocephalic and atraumatic. Eyes:  Conjuctiva clear without scleral icterus. Mouth:  Oral  mucosa pink and moist. Good dentition. No  lesions. Heart: Normal rate and rhythm, s1 and s2 heart sounds present.  Lungs: Clear lung sounds in all lobes. Respirations equal and unlabored. Abdomen:  +BS, soft, non-tender and non-distended. No rebound or guarding. No HSM or masses noted. Derm: No palmar erythema or jaundice Msk:  Symmetrical without gross deformities. Normal posture. Extremities:  Without edema. Neurologic:  Alert and  oriented x4 Psych:  Alert and cooperative. Normal mood and affect.  Invalid input(s): 6 MONTHS   ASSESSMENT: IZAAH WESTMAN is a 63 y.o. male presenting today for follow up of abdominal pain   Patient with ongoing, chronic lower abdominal pain. Only real precipitating factors are movement. May be musculoskeletal in nature. He has no pain radiation, no urinary symptoms, no back pain.  He has not had improvement with bentyl , IBgard, OTC analgesics. Recent colonoscopy without findings to explain pain. 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 He has no post prandial pain, no weight loss or rectal bleeding. Query some aspect of functional pain, which I discussed in depth with the patient and his wife. I am recommending a trial of low dose elavil to see if this improves his symptoms. I encouraged him to allow about 4 weeks to see any improvement from TCA therapy. He is agreeable to try TCA.   PLAN:  -continue to follow with vascular surgery -start elavil 10mg  at bedtime  All questions were answered, patient verbalized understanding and is in agreement with plan as outlined above.   Follow Up: 2 months   Aili Casillas L. Chang Tiggs, MSN, APRN, AGNP-C Adult-Gerontology Nurse Practitioner [facility]

## 2024-05-08 NOTE — Patient Instructions (Signed)
 We will start with a very low dose of elavil to see if this helps your pain Please take this at night prior to bed Please allow about 4 weeks to see any Improvement  Follow up 2 months

## 2024-05-13 ENCOUNTER — Other Ambulatory Visit: Payer: Self-pay | Admitting: Family Medicine

## 2024-05-14 ENCOUNTER — Encounter: Payer: Self-pay | Admitting: Family Medicine

## 2024-05-14 ENCOUNTER — Ambulatory Visit: Payer: Self-pay | Admitting: Family Medicine

## 2024-05-14 VITALS — BP 160/82 | HR 78 | Temp 97.7°F | Ht 65.0 in | Wt 165.6 lb

## 2024-05-14 DIAGNOSIS — F331 Major depressive disorder, recurrent, moderate: Secondary | ICD-10-CM | POA: Diagnosis not present

## 2024-05-14 DIAGNOSIS — K219 Gastro-esophageal reflux disease without esophagitis: Secondary | ICD-10-CM

## 2024-05-14 DIAGNOSIS — E782 Mixed hyperlipidemia: Secondary | ICD-10-CM

## 2024-05-14 DIAGNOSIS — R109 Unspecified abdominal pain: Secondary | ICD-10-CM

## 2024-05-14 DIAGNOSIS — G8929 Other chronic pain: Secondary | ICD-10-CM | POA: Diagnosis not present

## 2024-05-14 DIAGNOSIS — F1011 Alcohol abuse, in remission: Secondary | ICD-10-CM | POA: Diagnosis not present

## 2024-05-14 DIAGNOSIS — I774 Celiac artery compression syndrome: Secondary | ICD-10-CM | POA: Diagnosis not present

## 2024-05-14 DIAGNOSIS — I739 Peripheral vascular disease, unspecified: Secondary | ICD-10-CM

## 2024-05-14 DIAGNOSIS — I70213 Atherosclerosis of native arteries of extremities with intermittent claudication, bilateral legs: Secondary | ICD-10-CM | POA: Diagnosis not present

## 2024-05-14 DIAGNOSIS — F411 Generalized anxiety disorder: Secondary | ICD-10-CM | POA: Diagnosis not present

## 2024-05-14 DIAGNOSIS — I10 Essential (primary) hypertension: Secondary | ICD-10-CM | POA: Diagnosis not present

## 2024-05-14 DIAGNOSIS — K551 Chronic vascular disorders of intestine: Secondary | ICD-10-CM

## 2024-05-14 MED ORDER — ATORVASTATIN CALCIUM 80 MG PO TABS: 80.0000 mg | ORAL_TABLET | Freq: Every day | ORAL | 1 refills | Status: AC

## 2024-05-14 MED ORDER — DESVENLAFAXINE SUCCINATE ER 25 MG PO TB24: 25.0000 mg | ORAL_TABLET | Freq: Every day | ORAL | 1 refills | Status: AC

## 2024-05-14 MED ORDER — FAMOTIDINE 20 MG PO TABS: 20.0000 mg | ORAL_TABLET | Freq: Two times a day (BID) | ORAL | 1 refills | Status: AC

## 2024-05-14 MED ORDER — CLOPIDOGREL BISULFATE 75 MG PO TABS: 75.0000 mg | ORAL_TABLET | Freq: Every day | ORAL | 5 refills | Status: AC

## 2024-05-14 MED ORDER — LISINOPRIL 10 MG PO TABS: 10.0000 mg | ORAL_TABLET | Freq: Every day | ORAL | 3 refills | Status: AC

## 2024-05-14 NOTE — Progress Notes (Addendum)
 "    Subjective:  [name], male    DOB: 25-Aug-1960, 63 y.o.   MRN: [id number]  Patient Care Team: [name], FNP as PCP - [facility] ([facility]) [facility], [facility] Askew, MD as Referring Physician (Cardiology)   Chief Complaint:  Medical Management of Chronic Issues (6 month follow up )   HPI: Cameron Ware is a 63 y.o. male presenting on 05/14/2024 for Medical Management of Chronic Issues (6 month follow up )   Cameron Ware is a 63 year old male with hypertension and coronary artery disease who presents with elevated blood pressure and abdominal pain.  His blood pressure has been consistently high, with recent readings of 154/88 and 145/85. He took his morning medication approximately 30 minutes before the visit. He experiences headaches but no chest pain or leg swelling.  He has a history of abdominal pain and suspects a possible issue with tissue around his stents. He feels a tearing sensation in his abdomen, especially when lifting objects, but imaging studies, including a CT angiography and a duplex ultrasound, have shown patent stents with no surrounding inflammation. He takes dicyclomine  (Bentyl ) for abdominal discomfort, which provides minimal relief. He experiences discomfort during activities like taking out the trash or mowing the lawn, feeling as though he pulls something in his stomach.  He has a history of vascular issues and has consulted with a vascular surgeon, Dr. [name], with the last visit in May. He is on blood thinners, including Plavix , and cholesterol medication.  He also takes Pristiq  for anxiety and depression, Pepcid  and Protonix  for acid reflux, and amitriptyline  for sleep and stomach pain.  He has abstained from alcohol for a significant period and has reduced smoking.     He is followed by GI for his ongoing abdominal issues.       06/16/2024    9:19 AM 02/12/2024    4:33 PM 11/09/2023    10:39 AM 08/09/2023   10:26 AM 06/14/2023    9:06 AM  Depression screen PHQ 2/9  Decreased Interest 0 3 1 3  0  Down, Depressed, Hopeless 1 2 1 3  0  PHQ - 2 Score 1 5 2 6  0  Altered sleeping  2 0 2   Tired, decreased energy  3 2 0   Change in appetite  3 0 0   Feeling bad or failure about yourself   2 0 0   Trouble concentrating  3 0 0   Moving slowly or fidgety/restless  0 0 0   Suicidal thoughts  0 0 0   PHQ-9 Score  18  4  8     Difficult doing work/chores  Somewhat difficult  Not difficult at all      Data saved with a previous flowsheet row definition      02/12/2024    4:33 PM 11/09/2023   10:44 AM 08/09/2023   10:27 AM 05/02/2022   10:46 AM  GAD 7 : Generalized Anxiety Score  Nervous, Anxious, on Edge 3  3  0  0   Control/stop worrying 3  0  0  0   Worry too much - different things 3  2  3   0   Trouble relaxing 3  0  3  0   Restless 3  0  3  0   Easily annoyed or irritable 2  2  3   0   Afraid - awful might happen 0  0  0  0   Total GAD 7 Score 17 7 12  0  Anxiety Difficulty Somewhat difficult  Not difficult at all      Data saved with a previous flowsheet row definition    Declined to complete GAD and PHQ screenings today.   Relevant past medical, surgical, family, and social history reviewed and updated as indicated.  Allergies and medications reviewed and updated. Data reviewed: Chart in Epic.   Past Medical History:  Diagnosis Date   Anorexia 11/29/2010   Arthritis    Decreased vision    Depression    Dyspnea on exertion    Generalized headaches    High cholesterol 01/30/2017   Hypertension    Peripheral vascular disease    Stroke [facility])     Past Surgical History:  Procedure Laterality Date   ABDOMINAL AORTAGRAM N/A 08/22/2011   Procedure: ABDOMINAL EZELLA;  Surgeon: Gaile LELON New, MD;  Location: [facility] CATH LAB;  Service: Cardiovascular;  Laterality: N/A;   aortogram     COLONOSCOPY N/A 06/07/2017   Procedure: COLONOSCOPY;  Surgeon: [name],  MD;  Location: [facility] ENDO SUITE;  Service: Endoscopy;  Laterality: N/A;  150   COLONOSCOPY WITH PROPOFOL  N/A 01/18/2023   Procedure: COLONOSCOPY WITH PROPOFOL ;  Surgeon: [name], MD;  Location: [facility] ENDO SUITE;  Service: Endoscopy;  Laterality: N/A;  1:00pm;asa 3   IR RADIOLOGIST EVAL & MGMT  02/02/2023   PERCUTANEOUS STENT INTERVENTION Bilateral 08/22/2011   Procedure: PERCUTANEOUS STENT INTERVENTION;  Surgeon: Gaile LELON New, MD;  Location: [facility] CATH LAB;  Service: Cardiovascular;  Laterality: Bilateral;   POLYPECTOMY  01/18/2023   Procedure: POLYPECTOMY;  Surgeon: [name], MD;  Location: [facility] ENDO SUITE;  Service: Endoscopy;;   Stents  August 22, 2011   Bilateral iliac PTA and stenting   VISCERAL ANGIOGRAPHY N/A 10/08/2023   Procedure: VISCERAL ANGIOGRAPHY;  Surgeon: [name], MD;  Location: [facility] INVASIVE CV LAB;  Service: Cardiovascular;  Laterality: N/A;   VISCERAL ARTERY INTERVENTION N/A 10/08/2023   Procedure: VISCERAL ARTERY INTERVENTION;  Surgeon: [name], MD;  Location: [facility] INVASIVE CV LAB;  Service: Cardiovascular;  Laterality: N/A;    Social History   Socioeconomic History   Marital status: Single    Spouse name: Not on file   Number of children: Not on file   Years of education: Not on file   Highest education level: Not on file  Occupational History   Not on file  Tobacco Use   Smoking status: Some Days    Current packs/day: 2.00    Average packs/day: 1 pack/day for 51.9 years (52.5 ttl pk-yrs)    Types: Cigarettes    Start date: 08/04/1972   Smokeless tobacco: Never   Tobacco comments:    quit smoking 5 yrs ago. smoked one cigarette yesterday (09/25/2018)  Vaping Use   Vaping status: Former  Substance and Sexual Activity   Alcohol use: Yes    Comment: wine every other day; 2-3 a day on 02/20/2019   Drug use: Yes    Frequency: 3.0 times per week    Types: Marijuana   Sexual activity: Not on file  Other Topics Concern   Not on  file  Social History Narrative   Lives with mother, Lynder, who is currently under Hospice care-06/08/2021.   Brother and sister in law live nearby and helps with pt's mom.   Social Drivers of Health   Tobacco Use: High Risk (06/16/2024)   Patient History  Smoking Tobacco Use: Some Days    Smokeless Tobacco Use: Never    Passive Exposure: Not on file  Financial Resource Strain: Low Risk (06/14/2023)   Overall Financial Resource Strain (CARDIA)    Difficulty of Paying Living Expenses: Not hard at all  Food Insecurity: No Food Insecurity (06/16/2024)   Epic    Worried About Programme Researcher, Broadcasting/film/video in the Last Year: Never true    Ran Out of Food in the Last Year: Never true  Transportation Needs: No Transportation Needs (06/16/2024)   Epic    Lack of Transportation (Medical): No    Lack of Transportation (Non-Medical): No  Physical Activity: Inactive (06/16/2024)   Exercise Vital Sign    Days of Exercise per Week: 0 days    Minutes of Exercise per Session: 0 min  Stress: No Stress Concern Present (06/16/2024)   [employer] of Occupational Health - Occupational Stress Questionnaire    Feeling of Stress: Not at all  Social Connections: Moderately Isolated (06/16/2024)   Social Connection and Isolation Panel    Frequency of Communication with Friends and Family: More than three times a week    Frequency of Social Gatherings with Friends and Family: Three times a week    Attends Religious Services: 1 to 4 times per year    Active Member of Clubs or Organizations: No    Attends Banker Meetings: Never    Marital Status: Never married  Intimate Partner Violence: Not At Risk (06/16/2024)   Epic    Fear of Current or Ex-Partner: No    Emotionally Abused: No    Physically Abused: No    Sexually Abused: No  Depression (PHQ2-9): Low Risk (06/16/2024)   Depression (PHQ2-9)    PHQ-2 Score: 1  Alcohol Screen: Low Risk (06/14/2023)   Alcohol Screen    Last Alcohol Screening Score  (AUDIT): 0  Housing: Unknown (06/16/2024)   Epic    Unable to Pay for Housing in the Last Year: No    Number of Times Moved in the Last Year: Not on file    Homeless in the Last Year: No  Utilities: Not At Risk (06/16/2024)   Epic    Threatened with loss of utilities: No  Health Literacy: Adequate Health Literacy (06/16/2024)   B1300 Health Literacy    Frequency of need for help with medical instructions: Never    Outpatient Encounter Medications as of 05/14/2024  Medication Sig   amitriptyline  (ELAVIL ) 10 MG tablet Take 1 tablet (10 mg total) by mouth at bedtime.   aspirin  EC 81 MG tablet Take 81 mg by mouth daily. Swallow whole.   famotidine  (PEPCID ) 20 MG tablet TAKE ONE TABLET TWICE DAILY   lisinopril  (ZESTRIL ) 10 MG tablet Take 1 tablet (10 mg total) by mouth daily.   pantoprazole  (PROTONIX ) 40 MG tablet Take 40 mg by mouth daily.   polyethylene glycol powder (GLYCOLAX /MIRALAX ) 17 GM/SCOOP powder Take 8.5 g by mouth daily.   sildenafil  (VIAGRA ) 50 MG tablet Take 1 tablet (50 mg total) by mouth daily as needed for erectile dysfunction.   [DISCONTINUED] potassium chloride  (KLOR-CON ) 10 MEQ tablet TAKE TWO TABLETS TWICE DAILY   atorvastatin  (LIPITOR) 80 MG tablet Take 1 tablet (80 mg total) by mouth daily.   clopidogrel  (PLAVIX ) 75 MG tablet Take 1 tablet (75 mg total) by mouth daily.   desvenlafaxine  (PRISTIQ ) 25 MG 24 hr tablet Take 1 tablet (25 mg total) by mouth daily.   famotidine  (PEPCID ) 20 MG tablet  Take 1 tablet (20 mg total) by mouth 2 (two) times daily.   [DISCONTINUED] atorvastatin  (LIPITOR) 80 MG tablet Take 1 tablet (80 mg total) by mouth daily.   [DISCONTINUED] clopidogrel  (PLAVIX ) 75 MG tablet TAKE ONE TABLET DAILY   [DISCONTINUED] desvenlafaxine  (PRISTIQ ) 25 MG 24 hr tablet Take 1 tablet (25 mg total) by mouth daily.   [DISCONTINUED] famotidine  (PEPCID ) 20 MG tablet TAKE ONE TABLET TWICE DAILY   [DISCONTINUED] pravastatin (PRAVACHOL) 40 MG tablet Take 40 mg by mouth  daily.     No facility-administered encounter medications on file as of 05/14/2024.    Allergies[1]  Pertinent ROS per HPI, otherwise unremarkable      Objective:  BP (!) 160/82   Pulse 78   Temp 97.7 F (36.5 C)   Ht 5' 5 (1.651 m)   Wt 165 lb 9.6 oz (75.1 kg)   SpO2 98%   BMI 27.56 kg/m    Wt Readings from Last 3 Encounters:  06/16/24 165 lb (74.8 kg)  05/14/24 165 lb 9.6 oz (75.1 kg)  05/08/24 165 lb 14.4 oz (75.3 kg)    Physical Exam Vitals and nursing note reviewed.  Constitutional:      General: He is not in acute distress.    Appearance: Normal appearance. He is not ill-appearing, toxic-appearing or diaphoretic.  HENT:     Head: Normocephalic and atraumatic.     Nose: Nose normal.     Mouth/Throat:     Mouth: Mucous membranes are moist.  Eyes:     Conjunctiva/sclera: Conjunctivae normal.     Pupils: Pupils are equal, round, and reactive to light.  Neck:     Vascular: Carotid bruit present.  Cardiovascular:     Rate and Rhythm: Normal rate and regular rhythm.     Heart sounds: Normal heart sounds.  Pulmonary:     Effort: Pulmonary effort is normal.     Breath sounds: Normal breath sounds.  Musculoskeletal:     Cervical back: Neck supple.     Right lower leg: No edema.     Left lower leg: No edema.  Skin:    General: Skin is warm and dry.     Capillary Refill: Capillary refill takes less than 2 seconds.  Neurological:     General: No focal deficit present.     Mental Status: He is alert and oriented to person, place, and time.  Psychiatric:        Mood and Affect: Mood normal.        Behavior: Behavior normal.        Thought Content: Thought content normal.        Judgment: Judgment normal.      Results for orders placed or performed in visit on 05/14/24  CMP14+EGFR   Collection Time: 05/14/24 11:19 AM  Result Value Ref Range   Glucose 85 70 - 99 mg/dL   BUN 22 8 - 27 mg/dL   Creatinine, Ser 9.05 0.76 - 1.27 mg/dL   eGFR 90 >40  fO/fpw/8.26   BUN/Creatinine Ratio 23 10 - 24   Sodium 143 134 - 144 mmol/L   Potassium 4.8 3.5 - 5.2 mmol/L   Chloride 103 96 - 106 mmol/L   CO2 24 20 - 29 mmol/L   Calcium  10.3 (H) 8.6 - 10.2 mg/dL   Total Protein 6.9 6.0 - 8.5 g/dL   Albumin 4.4 3.9 - 4.9 g/dL   Globulin, Total 2.5 1.5 - 4.5 g/dL   Bilirubin Total 0.5 0.0 -  1.2 mg/dL   Alkaline Phosphatase 147 (H) 47 - 123 IU/L   AST 22 0 - 40 IU/L   ALT 28 0 - 44 IU/L  CBC with Differential/Platelet   Collection Time: 05/14/24 11:19 AM  Result Value Ref Range   WBC 9.4 3.4 - 10.8 x10E3/uL   RBC 5.21 4.14 - 5.80 x10E6/uL   Hemoglobin 16.0 13.0 - 17.7 g/dL   Hematocrit 52.6 62.4 - 51.0 %   MCV 91 79 - 97 fL   MCH 30.7 26.6 - 33.0 pg   MCHC 33.8 31.5 - 35.7 g/dL   RDW 87.5 88.3 - 84.5 %   Platelets 285 150 - 450 x10E3/uL   Neutrophils 59 Not Estab. %   Lymphs 23 Not Estab. %   Monocytes 13 Not Estab. %   Eos 4 Not Estab. %   Basos 1 Not Estab. %   Neutrophils Absolute 5.6 1.4 - 7.0 x10E3/uL   Lymphocytes Absolute 2.2 0.7 - 3.1 x10E3/uL   Monocytes Absolute 1.2 (H) 0.1 - 0.9 x10E3/uL   EOS (ABSOLUTE) 0.3 0.0 - 0.4 x10E3/uL   Basophils Absolute 0.1 0.0 - 0.2 x10E3/uL   Immature Granulocytes 0 Not Estab. %   Immature Grans (Abs) 0.0 0.0 - 0.1 x10E3/uL  Lipid panel   Collection Time: 05/14/24 11:19 AM  Result Value Ref Range   Cholesterol, Total 122 100 - 199 mg/dL   Triglycerides 824 (H) 0 - 149 mg/dL   HDL 44 >60 mg/dL   VLDL Cholesterol Cal 29 5 - 40 mg/dL   LDL Chol Calc (NIH) 49 0 - 99 mg/dL   Chol/HDL Ratio 2.8 0.0 - 5.0 ratio  TSH   Collection Time: 05/14/24 11:19 AM  Result Value Ref Range   TSH 3.400 0.450 - 4.500 uIU/mL  T4, Free   Collection Time: 05/14/24 11:19 AM  Result Value Ref Range   Free T4 0.94 0.82 - 1.77 ng/dL  Vitamin B12   Collection Time: 05/14/24 11:19 AM  Result Value Ref Range   Vitamin B-12 833 232 - 1,245 pg/mL  Folate   Collection Time: 05/14/24 11:19 AM  Result Value Ref Range    Folate >20.0 >3.0 ng/mL  Vitamin B1   Collection Time: 05/14/24 11:19 AM  Result Value Ref Range   Thiamine  207.9 (H) 66.5 - 200.0 nmol/L       Pertinent labs & imaging results that were available during my care of the patient were reviewed by me and considered in my medical decision making.  Assessment & Plan:  Gerrod was seen today for medical management of chronic issues.  Diagnoses and all orders for this visit:  Moderate episode of recurrent major depressive disorder (HCC) -     desvenlafaxine  (PRISTIQ ) 25 MG 24 hr tablet; Take 1 tablet (25 mg total) by mouth daily. -     CMP14+EGFR -     CBC with Differential/Platelet -     TSH -     T4, Free  Generalized anxiety disorder -     desvenlafaxine  (PRISTIQ ) 25 MG 24 hr tablet; Take 1 tablet (25 mg total) by mouth daily. -     CMP14+EGFR -     CBC with Differential/Platelet -     T4, Free  Mixed hyperlipidemia -     atorvastatin  (LIPITOR) 80 MG tablet; Take 1 tablet (80 mg total) by mouth daily. -     CMP14+EGFR -     Lipid panel  Atherosclerosis of native artery of both lower extremities  with intermittent claudication -     atorvastatin  (LIPITOR) 80 MG tablet; Take 1 tablet (80 mg total) by mouth daily. -     clopidogrel  (PLAVIX ) 75 MG tablet; Take 1 tablet (75 mg total) by mouth daily. -     lisinopril  (ZESTRIL ) 10 MG tablet; Take 1 tablet (10 mg total) by mouth daily. -     CMP14+EGFR -     CBC with Differential/Platelet -     Lipid panel  Mesenteric artery stenosis -     atorvastatin  (LIPITOR) 80 MG tablet; Take 1 tablet (80 mg total) by mouth daily. -     clopidogrel  (PLAVIX ) 75 MG tablet; Take 1 tablet (75 mg total) by mouth daily. -     CMP14+EGFR -     CBC with Differential/Platelet -     Lipid panel  PVD (peripheral vascular disease) -     atorvastatin  (LIPITOR) 80 MG tablet; Take 1 tablet (80 mg total) by mouth daily. -     clopidogrel  (PLAVIX ) 75 MG tablet; Take 1 tablet (75 mg total) by mouth daily. -      CMP14+EGFR -     CBC with Differential/Platelet -     Lipid panel  Stenosis of celiac artery -     atorvastatin  (LIPITOR) 80 MG tablet; Take 1 tablet (80 mg total) by mouth daily. -     clopidogrel  (PLAVIX ) 75 MG tablet; Take 1 tablet (75 mg total) by mouth daily. -     CMP14+EGFR -     CBC with Differential/Platelet -     Lipid panel  Primary hypertension -     lisinopril  (ZESTRIL ) 10 MG tablet; Take 1 tablet (10 mg total) by mouth daily. -     CMP14+EGFR -     CBC with Differential/Platelet -     Lipid panel -     TSH -     T4, Free  Gastroesophageal reflux disease without esophagitis -     famotidine  (PEPCID ) 20 MG tablet; Take 1 tablet (20 mg total) by mouth 2 (two) times daily. -     CMP14+EGFR -     CBC with Differential/Platelet  Alcohol abuse, in remission -     CMP14+EGFR -     Vitamin B12 -     Folate -     Vitamin B1  Chronic abdominal pain -     CMP14+EGFR -     Ambulatory referral to Pain Clinic      Primary hypertension Blood pressure readings have been consistently elevated, with recent measurements of 154/88 and 145/85, exceeding the target of 130/80. Elevated blood pressure poses a risk to stents and overall vascular health. - Started lisinopril  every morning for blood pressure control - Ordered blood work to update labs and check kidney function - Scheduled follow-up appointment in four weeks to recheck blood pressure and kidney function  Chronic abdominal pain Persistent abdominal pain with a sensation of tearing, possibly related to muscle strain or other non-vascular causes. Imaging studies, including CT angiography and duplex ultrasound, show patent stents and no surrounding inflammation. Pain may be exacerbated by physical activity. - Referred to PMR for evaluation and potential treatment options for abdominal pain  Mesenteric artery stenosis and peripheral vascular disease Recent imaging studies show patent stents and no surrounding  inflammation. No evidence of claudication or other vascular complications. Continued management with Plavix  and atorvastatin  is necessary to prevent clotting and plaque buildup. - Continue Plavix  and atorvastatin  as prescribed  Mixed  hyperlipidemia Continued management with atorvastatin  is necessary to prevent plaque buildup and maintain vascular health. - Continue atorvastatin  as prescribed  Gastroesophageal reflux disease Currently managed with Pepcid  and Protonix . Dietary modifications may help reduce symptoms. - Continue Pepcid  and Protonix  as prescribed - Advised dietary modifications to avoid fried, greasy, spicy, and fatty foods  Major depressive disorder and generalized anxiety disorder Currently managed with Pristiq . He reports adherence to medication regimen. - Continue Pristiq  as prescribed  Alcohol abuse, in remission He reports abstaining from alcohol for a significant period. - Continue to abstain from alcohol          Continue all other maintenance medications.  Follow up plan: Return in about 4 weeks (around 06/11/2024), or if symptoms worsen or fail to improve, for HTN.   Continue healthy lifestyle choices, including diet (rich in fruits, vegetables, and lean proteins, and low in salt and simple carbohydrates) and exercise (at least 30 minutes of moderate physical activity daily).  Educational handout given for DASH diet, HTN  The above assessment and management plan was discussed with the patient. The patient verbalized understanding of and has agreed to the management plan. Patient is aware to call the clinic if they develop any new symptoms or if symptoms persist or worsen. Patient is aware when to return to the clinic for a follow-up visit. Patient educated on when it is appropriate to go to the emergency department.   Rosaline Bruns, FNP-C [facility] [phone]     [1] No Known Allergies  "

## 2024-05-14 NOTE — Patient Instructions (Signed)
 Goal BP:  Less than 130/80  Take your medications faithfully as prescribed. Maintain a healthy weight. Get at least 150 minutes of aerobic exercise per week. Minimize salt intake, less than 2000 mg per day. Minimize alcohol intake.  DASH Eating Plan DASH stands for Dietary Approaches to Stop Hypertension. The DASH eating plan is a healthy eating plan that has been shown to reduce high blood pressure (hypertension). Additional health benefits may include reducing the risk of type 2 diabetes mellitus, heart disease, and stroke. The DASH eating plan may also help with weight loss.  WHAT DO I NEED TO KNOW ABOUT THE DASH EATING PLAN? For the DASH eating plan, you will follow these general guidelines: Choose foods with a percent daily value for sodium of less than 5% (as listed on the food label). Use salt-free seasonings or herbs instead of table salt or sea salt. Check with your health care provider or pharmacist before using salt substitutes. Eat lower-sodium products, often labeled as lower sodium or no salt added. Eat fresh foods. Eat more vegetables, fruits, and low-fat dairy products. Choose whole grains. Look for the word whole as the first word in the ingredient list. Choose fish and skinless chicken or [place] more often than red meat. Limit fish, poultry, and meat to 6 oz (170 g) each day. Limit sweets, desserts, sugars, and sugary drinks. Choose heart-healthy fats. Limit cheese to 1 oz (28 g) per day. Eat more home-cooked food and less restaurant, buffet, and fast food. Limit fried foods. Cook foods using methods other than frying. Limit canned vegetables. If you do use them, rinse them well to decrease the sodium. When eating at a restaurant, ask that your food be prepared with less salt, or no salt if possible.  WHAT FOODS CAN I EAT? Seek help from a dietitian for individual calorie needs.  Grains Whole grain or whole wheat bread. Hoshino rice. Whole grain or whole  wheat pasta. Quinoa, bulgur, and whole grain cereals. Low-sodium cereals. Corn or whole wheat flour tortillas. Whole grain cornbread. Whole grain crackers. Low-sodium crackers.  Vegetables Fresh or frozen vegetables (raw, steamed, roasted, or grilled). Low-sodium or reduced-sodium tomato and vegetable juices. Low-sodium or reduced-sodium tomato sauce and paste. Low-sodium or reduced-sodium canned vegetables.   Fruits All fresh, canned (in natural juice), or frozen fruits.  Meat and Other Protein Products Ground beef (85% or leaner), grass-fed beef, or beef trimmed of fat. Skinless chicken or [place]. Ground chicken or [place]. Pork trimmed of fat. All fish and seafood. Eggs. Dried beans, peas, or lentils. Unsalted nuts and seeds. Unsalted canned beans.  Dairy Low-fat dairy products, such as skim or 1% milk, 2% or reduced-fat cheeses, low-fat ricotta or cottage cheese, or plain low-fat yogurt. Low-sodium or reduced-sodium cheeses.  Fats and Oils Tub margarines without trans fats. Light or reduced-fat mayonnaise and salad dressings (reduced sodium). Avocado. Safflower, olive, or canola oils. Natural peanut or almond butter.  Other Unsalted popcorn and pretzels. The items listed above may not be a complete list of recommended foods or beverages. Contact your dietitian for more options.  WHAT FOODS ARE NOT RECOMMENDED?  Grains White bread. White pasta. White rice. Refined cornbread. Bagels and croissants. Crackers that contain trans fat.  Vegetables Creamed or fried vegetables. Vegetables in a cheese sauce. Regular canned vegetables. Regular canned tomato sauce and paste. Regular tomato and vegetable juices.  Fruits Dried fruits. Canned fruit in light or heavy syrup. Fruit juice.  Meat and Other Protein Products Fatty cuts of meat. Ribs,  chicken wings, bacon, sausage, bologna, salami, chitterlings, fatback, hot dogs, bratwurst, and packaged luncheon meats. Salted nuts and seeds. Canned  beans with salt.  Dairy Whole or 2% milk, cream, half-and-half, and cream cheese. Whole-fat or sweetened yogurt. Full-fat cheeses or blue cheese. Nondairy creamers and whipped toppings. Processed cheese, cheese spreads, or cheese curds.  Condiments Onion and garlic salt, seasoned salt, table salt, and sea salt. Canned and packaged gravies. Worcestershire sauce. Tartar sauce. Barbecue sauce. Teriyaki sauce. Soy sauce, including reduced sodium. Steak sauce. Fish sauce. Oyster sauce. Cocktail sauce. Horseradish. Ketchup and mustard. Meat flavorings and tenderizers. Bouillon cubes. Hot sauce. Tabasco sauce. Marinades. Taco seasonings. Relishes.  Fats and Oils Butter, stick margarine, lard, shortening, ghee, and bacon fat. Coconut, palm kernel, or palm oils. Regular salad dressings.  Other Pickles and olives. Salted popcorn and pretzels.  The items listed above may not be a complete list of foods and beverages to avoid. Contact your dietitian for more information.  WHERE CAN I FIND MORE INFORMATION? National [employer], [employer], [employer]: [email or web address] Document Released: 05/04/2011 Document Revised: 09/29/2013 Document Reviewed: 03/19/2013 [facility] Patient Information 2015 [place], [place]. This information is not intended to replace advice given to you by your health care provider. Make sure you discuss any questions you have with your health care provider.   I think that you would greatly benefit from seeing a nutritionist.  If you are interested, please call Dr. Wonda at [phone] to schedule an appointment.

## 2024-05-16 ENCOUNTER — Ambulatory Visit: Payer: Self-pay | Admitting: Family Medicine

## 2024-05-18 LAB — LIPID PANEL
Chol/HDL Ratio: 2.8 ratio (ref 0.0–5.0)
Cholesterol, Total: 122 mg/dL (ref 100–199)
HDL: 44 mg/dL (ref >39)
LDL Chol Calc (NIH): 49 mg/dL (ref 0–99)
Triglycerides: 175 mg/dL — ABNORMAL HIGH (ref 0–149)
VLDL Cholesterol Cal: 29 mg/dL (ref 5–40)

## 2024-05-18 LAB — CMP14+EGFR
ALT: 28 IU/L (ref 0–44)
AST: 22 IU/L (ref 0–40)
Albumin: 4.4 g/dL (ref 3.9–4.9)
Alkaline Phosphatase: 147 IU/L — ABNORMAL HIGH (ref 47–123)
BUN/Creatinine Ratio: 23 (ref 10–24)
BUN: 22 mg/dL (ref 8–27)
Bilirubin Total: 0.5 mg/dL (ref 0.0–1.2)
CO2: 24 mmol/L (ref 20–29)
Calcium: 10.3 mg/dL — ABNORMAL HIGH (ref 8.6–10.2)
Chloride: 103 mmol/L (ref 96–106)
Creatinine, Ser: 0.94 mg/dL (ref 0.76–1.27)
Globulin, Total: 2.5 g/dL (ref 1.5–4.5)
Glucose: 85 mg/dL (ref 70–99)
Potassium: 4.8 mmol/L (ref 3.5–5.2)
Sodium: 143 mmol/L (ref 134–144)
Total Protein: 6.9 g/dL (ref 6.0–8.5)
eGFR: 90 mL/min/1.73 (ref >59)

## 2024-05-18 LAB — CBC WITH DIFFERENTIAL/PLATELET
Basophils Absolute: 0.1 x10E3/uL (ref 0.0–0.2)
Basos: 1 %
EOS (ABSOLUTE): 0.3 x10E3/uL (ref 0.0–0.4)
Eos: 4 %
Hematocrit: 47.3 % (ref 37.5–51.0)
Hemoglobin: 16 g/dL (ref 13.0–17.7)
Immature Grans (Abs): 0 x10E3/uL (ref 0.0–0.1)
Immature Granulocytes: 0 %
Lymphocytes Absolute: 2.2 x10E3/uL (ref 0.7–3.1)
Lymphs: 23 %
MCH: 30.7 pg (ref 26.6–33.0)
MCHC: 33.8 g/dL (ref 31.5–35.7)
MCV: 91 fL (ref 79–97)
Monocytes Absolute: 1.2 x10E3/uL — ABNORMAL HIGH (ref 0.1–0.9)
Monocytes: 13 %
Neutrophils Absolute: 5.6 x10E3/uL (ref 1.4–7.0)
Neutrophils: 59 %
Platelets: 285 x10E3/uL (ref 150–450)
RBC: 5.21 x10E6/uL (ref 4.14–5.80)
RDW: 12.4 % (ref 11.6–15.4)
WBC: 9.4 x10E3/uL (ref 3.4–10.8)

## 2024-05-18 LAB — FOLATE: Folate: >20 ng/mL (ref >3.0)

## 2024-05-18 LAB — T4, FREE: Free T4: 0.94 ng/dL (ref 0.82–1.77)

## 2024-05-18 LAB — TSH: TSH: 3.4 u[IU]/mL (ref 0.450–4.500)

## 2024-05-18 LAB — VITAMIN B12: Vitamin B-12: 833 pg/mL (ref 232–1245)

## 2024-05-18 LAB — VITAMIN B1: Thiamine: 207.9 nmol/L — AB (ref 66.5–200.0)

## 2024-05-23 ENCOUNTER — Other Ambulatory Visit: Payer: Self-pay | Admitting: Family Medicine

## 2024-06-12 ENCOUNTER — Encounter: Payer: Self-pay | Admitting: Family Medicine

## 2024-06-12 ENCOUNTER — Ambulatory Visit: Admitting: Family Medicine

## 2024-06-16 ENCOUNTER — Ambulatory Visit: Payer: 59

## 2024-06-16 VITALS — BP 160/82 | HR 78 | Ht 65.0 in | Wt 165.0 lb

## 2024-06-16 DIAGNOSIS — Z0000 Encounter for general adult medical examination without abnormal findings: Secondary | ICD-10-CM

## 2024-06-16 NOTE — Progress Notes (Signed)
 "  Chief Complaint  Patient presents with   Medicare Wellness     Subjective:   Cameron Ware is a 64 y.o. male who presents for a Medicare Annual Wellness Visit.  Visit info / Clinical Intake: Medicare Wellness Visit Type:: Subsequent Annual Wellness Visit Persons participating in visit and providing information:: patient Medicare Wellness Visit Mode:: Telephone If telephone:: video declined Since this visit was completed virtually, some vitals may be partially provided or unavailable. Missing vitals are due to the limitations of the virtual format.: Unable to obtain vitals - no equipment If Telephone or Video please confirm:: I connected with patient using audio/video enable telemedicine. I verified patient identity with two identifiers, discussed telehealth limitations, and patient agreed to proceed. Patient Location:: home Provider Location:: office Interpreter Needed?: No Pre-visit prep was completed: yes AWV questionnaire completed by patient prior to visit?: no Living arrangements:: (!) lives alone Typical amount of pain: some (discomfort/might had pulled a muscle something per pt, recently/no pain) Does pain affect daily life?: no Are you currently prescribed opioids?: no  Dietary Habits and Nutritional Risks How many meals a day?: 2 Eats fruit and vegetables daily?: yes Most meals are obtained by: preparing own meals In the last 2 weeks, have you had any of the following?: none Diabetic:: no  Functional Status Activities of Daily Living (to include ambulation/medication): Independent Ambulation: Independent Medication Administration: Independent Home Management (perform basic housework or laundry): Independent Manage your own finances?: yes Primary transportation is: family / friends Concerns about vision?: no *vision screening is required for [facility]* (last ov been over 68yrs/will make an apt soon) Concerns about hearing?: no  Fall Screening Falls in the past  year?: 0 Number of falls in past year: 0 Was there an injury with Fall?: 0 Fall Risk Category Calculator: 0 Patient Fall Risk Level: Low Fall Risk  Fall Risk Patient at Risk for Falls Due to: No Fall Risks Fall risk Follow up: Falls evaluation completed; Education provided  Home and Transportation Safety: All rugs have non-skid backing?: yes All stairs or steps have railings?: N/A, no stairs Grab bars in the bathtub or shower?: (!) no Have non-skid surface in bathtub or shower?: yes Good home lighting?: yes Hospital stays in the last year:: no  Cognitive Assessment Difficulty concentrating, remembering, or making decisions? : no Will 6CIT or Mini Cog be Completed: yes What year is it?: 0 points What month is it?: 0 points Give patient an address phrase to remember (5 components): [address] [place] About what time is it?: 0 points Count backwards from 20 to 1: 0 points Say the months of the year in reverse: 0 points Repeat the address phrase from earlier: 0 points 6 CIT Score: 0 points  Advance Directives (For Healthcare) Does Patient Have a Medical Advance Directive?: No Would patient like information on creating a medical advance directive?: No - Patient declined  Reviewed/Updated  Reviewed/Updated: Reviewed All (Medical, Surgical, Family, Medications, Allergies, Care Teams, Patient Goals); Medical History; Surgical History; Family History; Medications; Allergies; Care Teams; Patient Goals    Allergies (verified) Patient has no known allergies.   Current Medications (verified) Outpatient Encounter Medications as of 06/16/2024  Medication Sig   amitriptyline  (ELAVIL ) 10 MG tablet Take 1 tablet (10 mg total) by mouth at bedtime.   aspirin  EC 81 MG tablet Take 81 mg by mouth daily. Swallow whole.   atorvastatin  (LIPITOR) 80 MG tablet Take 1 tablet (80 mg total) by mouth daily.   clopidogrel  (PLAVIX )  75 MG tablet Take 1 tablet (75 mg total) by mouth daily.    desvenlafaxine  (PRISTIQ ) 25 MG 24 hr tablet Take 1 tablet (25 mg total) by mouth daily.   famotidine  (PEPCID ) 20 MG tablet Take 1 tablet (20 mg total) by mouth 2 (two) times daily.   famotidine  (PEPCID ) 20 MG tablet TAKE ONE TABLET TWICE DAILY   lisinopril  (ZESTRIL ) 10 MG tablet Take 1 tablet (10 mg total) by mouth daily.   pantoprazole  (PROTONIX ) 40 MG tablet Take 40 mg by mouth daily.   polyethylene glycol powder (GLYCOLAX /MIRALAX ) 17 GM/SCOOP powder Take 8.5 g by mouth daily.   potassium chloride  (KLOR-CON ) 10 MEQ tablet TAKE TWO TABLETS TWICE DAILY   sildenafil  (VIAGRA ) 50 MG tablet Take 1 tablet (50 mg total) by mouth daily as needed for erectile dysfunction.   [DISCONTINUED] pravastatin (PRAVACHOL) 40 MG tablet Take 40 mg by mouth daily.     No facility-administered encounter medications on file as of 06/16/2024.    History: Past Medical History:  Diagnosis Date   Anorexia 11/29/2010   Arthritis    Decreased vision    Depression    Dyspnea on exertion    Generalized headaches    High cholesterol 01/30/2017   Hypertension    Peripheral vascular disease    Stroke [facility])    Past Surgical History:  Procedure Laterality Date   ABDOMINAL AORTAGRAM N/A 08/22/2011   Procedure: ABDOMINAL EZELLA;  Surgeon: Gaile LELON New, MD;  Location[facility] CATH LAB;  Service: Cardiovascular;  Laterality: N/A;   aortogram     COLONOSCOPY N/A 06/07/2017   Procedure: COLONOSCOPY;  Surgeon: [name], MD;  Location: [facility] ENDO SUITE;  Service: Endoscopy;  Laterality: N/A;  150   COLONOSCOPY WITH PROPOFOL  N/A 01/18/2023   Procedure: COLONOSCOPY WITH PROPOFOL ;  Surgeon: [name], MD;  Location: [facility] ENDO SUITE;  Service: Endoscopy;  Laterality: N/A;  1:00pm;asa 3   IR RADIOLOGIST EVAL & MGMT  02/02/2023   PERCUTANEOUS STENT INTERVENTION Bilateral 08/22/2011   Procedure: PERCUTANEOUS STENT INTERVENTION;  Surgeon: Gaile LELON New, MD;  Location: [facility] CATH LAB;  Service: Cardiovascular;  Laterality:  Bilateral;   POLYPECTOMY  01/18/2023   Procedure: POLYPECTOMY;  Surgeon: [name], MD;  Location: [facility] ENDO SUITE;  Service: Endoscopy;;   Stents  August 22, 2011   Bilateral iliac PTA and stenting   VISCERAL ANGIOGRAPHY N/A 10/08/2023   Procedure: VISCERAL ANGIOGRAPHY;  Surgeon: [name], MD;  Location: [facility] INVASIVE CV LAB;  Service: Cardiovascular;  Laterality: N/A;   VISCERAL ARTERY INTERVENTION N/A 10/08/2023   Procedure: VISCERAL ARTERY INTERVENTION;  Surgeon: [name], MD;  Location: [facility] INVASIVE CV LAB;  Service: Cardiovascular;  Laterality: N/A;   Family History  Problem Relation Age of Onset   Heart disease Mother    Diabetes Father    Colon cancer Father    Social History   Occupational History   Not on file  Tobacco Use   Smoking status: Some Days    Current packs/day: 2.00    Average packs/day: 1 pack/day for 51.9 years (52.5 ttl pk-yrs)    Types: Cigarettes    Start date: 08/04/1972   Smokeless tobacco: Never   Tobacco comments:    quit smoking 5 yrs ago. smoked one cigarette yesterday (09/25/2018)  Vaping Use   Vaping status: Former  Substance and Sexual Activity   Alcohol use: Yes    Comment: wine every other day; 2-3 a day on 02/20/2019  Drug use: Yes    Frequency: 3.0 times per week    Types: Marijuana   Sexual activity: Not on file   Tobacco Counseling Ready to quit: Not Answered Counseling given: Yes Tobacco comments: quit smoking 5 yrs ago. smoked one cigarette yesterday (09/25/2018)  SDOH Screenings   Food Insecurity: No Food Insecurity (06/16/2024)  Housing: Unknown (06/16/2024)  Transportation Needs: No Transportation Needs (06/16/2024)  Utilities: Not At Risk (06/16/2024)  Alcohol Screen: Low Risk (06/14/2023)  Depression (PHQ2-9): Low Risk (06/16/2024)  Financial Resource Strain: Low Risk (06/14/2023)  Physical Activity: Inactive (06/16/2024)  Social Connections: Moderately Isolated (06/16/2024)  Stress: No Stress  Concern Present (06/16/2024)  Tobacco Use: High Risk (06/16/2024)  Health Literacy: Adequate Health Literacy (06/16/2024)   See flowsheets for full screening details  Depression Screen Depression Screening Exception Documentation Depression Screening Exception:: Patient refusal  PHQ 2 & 9 Depression Scale- Over the past 2 weeks, how often have you been bothered by any of the following problems? Little interest or pleasure in doing things: 0 Feeling down, depressed, or hopeless (PHQ Adolescent also includes...irritable): 1 PHQ-2 Total Score: 1 Trouble falling or staying asleep, or sleeping too much: 2 Feeling tired or having little energy: 3 Poor appetite or overeating (PHQ Adolescent also includes...weight loss): 3 Feeling bad about yourself - or that you are a failure or have let yourself or your family down: 2 Trouble concentrating on things, such as reading the newspaper or watching television (PHQ Adolescent also includes...like school work): 3 Moving or speaking so slowly that other people could have noticed. Or the opposite - being so fidgety or restless that you have been moving around a lot more than usual: 0 Thoughts that you would be better off dead, or of hurting yourself in some way: 0 PHQ-9 Total Score: 18 If you checked off any problems, how difficult have these problems made it for you to do your work, take care of things at home, or get along with other people?: Somewhat difficult  Depression Treatment Depression Interventions/Treatment : Currently on Treatment     Goals Addressed             This Visit's Progress    Exercise 3x per week (30 min per time)   On track    Continue to exercise.             Objective:    Today's Vitals   06/16/24 0911  BP: (!) 160/82  Pulse: 78  Weight: 165 lb (74.8 kg)  Height: 5' 5 (1.651 m)   Body mass index is 27.46 kg/m.  Hearing/Vision screen No results found. Immunizations and Health Maintenance Health  Maintenance  Topic Date Due   Lung Cancer Screening  05/14/2025 (Originally 12/05/2018)   Medicare Annual Wellness (AWV)  06/16/2025   Colonoscopy  01/18/2028   DTaP/Tdap/Td (2 - Td or Tdap) 03/24/2029   Pneumococcal Vaccine: 50+ Years  Completed   Influenza Vaccine  Completed   Hepatitis C Screening  Completed   HIV Screening  Completed   Zoster Vaccines- Shingrix   Completed   Hepatitis B Vaccines 19-59 Average Risk  Aged Out   Meningococcal B Vaccine  Aged Out   COVID-19 Vaccine  Discontinued        Assessment/Plan:  This is a routine wellness examination for [place].  Patient Care Team: [name], FNP as PCP - General (Family Medicine) [facility] Ryan Askew, MD as Referring Physician (Cardiology)  I have personally reviewed and noted the  following in the patients chart:   Medical and social history Use of alcohol, tobacco or illicit drugs  Current medications and supplements including opioid prescriptions. Functional ability and status Nutritional status Physical activity Advanced directives List of other physicians Hospitalizations, surgeries, and ER visits in previous 12 months Vitals Screenings to include cognitive, depression, and falls Referrals and appointments  No orders of the defined types were placed in this encounter.  In addition, I have reviewed and discussed with patient certain preventive protocols, quality metrics, and best practice recommendations. A written personalized care plan for preventive services as well as general preventive health recommendations were provided to patient.   Cameron Ware, CMA   06/16/2024   Return in 1 year (on 06/16/2025).  After Visit Summary: (MyChart) Due to this being a telephonic visit, the after visit summary with patients personalized plan was offered to patient via MyChart   Nurse Notes: n/a "

## 2024-06-16 NOTE — Patient Instructions (Signed)
 Mr. Anastasi,  Thank you for taking the time for your Medicare Wellness Visit. I appreciate your continued commitment to your health goals. Please review the care plan we discussed, and feel free to reach out if I can assist you further.  Please note that Annual Wellness Visits do not include a physical exam. Some assessments may be limited, especially if the visit was conducted virtually. If needed, we may recommend an in-person follow-up with your provider.  Ongoing Care Seeing your primary care provider every 3 to 6 months helps us  monitor your health and provide consistent, personalized care.   Referrals If a referral was made during today's visit and you haven't received any updates within two weeks, please contact the referred provider directly to check on the status.  Recommended Screenings:  Health Maintenance  Topic Date Due   Medicare Annual Wellness Visit  06/13/2024   Screening for Lung Cancer  05/14/2025*   Colon Cancer Screening  01/18/2028   DTaP/Tdap/Td vaccine (2 - Td or Tdap) 03/24/2029   Pneumococcal Vaccine for age over 36  Completed   Flu Shot  Completed   Hepatitis C Screening  Completed   HIV Screening  Completed   Zoster (Shingles) Vaccine  Completed   Hepatitis B Vaccine  Aged Out   Meningitis B Vaccine  Aged Out   COVID-19 Vaccine  Discontinued  *Topic was postponed. The date shown is not the original due date.       06/16/2024    9:14 AM  Advanced Directives  Does Patient Have a Medical Advance Directive? No  Would patient like information on creating a medical advance directive? No - Patient declined    Vision: Annual vision screenings are recommended for early detection of glaucoma, cataracts, and diabetic retinopathy. These exams can also reveal signs of chronic conditions such as diabetes and high blood pressure.  Dental: Annual dental screenings help detect early signs of oral cancer, gum disease, and other conditions linked to overall  health, including heart disease and diabetes.  Please see the attached documents for additional preventive care recommendations.

## 2024-06-17 ENCOUNTER — Ambulatory Visit (INDEPENDENT_AMBULATORY_CARE_PROVIDER_SITE_OTHER): Admitting: Family Medicine

## 2024-06-17 ENCOUNTER — Encounter: Payer: Self-pay | Admitting: Family Medicine

## 2024-06-17 VITALS — BP 128/78 | HR 89 | Temp 97.5°F | Ht 65.0 in | Wt 161.0 lb

## 2024-06-17 DIAGNOSIS — I10 Essential (primary) hypertension: Secondary | ICD-10-CM | POA: Diagnosis not present

## 2024-06-17 DIAGNOSIS — N521 Erectile dysfunction due to diseases classified elsewhere: Secondary | ICD-10-CM

## 2024-06-17 DIAGNOSIS — R399 Unspecified symptoms and signs involving the genitourinary system: Secondary | ICD-10-CM

## 2024-06-17 DIAGNOSIS — I779 Disorder of arteries and arterioles, unspecified: Secondary | ICD-10-CM | POA: Diagnosis not present

## 2024-06-17 DIAGNOSIS — I774 Celiac artery compression syndrome: Secondary | ICD-10-CM | POA: Diagnosis not present

## 2024-06-17 DIAGNOSIS — F331 Major depressive disorder, recurrent, moderate: Secondary | ICD-10-CM

## 2024-06-17 DIAGNOSIS — R351 Nocturia: Secondary | ICD-10-CM | POA: Diagnosis not present

## 2024-06-17 NOTE — Progress Notes (Signed)
 "    Subjective:  [name], male    DOB: 11-27-1960, 64 y.o.   MRN: [id number]  Patient Care Team: [name], FNP as PCP - [facility] ([facility]) [facility], [facility] Askew, MD as Referring Physician (Cardiology)   Chief Complaint:  Hypertension (4 week follow up )   HPI: Cameron Ware is a 64 y.o. male presenting on 06/17/2024 for Hypertension (4 week follow up )   Cameron Ware is a 64 year old male with hypertension and peripheral artery disease who presents for follow-up of blood pressure management and abdominal discomfort.  He started lisinopril  10 mg for hypertension, resulting in improved blood pressure readings around 137/75 mmHg. No significant side effects such as headaches, chest pain, or leg swelling are noted.  He describes ongoing abdominal discomfort, attributing it to a possible muscle strain with a sensation of something 'torn'. Discomfort occurs with activities like pulling or pushing. Recent CT scans, including a CT of the pelvis with angiography, showed no significant abnormalities except for diverticulosis.  He is taking Plavix  and aspirin  for peripheral artery disease, which has some arterial stenosis. He continues to experience erectile dysfunction. Viagra  50 mg was ineffective in the past.  He experiences nocturia, urinating one to two times per night, and occasional dribbling during the day. He has a family history of cancer and undergoes regular prostate checks, with the last PSA test in March showing normal results.  He is on medication for depression, which he feels is generally effective, though he notes variability in his mood. He continues to manage his symptoms and is able to work through daily activities.          Relevant past medical, surgical, family, and social history reviewed and updated as indicated.  Allergies and medications reviewed and updated. Data reviewed: Chart in  Epic.   Past Medical History:  Diagnosis Date   Anorexia 11/29/2010   Arthritis    Decreased vision    Depression    Dyspnea on exertion    Generalized headaches    High cholesterol 01/30/2017   Hypertension    Peripheral vascular disease    Stroke [facility])     Past Surgical History:  Procedure Laterality Date   ABDOMINAL AORTAGRAM N/A 08/22/2011   Procedure: ABDOMINAL EZELLA;  Surgeon: Gaile LELON New, MD;  Location: [facility] CATH LAB;  Service: Cardiovascular;  Laterality: N/A;   aortogram     COLONOSCOPY N/A 06/07/2017   Procedure: COLONOSCOPY;  Surgeon: [name], MD;  Location: [facility] ENDO SUITE;  Service: Endoscopy;  Laterality: N/A;  150   COLONOSCOPY WITH PROPOFOL  N/A 01/18/2023   Procedure: COLONOSCOPY WITH PROPOFOL ;  Surgeon: [name], MD;  Location: [facility] ENDO SUITE;  Service: Endoscopy;  Laterality: N/A;  1:00pm;asa 3   IR RADIOLOGIST EVAL & MGMT  02/02/2023   PERCUTANEOUS STENT INTERVENTION Bilateral 08/22/2011   Procedure: PERCUTANEOUS STENT INTERVENTION;  Surgeon: Gaile LELON New, MD;  Location: [facility] CATH LAB;  Service: Cardiovascular;  Laterality: Bilateral;   POLYPECTOMY  01/18/2023   Procedure: POLYPECTOMY;  Surgeon: [name], MD;  Location: [facility] ENDO [facility];  Service: Endoscopy;;   Stents  August 22, 2011   Bilateral iliac PTA and stenting   VISCERAL ANGIOGRAPHY N/A 10/08/2023   Procedure: VISCERAL ANGIOGRAPHY;  Surgeon: [name], MD;  Location: [facility] INVASIVE CV LAB;  Service: Cardiovascular;  Laterality: N/A;   VISCERAL ARTERY INTERVENTION N/A 10/08/2023   Procedure: VISCERAL ARTERY  INTERVENTION;  Surgeon: [name], MD;  Location: [facility] INVASIVE CV LAB;  Service: Cardiovascular;  Laterality: N/A;    Social History   Socioeconomic History   Marital status: Single    Spouse name: Not on file   Number of children: Not on file   Years of education: Not on file   Highest education level: Not on file  Occupational History   Not  on file  Tobacco Use   Smoking status: Some Days    Current packs/day: 2.00    Average packs/day: 1 pack/day for 51.9 years (52.5 ttl pk-yrs)    Types: Cigarettes    Start date: 08/04/1972   Smokeless tobacco: Never   Tobacco comments:    quit smoking 5 yrs ago. smoked one cigarette yesterday (09/25/2018)  Vaping Use   Vaping status: Former  Substance and Sexual Activity   Alcohol use: Yes    Comment: wine every other day; 2-3 a day on 02/20/2019   Drug use: Yes    Frequency: 3.0 times per week    Types: Marijuana   Sexual activity: Not on file  Other Topics Concern   Not on file  Social History Narrative   Lives with mother, Lynder, who is currently under Hospice care-06/08/2021.   Brother and sister in law live nearby and helps with pt's mom.   Social Drivers of Health   Tobacco Use: High Risk (06/17/2024)   Patient History    Smoking Tobacco Use: Some Days    Smokeless Tobacco Use: Never    Passive Exposure: Not on file  Financial Resource Strain: Low Risk (06/14/2023)   Overall Financial Resource Strain (CARDIA)    Difficulty of Paying Living Expenses: Not hard at all  Food Insecurity: No Food Insecurity (06/16/2024)   Epic    Worried About Programme Researcher, Broadcasting/film/video in the Last Year: Never true    Ran Out of Food in the Last Year: Never true  Transportation Needs: No Transportation Needs (06/16/2024)   Epic    Lack of Transportation (Medical): No    Lack of Transportation (Non-Medical): No  Physical Activity: Inactive (06/16/2024)   Exercise Vital Sign    Days of Exercise per Week: 0 days    Minutes of Exercise per Session: 0 min  Stress: No Stress Concern Present (06/16/2024)   [employer] of Occupational Health - Occupational Stress Questionnaire    Feeling of Stress: Not at all  Social Connections: Moderately Isolated (06/16/2024)   Social Connection and Isolation Panel    Frequency of Communication with Friends and Family: More than three times a week    Frequency of  Social Gatherings with Friends and Family: Three times a week    Attends Religious Services: 1 to 4 times per year    Active Member of Clubs or Organizations: No    Attends Banker Meetings: Never    Marital Status: Never married  Intimate Partner Violence: Not At Risk (06/16/2024)   Epic    Fear of Current or Ex-Partner: No    Emotionally Abused: No    Physically Abused: No    Sexually Abused: No  Depression (PHQ2-9): Low Risk (06/16/2024)   Depression (PHQ2-9)    PHQ-2 Score: 1  Alcohol Screen: Low Risk (06/14/2023)   Alcohol Screen    Last Alcohol Screening Score (AUDIT): 0  Housing: Unknown (06/16/2024)   Epic    Unable to Pay for Housing in the Last Year: No    Number of Times Moved  in the Last Year: Not on file    Homeless in the Last Year: No  Utilities: Not At Risk (06/16/2024)   Epic    Threatened with loss of utilities: No  Health Literacy: Adequate Health Literacy (06/16/2024)   B1300 Health Literacy    Frequency of need for help with medical instructions: Never    Outpatient Encounter Medications as of 06/17/2024  Medication Sig   amitriptyline  (ELAVIL ) 10 MG tablet Take 1 tablet (10 mg total) by mouth at bedtime.   aspirin  EC 81 MG tablet Take 81 mg by mouth daily. Swallow whole.   atorvastatin  (LIPITOR) 80 MG tablet Take 1 tablet (80 mg total) by mouth daily.   clopidogrel  (PLAVIX ) 75 MG tablet Take 1 tablet (75 mg total) by mouth daily.   desvenlafaxine  (PRISTIQ ) 25 MG 24 hr tablet Take 1 tablet (25 mg total) by mouth daily.   famotidine  (PEPCID ) 20 MG tablet Take 1 tablet (20 mg total) by mouth 2 (two) times daily.   famotidine  (PEPCID ) 20 MG tablet TAKE ONE TABLET TWICE DAILY   lisinopril  (ZESTRIL ) 10 MG tablet Take 1 tablet (10 mg total) by mouth daily.   pantoprazole  (PROTONIX ) 40 MG tablet Take 40 mg by mouth daily.   polyethylene glycol powder (GLYCOLAX /MIRALAX ) 17 GM/SCOOP powder Take 8.5 g by mouth daily.   potassium chloride  (KLOR-CON ) 10 MEQ  tablet TAKE TWO TABLETS TWICE DAILY   sildenafil  (VIAGRA ) 50 MG tablet Take 1 tablet (50 mg total) by mouth daily as needed for erectile dysfunction.   [DISCONTINUED] pravastatin (PRAVACHOL) 40 MG tablet Take 40 mg by mouth daily.     No facility-administered encounter medications on file as of 06/17/2024.    Allergies[1]  Pertinent ROS per HPI, otherwise unremarkable      Objective:  BP 128/78   Pulse 89   Temp (!) 97.5 F (36.4 C)   Ht 5' 5 (1.651 m)   Wt 161 lb (73 kg)   SpO2 96%   BMI 26.79 kg/m    Wt Readings from Last 3 Encounters:  06/17/24 161 lb (73 kg)  06/16/24 165 lb (74.8 kg)  05/14/24 165 lb 9.6 oz (75.1 kg)    Physical Exam Vitals and nursing note reviewed.  Constitutional:      General: He is not in acute distress.    Appearance: Normal appearance. He is overweight. He is not ill-appearing, toxic-appearing or diaphoretic.  HENT:     Head: Normocephalic and atraumatic.     Nose: Nose normal.     Mouth/Throat:     Mouth: Mucous membranes are moist.  Eyes:     Conjunctiva/sclera: Conjunctivae normal.     Pupils: Pupils are equal, round, and reactive to light.  Cardiovascular:     Rate and Rhythm: Normal rate and regular rhythm.     Heart sounds: Normal heart sounds.  Pulmonary:     Effort: Pulmonary effort is normal.     Breath sounds: Normal breath sounds.  Abdominal:     General: Bowel sounds are normal. There is distension.     Tenderness: There is no abdominal tenderness.  Musculoskeletal:     Cervical back: Neck supple.     Right lower leg: No edema.     Left lower leg: No edema.  Skin:    General: Skin is warm and dry.     Capillary Refill: Capillary refill takes less than 2 seconds.  Neurological:     General: No focal deficit present.  Mental Status: He is alert and oriented to person, place, and time.  Psychiatric:        Mood and Affect: Mood normal.        Behavior: Behavior normal.        Thought Content: Thought content  normal.        Judgment: Judgment normal.     Results for orders placed or performed in visit on 05/14/24  CMP14+EGFR   Collection Time: 05/14/24 11:19 AM  Result Value Ref Range   Glucose 85 70 - 99 mg/dL   BUN 22 8 - 27 mg/dL   Creatinine, Ser 9.05 0.76 - 1.27 mg/dL   eGFR 90 >40 fO/fpw/8.26   BUN/Creatinine Ratio 23 10 - 24   Sodium 143 134 - 144 mmol/L   Potassium 4.8 3.5 - 5.2 mmol/L   Chloride 103 96 - 106 mmol/L   CO2 24 20 - 29 mmol/L   Calcium  10.3 (H) 8.6 - 10.2 mg/dL   Total Protein 6.9 6.0 - 8.5 g/dL   Albumin 4.4 3.9 - 4.9 g/dL   Globulin, Total 2.5 1.5 - 4.5 g/dL   Bilirubin Total 0.5 0.0 - 1.2 mg/dL   Alkaline Phosphatase 147 (H) 47 - 123 IU/L   AST 22 0 - 40 IU/L   ALT 28 0 - 44 IU/L  CBC with Differential/Platelet   Collection Time: 05/14/24 11:19 AM  Result Value Ref Range   WBC 9.4 3.4 - 10.8 x10E3/uL   RBC 5.21 4.14 - 5.80 x10E6/uL   Hemoglobin 16.0 13.0 - 17.7 g/dL   Hematocrit 52.6 62.4 - 51.0 %   MCV 91 79 - 97 fL   MCH 30.7 26.6 - 33.0 pg   MCHC 33.8 31.5 - 35.7 g/dL   RDW 87.5 88.3 - 84.5 %   Platelets 285 150 - 450 x10E3/uL   Neutrophils 59 Not Estab. %   Lymphs 23 Not Estab. %   Monocytes 13 Not Estab. %   Eos 4 Not Estab. %   Basos 1 Not Estab. %   Neutrophils Absolute 5.6 1.4 - 7.0 x10E3/uL   Lymphocytes Absolute 2.2 0.7 - 3.1 x10E3/uL   Monocytes Absolute 1.2 (H) 0.1 - 0.9 x10E3/uL   EOS (ABSOLUTE) 0.3 0.0 - 0.4 x10E3/uL   Basophils Absolute 0.1 0.0 - 0.2 x10E3/uL   Immature Granulocytes 0 Not Estab. %   Immature Grans (Abs) 0.0 0.0 - 0.1 x10E3/uL  Lipid panel   Collection Time: 05/14/24 11:19 AM  Result Value Ref Range   Cholesterol, Total 122 100 - 199 mg/dL   Triglycerides 824 (H) 0 - 149 mg/dL   HDL 44 >60 mg/dL   VLDL Cholesterol Cal 29 5 - 40 mg/dL   LDL Chol Calc (NIH) 49 0 - 99 mg/dL   Chol/HDL Ratio 2.8 0.0 - 5.0 ratio  TSH   Collection Time: 05/14/24 11:19 AM  Result Value Ref Range   TSH 3.400 0.450 - 4.500 uIU/mL   T4, Free   Collection Time: 05/14/24 11:19 AM  Result Value Ref Range   Free T4 0.94 0.82 - 1.77 ng/dL  Vitamin B12   Collection Time: 05/14/24 11:19 AM  Result Value Ref Range   Vitamin B-12 833 232 - 1,245 pg/mL  Folate   Collection Time: 05/14/24 11:19 AM  Result Value Ref Range   Folate >20.0 >3.0 ng/mL  Vitamin B1   Collection Time: 05/14/24 11:19 AM  Result Value Ref Range   Thiamine  207.9 (H) 66.5 - 200.0 nmol/L  Pertinent labs & imaging results that were available during my care of the patient were reviewed by me and considered in my medical decision making.  Assessment & Plan:  Jameison was seen today for hypertension.  Diagnoses and all orders for this visit:  Moderate episode of recurrent major depressive disorder (HCC) -     Basic metabolic panel with GFR  Primary hypertension -     Basic metabolic panel with GFR  Stenosis of celiac artery -     Basic metabolic panel with GFR  Lower urinary tract symptoms (LUTS) -     Basic metabolic panel with GFR -     PSA, total and free -     Ambulatory referral to Urology  Nocturia -     Basic metabolic panel with GFR -     PSA, total and free -     Ambulatory referral to Urology  Erectile dysfunction due to arterial disease -     Ambulatory referral to Urology      Primary hypertension Blood pressure is well controlled with lisinopril  10 mg. Current reading is 137/75 mmHg, close to the target of 130/80 mmHg. No adverse effects reported from the medication. - Continue lisinopril  10 mg daily  Major depressive disorder, recurrent, moderate Depression is managed with current medication regimen. Reports variable daily symptoms but is able to manage them effectively. - Continue current depression medication regimen  Celiac artery stenosis Managed with Plavix  and aspirin . - Continue Plavix  and aspirin  as prescribed  Lower urinary tract symptoms with nocturia Experiencing nocturia with 1-2 episodes per  night and occasional daytime dribbling. Family history of prostate cancer. Previous PSA test in March was normal. - Ordered PSA test - Referred to urologist for further evaluation  Erectile dysfunction due to arterial disease Erectile dysfunction possibly related to arterial disease. Previous trial of Viagra  50 mg was ineffective. No recent spontaneous erections reported. - Referred to urologist for further evaluation - Will consider increasing Viagra  dosage if needed          Continue all other maintenance medications.  Follow up plan: Return next available CPE, for Annual Physical.   Continue healthy lifestyle choices, including diet (rich in fruits, vegetables, and lean proteins, and low in salt and simple carbohydrates) and exercise (at least 30 minutes of moderate physical activity daily).  Educational handout given for DASH diet, HTN  The above assessment and management plan was discussed with the patient. The patient verbalized understanding of and has agreed to the management plan. Patient is aware to call the clinic if they develop any new symptoms or if symptoms persist or worsen. Patient is aware when to return to the clinic for a follow-up visit. Patient educated on when it is appropriate to go to the emergency department.   Rosaline Bruns, FNP-C [facility] [phone]     [1] No Known Allergies  "

## 2024-06-17 NOTE — Patient Instructions (Signed)
 Goal BP:  Less than 130/80  Take your medications faithfully as prescribed. Maintain a healthy weight. Get at least 150 minutes of aerobic exercise per week. Minimize salt intake, less than 2000 mg per day. Minimize alcohol intake.  DASH Eating Plan DASH stands for Dietary Approaches to Stop Hypertension. The DASH eating plan is a healthy eating plan that has been shown to reduce high blood pressure (hypertension). Additional health benefits may include reducing the risk of type 2 diabetes mellitus, heart disease, and stroke. The DASH eating plan may also help with weight loss.  WHAT DO I NEED TO KNOW ABOUT THE DASH EATING PLAN? For the DASH eating plan, you will follow these general guidelines: Choose foods with a percent daily value for sodium of less than 5% (as listed on the food label). Use salt-free seasonings or herbs instead of table salt or sea salt. Check with your health care provider or pharmacist before using salt substitutes. Eat lower-sodium products, often labeled as lower sodium or no salt added. Eat fresh foods. Eat more vegetables, fruits, and low-fat dairy products. Choose whole grains. Look for the word whole as the first word in the ingredient list. Choose fish and skinless chicken or [place] more often than red meat. Limit fish, poultry, and meat to 6 oz (170 g) each day. Limit sweets, desserts, sugars, and sugary drinks. Choose heart-healthy fats. Limit cheese to 1 oz (28 g) per day. Eat more home-cooked food and less restaurant, buffet, and fast food. Limit fried foods. Cook foods using methods other than frying. Limit canned vegetables. If you do use them, rinse them well to decrease the sodium. When eating at a restaurant, ask that your food be prepared with less salt, or no salt if possible.  WHAT FOODS CAN I EAT? Seek help from a dietitian for individual calorie needs.  Grains Whole grain or whole wheat bread. Hoshino rice. Whole grain or whole  wheat pasta. Quinoa, bulgur, and whole grain cereals. Low-sodium cereals. Corn or whole wheat flour tortillas. Whole grain cornbread. Whole grain crackers. Low-sodium crackers.  Vegetables Fresh or frozen vegetables (raw, steamed, roasted, or grilled). Low-sodium or reduced-sodium tomato and vegetable juices. Low-sodium or reduced-sodium tomato sauce and paste. Low-sodium or reduced-sodium canned vegetables.   Fruits All fresh, canned (in natural juice), or frozen fruits.  Meat and Other Protein Products Ground beef (85% or leaner), grass-fed beef, or beef trimmed of fat. Skinless chicken or [place]. Ground chicken or [place]. Pork trimmed of fat. All fish and seafood. Eggs. Dried beans, peas, or lentils. Unsalted nuts and seeds. Unsalted canned beans.  Dairy Low-fat dairy products, such as skim or 1% milk, 2% or reduced-fat cheeses, low-fat ricotta or cottage cheese, or plain low-fat yogurt. Low-sodium or reduced-sodium cheeses.  Fats and Oils Tub margarines without trans fats. Light or reduced-fat mayonnaise and salad dressings (reduced sodium). Avocado. Safflower, olive, or canola oils. Natural peanut or almond butter.  Other Unsalted popcorn and pretzels. The items listed above may not be a complete list of recommended foods or beverages. Contact your dietitian for more options.  WHAT FOODS ARE NOT RECOMMENDED?  Grains White bread. White pasta. White rice. Refined cornbread. Bagels and croissants. Crackers that contain trans fat.  Vegetables Creamed or fried vegetables. Vegetables in a cheese sauce. Regular canned vegetables. Regular canned tomato sauce and paste. Regular tomato and vegetable juices.  Fruits Dried fruits. Canned fruit in light or heavy syrup. Fruit juice.  Meat and Other Protein Products Fatty cuts of meat. Ribs,  chicken wings, bacon, sausage, bologna, salami, chitterlings, fatback, hot dogs, bratwurst, and packaged luncheon meats. Salted nuts and seeds. Canned  beans with salt.  Dairy Whole or 2% milk, cream, half-and-half, and cream cheese. Whole-fat or sweetened yogurt. Full-fat cheeses or blue cheese. Nondairy creamers and whipped toppings. Processed cheese, cheese spreads, or cheese curds.  Condiments Onion and garlic salt, seasoned salt, table salt, and sea salt. Canned and packaged gravies. Worcestershire sauce. Tartar sauce. Barbecue sauce. Teriyaki sauce. Soy sauce, including reduced sodium. Steak sauce. Fish sauce. Oyster sauce. Cocktail sauce. Horseradish. Ketchup and mustard. Meat flavorings and tenderizers. Bouillon cubes. Hot sauce. Tabasco sauce. Marinades. Taco seasonings. Relishes.  Fats and Oils Butter, stick margarine, lard, shortening, ghee, and bacon fat. Coconut, palm kernel, or palm oils. Regular salad dressings.  Other Pickles and olives. Salted popcorn and pretzels.  The items listed above may not be a complete list of foods and beverages to avoid. Contact your dietitian for more information.  WHERE CAN I FIND MORE INFORMATION? National [employer], [employer], [employer]: [email or web address] Document Released: 05/04/2011 Document Revised: 09/29/2013 Document Reviewed: 03/19/2013 [facility] Patient Information 2015 [place], [place]. This information is not intended to replace advice given to you by your health care provider. Make sure you discuss any questions you have with your health care provider.   I think that you would greatly benefit from seeing a nutritionist.  If you are interested, please call Dr. Wonda at [phone] to schedule an appointment.

## 2024-06-18 ENCOUNTER — Ambulatory Visit: Payer: Self-pay | Admitting: Family Medicine

## 2024-06-18 LAB — PSA, TOTAL AND FREE
PSA, Free Pct: 24.6 %
PSA, Free: 0.32 ng/mL
Prostate Specific Ag, Serum: 1.3 ng/mL (ref 0.0–4.0)

## 2024-06-18 LAB — BASIC METABOLIC PANEL WITH GFR
BUN/Creatinine Ratio: 10 (ref 10–24)
BUN: 10 mg/dL (ref 8–27)
CO2: 23 mmol/L (ref 20–29)
Calcium: 9.8 mg/dL (ref 8.6–10.2)
Chloride: 101 mmol/L (ref 96–106)
Creatinine, Ser: 0.98 mg/dL (ref 0.76–1.27)
Glucose: 91 mg/dL (ref 70–99)
Potassium: 4.2 mmol/L (ref 3.5–5.2)
Sodium: 139 mmol/L (ref 134–144)
eGFR: 86 mL/min/1.73

## 2024-07-04 ENCOUNTER — Encounter: Payer: Self-pay | Admitting: Urology

## 2024-07-04 ENCOUNTER — Ambulatory Visit: Admitting: Urology

## 2024-07-04 VITALS — BP 122/72 | HR 69

## 2024-07-04 DIAGNOSIS — N401 Enlarged prostate with lower urinary tract symptoms: Secondary | ICD-10-CM

## 2024-07-04 DIAGNOSIS — N5201 Erectile dysfunction due to arterial insufficiency: Secondary | ICD-10-CM

## 2024-07-04 DIAGNOSIS — R351 Nocturia: Secondary | ICD-10-CM

## 2024-07-04 LAB — URINALYSIS, ROUTINE W REFLEX MICROSCOPIC
Bilirubin, UA: NEGATIVE
Glucose, UA: NEGATIVE
Ketones, UA: NEGATIVE
Leukocytes,UA: NEGATIVE
Nitrite, UA: NEGATIVE
Protein,UA: NEGATIVE
RBC, UA: NEGATIVE
Specific Gravity, UA: <1.005 — ABNORMAL LOW (ref 1.005–1.030)
Urobilinogen, Ur: 0.2 mg/dL (ref 0.2–1.0)
pH, UA: 6 (ref 5.0–7.5)

## 2024-07-04 LAB — BLADDER SCAN AMB NON-IMAGING: Scan Result: 259

## 2024-07-04 MED ORDER — ALFUZOSIN HCL ER 10 MG PO TB24: 10.0000 mg | ORAL_TABLET | Freq: Every day | ORAL | 11 refills | Status: AC

## 2024-07-04 NOTE — Patient Instructions (Signed)
 Enlarged Prostate (Benign Prostatic Hyperplasia): What to Know  Benign prostatic hyperplasia, or BPH, means that the prostate has become bigger. This is also called enlarged prostate. BPH is not cancer. The prostate is a small gland that's in front of the rectum and below the bladder. It helps make semen. When the prostate gets bigger, it can press on the urethra, which is the tube that carries pee out of the bladder. This can make it hard for you to pee. If pee stays in your bladder, it can cause infections. This can hurt the bladder and kidneys. What are the causes? BPH happens because of age. But not all males have problems from an enlarged prostate. What increases the risk? Males over 59 years old are more likely to get BPH. What are the signs or symptoms? When you have BPH, you may: Feel like you can't fully empty your bladder. Need to pee often, day and night. Need to pee right away. Have trouble starting to pee, or you may dribble after you pee. Have a weak or slow pee stream. Get urinary tract infections (UTIs) often. How is this diagnosed? BPH is diagnosed based on your medical history, a physical exam, and your symptoms. Tests will also be done, such as: A digital rectal exam. In a rectal exam, your health care provider checks your prostate for any lumps or growths. Pee tests. A prostate specific antigen test, also called a PSA test. This blood test checks for prostate cancer. An ultrasound. A bladder scan. This checks how much pee is left in your bladder after you pee. Your provider may refer you to a specialist in kidney and prostate diseases (urologist). How is this treated? Treatment for BPH depends on how bad your symptoms are. It may include: Observation and yearly exams. If your symptoms are mild, this may be all you need. Watchful waiting. This means your provider will check your BPH regularly but won't treat it right away. Medicines to shrink the prostate, or to relax  the muscle in the prostate. Surgery if your symptoms are very bad. This may include: Prostatectomy. The prostate tissue is removed. Transurethral resection of the prostate (TURP). A part of the prostate is removed through the penis. This will remove the blockage. Transurethral incision of the prostate (TUIP). Small cuts are made in the prostate. This lessens the pressure. Transurethral microwave thermotherapy (TUMT). This uses microwaves to create heat. The heat destroys and removes a small amount of prostate tissue. Transurethral needle ablation (TUNA). This uses radio waves to destroy and remove a small amount of prostate tissue. Interstitial laser coagulation (ILC). This uses a laser to destroy and remove a small amount of prostate tissue. Transurethral electrovaporization (TUVP). This uses electrodes to destroy and remove a small amount of prostate tissue. Prostatic urethral lift. This uses an implant to push the lobes of the prostate away from the urethra. Follow these instructions at home: Take your medicines only as told. Watch your symptoms for any changes. Tell your provider if you see changes or if something worries you. Avoid drinking lots of liquids before going to bed or going out. Avoid or reduce how much caffeine or alcohol you drink. Give yourself time when you pee. Contact a health care provider if: You have unexplained back pain. Your symptoms don't get better with treatment. You have side effects from your medicine. Your pee is very dark or smells bad. Your lower belly swells and you can't pee. You have a fever or chills. Get help  right away if: You suddenly can't pee. You feel light-headed or very dizzy, or you faint. You see a lot of blood or clots in your pee. Your peeing problems get worse. You have very bad pain in your lower back or flank. The flank is the side of your body between the ribs and the hip. These symptoms may be an emergency. Call 911 right away. Do  not wait to see if the symptoms will go away. Do not drive yourself to the hospital. This information is not intended to replace advice given to you by your health care provider. Make sure you discuss any questions you have with your health care provider. Document Revised: 09/18/2023 Document Reviewed: 09/18/2023 Elsevier Patient Education  2025 [employer].

## 2024-07-04 NOTE — Progress Notes (Signed)
 "  07/04/2024 12:05 PM   Cameron Ware 01-Sep-1960 [id number]  Referring provider: [name], FNP [address] [place],  [place] [address]  Nocturia.    HPI: Cameron Ware is a 63yo here for evaluation of BPH with nocturia. PVR 259cc. IPSS 12 QOL 3 on no BPH therapy.  Nocturia 0-2x depending on fluid consumption. Urine stream strong. No straining to urinate. He has to double void. He has issues getting a firm erection. He previously tried sildenafil  50mg  prn with poor results. He has significant CAD and PVD.    PMH: Past Medical History:  Diagnosis Date   Anorexia 11/29/2010   Arthritis    Decreased vision    Depression    Dyspnea on exertion    Generalized headaches    High cholesterol 01/30/2017   Hypertension    Peripheral vascular disease    Stroke [facility])     Surgical History: Past Surgical History:  Procedure Laterality Date   ABDOMINAL AORTAGRAM N/A 08/22/2011   Procedure: ABDOMINAL EZELLA;  Surgeon: Gaile ORN New, MD;  Location: [facility] CATH LAB;  Service: Cardiovascular;  Laterality: N/A;   aortogram     COLONOSCOPY N/A 06/07/2017   Procedure: COLONOSCOPY;  Surgeon: [name], MD;  Location: [facility]O SU[facility];  Service: Endoscopy;  Laterality: N/A;  150   COLONOSCOPY WITH PROPOFOL  N/A 01/18/2023   Procedure: COLONOSCOPY WITH PROPOFOL ;  Surgeon: [name], MD;  Location: [facility];  Service: Endoscopy;  Laterality: N/A;  1:00pm;asa 3   IR RADIOLOGIST EVAL & MGMT  02/02/2023   PERCUTANEOUS STENT INTERVENTION Bilateral 08/22/2011   Procedure: PERCUTANEOUS STENT INTERVENTION;  Surgeon: Gaile ORN New, MD;  Location: [facility] CATH LAB;  Service: Cardiovascular;  Laterality: Bilateral;   POLYPECTOMY  01/18/2023   Procedure: POLYPECTOMY;  Surgeon: [name], MD;  Location: [facility];  Service: Endoscopy;;   Stents  August 22, 2011   Bilateral iliac PTA and stenting   VISCERAL ANGIOGRAPHY N/A 10/08/2023   Procedure: VISCERAL ANGIOGRAPHY;   Surgeon: [name], MD;  Location: [facility] INVASIVE CV LAB;  Service: Cardiovascular;  Laterality: N/A;   VISCERAL ARTERY INTERVENTION N/A 10/08/2023   Procedure: VISCERAL ARTERY INTERVENTION;  Surgeon: [name], MD;  Location: [facility] INVASIVE CV LAB;  Service: Cardiovascular;  Laterality: N/A;    Home Medications:  Allergies as of 07/04/2024   No Known Allergies      Medication List        Accurate as of July 04, 2024 12:05 PM. If you have any questions, ask your nurse or doctor.          amitriptyline  10 MG tablet Commonly known as: ELAVIL  Take 1 tablet (10 mg total) by mouth at bedtime.   aspirin  EC 81 MG tablet Take 81 mg by mouth daily. Swallow whole.   atorvastatin  80 MG tablet Commonly known as: LIPITOR Take 1 tablet (80 mg total) by mouth daily.   clopidogrel  75 MG tablet Commonly known as: PLAVIX  Take 1 tablet (75 mg total) by mouth daily.   desvenlafaxine  25 MG 24 hr tablet Commonly known as: PRISTIQ  Take 1 tablet (25 mg total) by mouth daily.   famotidine  20 MG tablet Commonly known as: PEPCID  TAKE ONE TABLET TWICE DAILY   famotidine  20 MG tablet Commonly known as: PEPCID  Take 1 tablet (20 mg total) by mouth 2 (two) times daily.   lisinopril  10 MG tablet Commonly known as: ZESTRIL  Take 1 tablet (10 mg total) by  mouth daily.   pantoprazole  40 MG tablet Commonly known as: PROTONIX  Take 40 mg by mouth daily.   polyethylene glycol powder 17 GM/SCOOP powder Commonly known as: GLYCOLAX /MIRALAX  Take 8.5 g by mouth daily.   potassium chloride  10 MEQ tablet Commonly known as: KLOR-CON  TAKE TWO TABLETS TWICE DAILY   sildenafil  50 MG tablet Commonly known as: Viagra  Take 1 tablet (50 mg total) by mouth daily as needed for erectile dysfunction.        Allergies: Allergies[1]  Family History: Family History  Problem Relation Age of Onset   Heart disease Mother    Diabetes Father    Colon cancer Father     Social  History:  reports that he has been smoking cigarettes. He started smoking about 51 years ago. He has a 52.6 pack-year smoking history. He has never used smokeless tobacco. He reports current alcohol use. He reports current drug use. Frequency: 3.00 times per week. Drug: Marijuana.  ROS: All other review of systems were reviewed and are negative except what is noted above in HPI  Physical Exam: BP 122/72   Pulse 69   Constitutional:  Alert and oriented, No acute distress. HEENT: [place] AT, moist mucus membranes.  Trachea midline, no masses. Cardiovascular: No clubbing, cyanosis, or edema. Respiratory: Normal respiratory effort, no increased work of breathing. GI: Abdomen is soft, nontender, nondistended, no abdominal masses GU: No CVA tenderness.  Lymph: No cervical or inguinal lymphadenopathy. Skin: No rashes, bruises or suspicious lesions. Neurologic: Grossly intact, no focal deficits, moving all 4 extremities. Psychiatric: Normal mood and affect.  Laboratory Data: Lab Results  Component Value Date   WBC 9.4 05/14/2024   HGB 16.0 05/14/2024   HCT 47.3 05/14/2024   MCV 91 05/14/2024   PLT 285 05/14/2024    Lab Results  Component Value Date   CREATININE 0.98 06/17/2024    No results found for: PSA  No results found for: TESTOSTERONE  No results found for: HGBA1C  Urinalysis    Component Value Date/Time   COLORURINE YELLOW 09/26/2018 0130   APPEARANCEUR CLEAR 09/26/2018 0130   LABSPEC 1.041 (H) 09/26/2018 0130   PHURINE 5.0 09/26/2018 0130   GLUCOSEU 50 (A) 09/26/2018 0130   HGBUR SMALL (A) 09/26/2018 0130   BILIRUBINUR NEGATIVE 09/26/2018 0130   KETONESUR NEGATIVE 09/26/2018 0130   PROTEINUR NEGATIVE 09/26/2018 0130   NITRITE NEGATIVE 09/26/2018 0130   LEUKOCYTESUR NEGATIVE 09/26/2018 0130    Lab Results  Component Value Date   BACTERIA NONE SEEN 09/26/2018    Pertinent Imaging:  No results found for this or any previous visit.  No results found for  this or any previous visit.  No results found for this or any previous visit.  No results found for this or any previous visit.  No results found for this or any previous visit.  No results found for this or any previous visit.  No results found for this or any previous visit.  No results found for this or any previous visit.   Assessment & Plan:    1. Erectile dysfunction due to arterial insufficiency (Primary) -we will defer therapy at this time due to significant CAD and PVD  2. Benign prostatic hyperplasia with nocturia -we will trial uroxatral  10mg  at bedtime  - BLADDER SCAN AMB NON-IMAGING - Urinalysis, Routine w reflex microscopic  3. Nocturia Continue uroxatral  10mg  qhs   No follow-ups on file.  Belvie Clara, MD  [facility] [place]      [1] No  Known Allergies  "

## 2024-07-04 NOTE — Progress Notes (Signed)
" ° °  Patient can void prior to the bladder scan. Bladder scan result: 259  Performed By: [facility] LPN  "

## 2024-10-24 ENCOUNTER — Ambulatory Visit: Admitting: Urology

## 2024-11-19 ENCOUNTER — Encounter: Admitting: Family Medicine

## 2025-06-17 ENCOUNTER — Ambulatory Visit
